# Patient Record
Sex: Female | Born: 1980 | Race: Black or African American | Hispanic: No | Marital: Married | Smoking: Never smoker
Health system: Southern US, Community
[De-identification: ages and names within clinical notes are randomized; demographics above are authoritative.]

## PROBLEM LIST (undated history)

## (undated) ENCOUNTER — Inpatient Hospital Stay (HOSPITAL_COMMUNITY): Payer: Self-pay

## (undated) DIAGNOSIS — D649 Anemia, unspecified: Secondary | ICD-10-CM

## (undated) DIAGNOSIS — K831 Obstruction of bile duct: Secondary | ICD-10-CM

## (undated) HISTORY — PX: BREAST BIOPSY: SHX20

## (undated) HISTORY — PX: WISDOM TOOTH EXTRACTION: SHX21

## (undated) HISTORY — DX: Anemia, unspecified: D64.9

## (undated) HISTORY — PX: INDUCED ABORTION: SHX677

---
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
## 2003-01-23 ENCOUNTER — Inpatient Hospital Stay (HOSPITAL_COMMUNITY): Admission: AD | Admit: 2003-01-23 | Discharge: 2003-01-23 | Payer: Self-pay | Admitting: Obstetrics

## 2003-03-23 ENCOUNTER — Inpatient Hospital Stay (HOSPITAL_COMMUNITY): Admission: AD | Admit: 2003-03-23 | Discharge: 2003-03-23 | Payer: Self-pay | Admitting: Obstetrics

## 2003-03-23 ENCOUNTER — Encounter: Payer: Self-pay | Admitting: Obstetrics & Gynecology

## 2003-03-30 ENCOUNTER — Inpatient Hospital Stay (HOSPITAL_COMMUNITY): Admission: AD | Admit: 2003-03-30 | Discharge: 2003-04-01 | Payer: Self-pay | Admitting: Obstetrics

## 2003-04-16 ENCOUNTER — Encounter: Admission: RE | Admit: 2003-04-16 | Discharge: 2003-05-16 | Payer: Self-pay | Admitting: Obstetrics

## 2003-06-11 ENCOUNTER — Emergency Department (HOSPITAL_COMMUNITY): Admission: EM | Admit: 2003-06-11 | Discharge: 2003-06-11 | Payer: Self-pay | Admitting: Emergency Medicine

## 2006-04-07 ENCOUNTER — Ambulatory Visit: Payer: Self-pay | Admitting: Family Medicine

## 2006-04-21 ENCOUNTER — Other Ambulatory Visit: Admission: RE | Admit: 2006-04-21 | Discharge: 2006-04-21 | Payer: Self-pay | Admitting: Family Medicine

## 2006-04-21 ENCOUNTER — Ambulatory Visit: Payer: Self-pay | Admitting: Family Medicine

## 2006-04-21 ENCOUNTER — Encounter (INDEPENDENT_AMBULATORY_CARE_PROVIDER_SITE_OTHER): Payer: Self-pay | Admitting: Family Medicine

## 2006-06-06 ENCOUNTER — Ambulatory Visit: Payer: Self-pay | Admitting: Family Medicine

## 2008-12-15 ENCOUNTER — Emergency Department (HOSPITAL_BASED_OUTPATIENT_CLINIC_OR_DEPARTMENT_OTHER): Admission: EM | Admit: 2008-12-15 | Discharge: 2008-12-15 | Payer: Self-pay | Admitting: Emergency Medicine

## 2009-05-16 ENCOUNTER — Emergency Department (HOSPITAL_COMMUNITY): Admission: EM | Admit: 2009-05-16 | Discharge: 2009-05-16 | Payer: Self-pay | Admitting: Emergency Medicine

## 2009-07-17 ENCOUNTER — Ambulatory Visit: Payer: Self-pay | Admitting: Family Medicine

## 2009-12-25 ENCOUNTER — Emergency Department (HOSPITAL_COMMUNITY): Admission: EM | Admit: 2009-12-25 | Discharge: 2009-12-25 | Payer: Self-pay | Admitting: Emergency Medicine

## 2010-01-26 ENCOUNTER — Emergency Department (HOSPITAL_COMMUNITY): Admission: EM | Admit: 2010-01-26 | Discharge: 2010-01-26 | Payer: Self-pay | Admitting: Family Medicine

## 2010-03-09 ENCOUNTER — Emergency Department (HOSPITAL_COMMUNITY): Admission: EM | Admit: 2010-03-09 | Discharge: 2010-03-09 | Payer: Self-pay | Admitting: Emergency Medicine

## 2010-09-15 ENCOUNTER — Emergency Department (HOSPITAL_COMMUNITY)
Admission: EM | Admit: 2010-09-15 | Discharge: 2010-09-15 | Disposition: A | Discharge status: Home / Self Care | Payer: Self-pay | Attending: Emergency Medicine | Admitting: Emergency Medicine

## 2010-09-15 ENCOUNTER — Emergency Department (HOSPITAL_COMMUNITY): Payer: Self-pay

## 2010-09-15 DIAGNOSIS — M79609 Pain in unspecified limb: Secondary | ICD-10-CM | POA: Insufficient documentation

## 2010-09-15 DIAGNOSIS — IMO0002 Reserved for concepts with insufficient information to code with codable children: Secondary | ICD-10-CM | POA: Insufficient documentation

## 2010-09-15 IMAGING — CR DG FOOT COMPLETE 3+V*R*
3 series · 3 of 3 positions shown · non-contrast
Comparison: None.

CLINICAL DATA: Chronic right foot pain.  Heel pain.

RIGHT FOOT COMPLETE - 3+ VIEW

[t foot ap right]
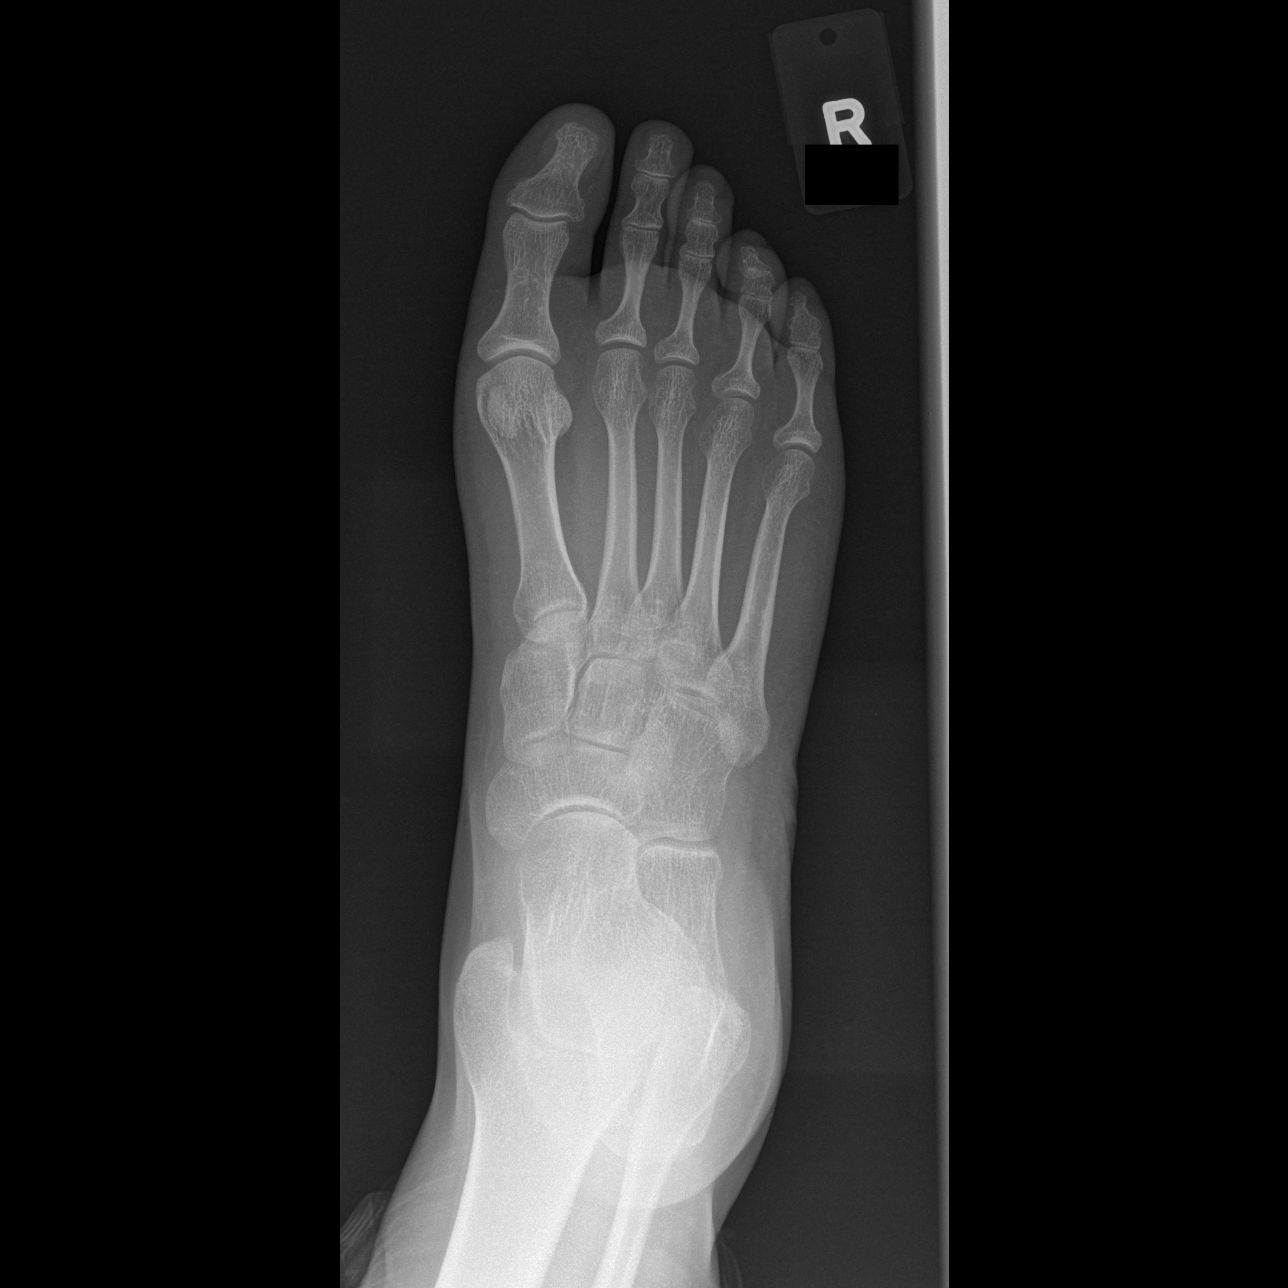

[t foot oblique right]
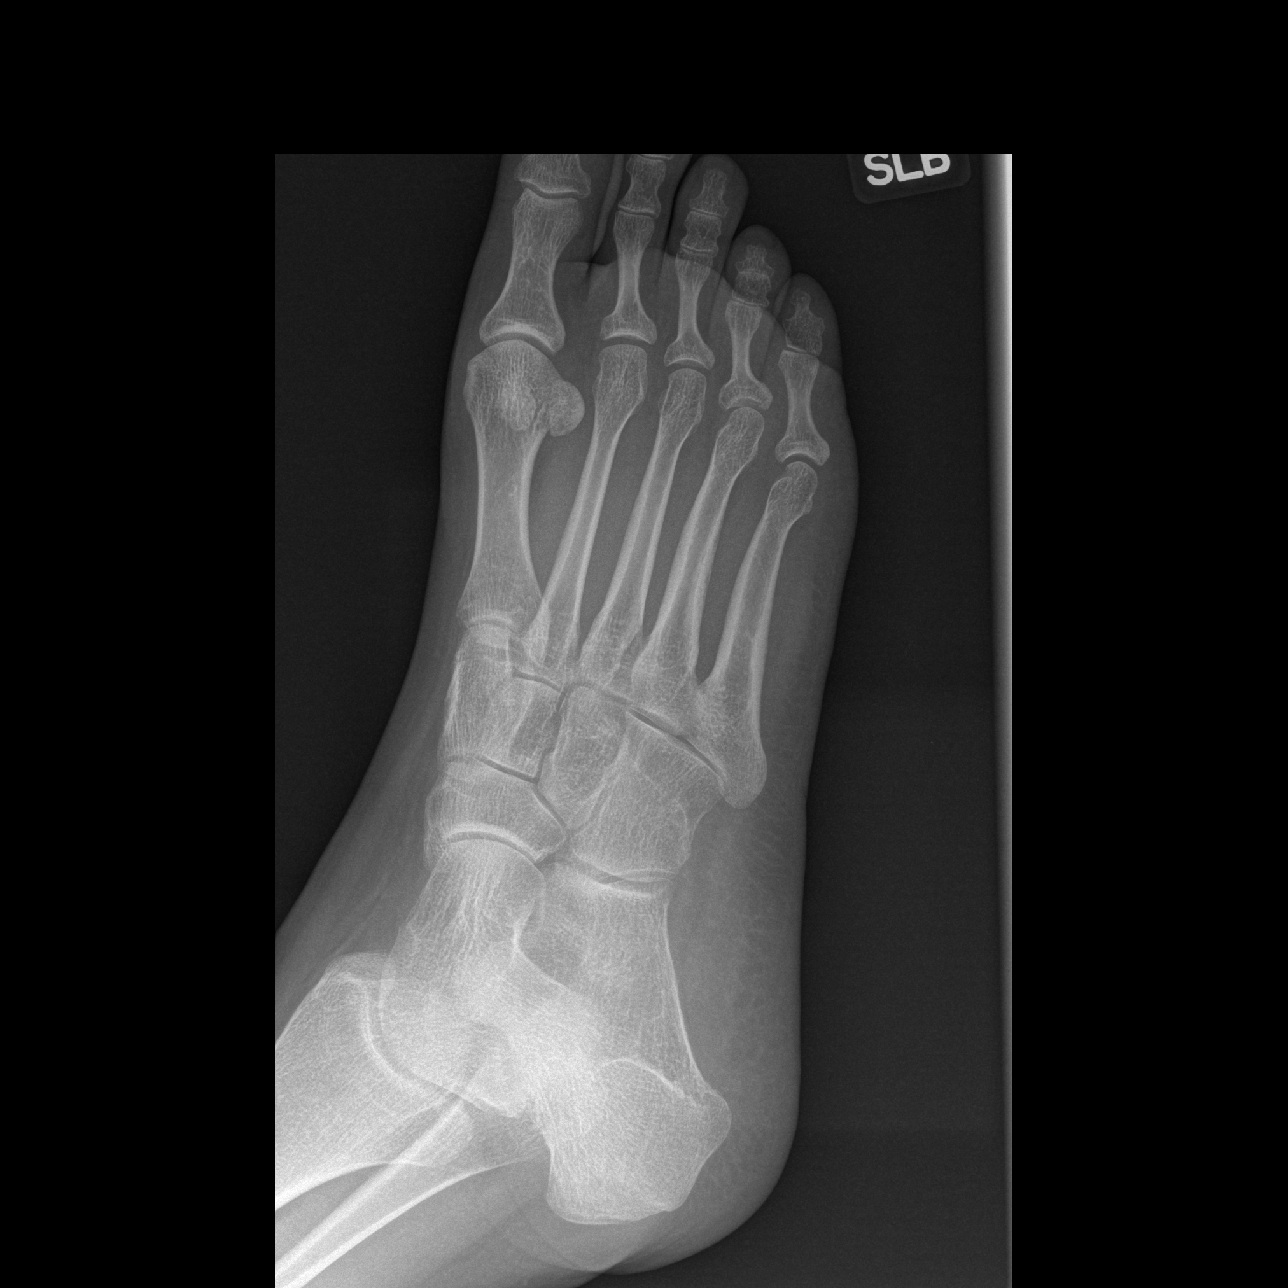

[t foot lat right]
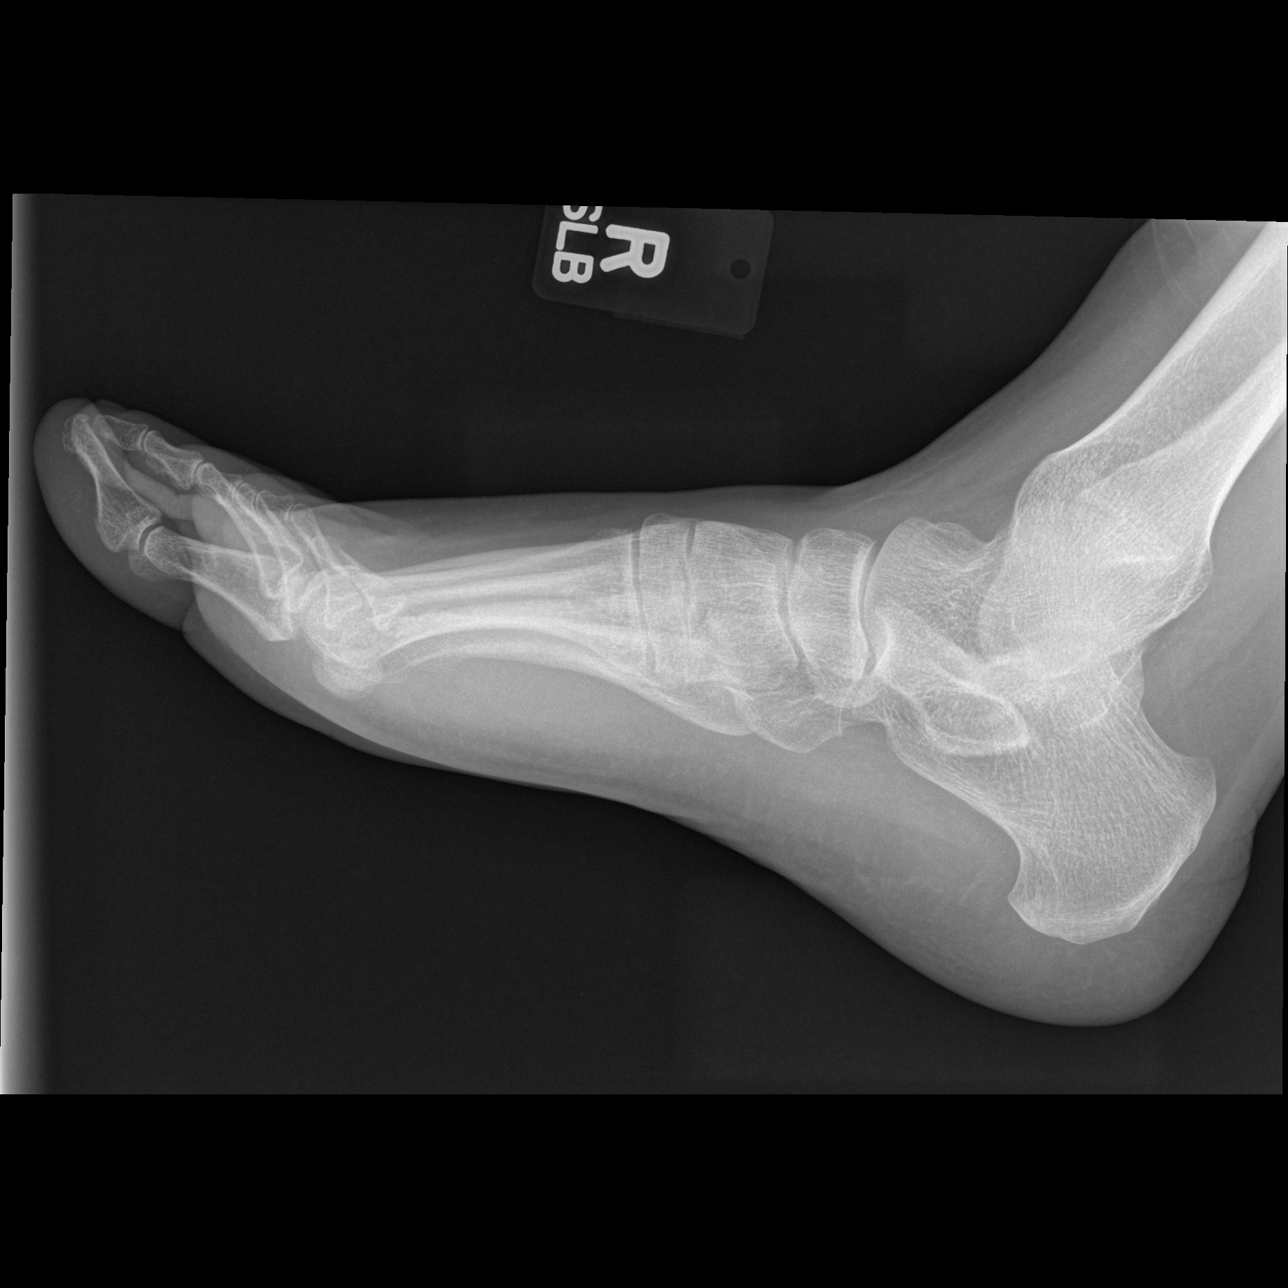

[3 of 3 positions shown; findings below may reference images not displayed]

FINDINGS: No acute osseous, soft tissue or joint abnormality.  No
radiopaque foreign body.
IMPRESSION: No acute findings.

## 2010-09-16 LAB — CBC
Platelets: 333 10*3/uL (ref 150–400)
RDW: 15.1 % (ref 11.5–15.5)
WBC: 11.5 10*3/uL — ABNORMAL HIGH (ref 4.0–10.5)

## 2010-09-16 LAB — URINALYSIS, ROUTINE W REFLEX MICROSCOPIC
Bilirubin Urine: NEGATIVE
Glucose, UA: NEGATIVE mg/dL
Ketones, ur: 15 mg/dL — AB
Leukocytes, UA: NEGATIVE
Nitrite: NEGATIVE
Protein, ur: NEGATIVE mg/dL
Specific Gravity, Urine: 1.03 (ref 1.005–1.030)
Urobilinogen, UA: 0.2 mg/dL (ref 0.0–1.0)
pH: 6 (ref 5.0–8.0)

## 2010-09-16 LAB — URINE MICROSCOPIC-ADD ON

## 2010-09-16 LAB — POCT I-STAT, CHEM 8
BUN: 9 mg/dL (ref 6–23)
Chloride: 103 mEq/L (ref 96–112)
Sodium: 140 mEq/L (ref 135–145)

## 2010-09-16 LAB — POCT PREGNANCY, URINE: Preg Test, Ur: NEGATIVE

## 2010-09-16 LAB — DIFFERENTIAL
Basophils Absolute: 0 10*3/uL (ref 0.0–0.1)
Lymphocytes Relative: 16 % (ref 12–46)
Neutro Abs: 8.7 10*3/uL — ABNORMAL HIGH (ref 1.7–7.7)
Neutrophils Relative %: 76 % (ref 43–77)

## 2010-09-19 LAB — RAPID STREP SCREEN (MED CTR MEBANE ONLY): Streptococcus, Group A Screen (Direct): POSITIVE — AB

## 2011-02-14 ENCOUNTER — Emergency Department (HOSPITAL_COMMUNITY)
Admission: EM | Admit: 2011-02-14 | Discharge: 2011-02-14 | Disposition: A | Discharge status: Home / Self Care | Payer: Self-pay | Attending: Emergency Medicine | Admitting: Emergency Medicine

## 2011-02-14 DIAGNOSIS — R51 Headache: Secondary | ICD-10-CM | POA: Insufficient documentation

## 2011-02-14 DIAGNOSIS — R42 Dizziness and giddiness: Secondary | ICD-10-CM | POA: Insufficient documentation

## 2011-02-14 LAB — URINALYSIS, ROUTINE W REFLEX MICROSCOPIC
Ketones, ur: NEGATIVE mg/dL
Leukocytes, UA: NEGATIVE
Nitrite: NEGATIVE
Specific Gravity, Urine: 1.017 (ref 1.005–1.030)
Urobilinogen, UA: 0.2 mg/dL (ref 0.0–1.0)
pH: 7.5 (ref 5.0–8.0)

## 2011-02-14 LAB — POCT I-STAT, CHEM 8
Calcium, Ion: 1.19 mmol/L (ref 1.12–1.32)
Chloride: 107 mEq/L (ref 96–112)
HCT: 36 % (ref 36.0–46.0)
Hemoglobin: 12.2 g/dL (ref 12.0–15.0)
Potassium: 3.7 mEq/L (ref 3.5–5.1)

## 2011-02-14 LAB — URINE MICROSCOPIC-ADD ON

## 2011-02-14 LAB — POCT PREGNANCY, URINE: Preg Test, Ur: NEGATIVE

## 2011-06-03 DIAGNOSIS — R071 Chest pain on breathing: Secondary | ICD-10-CM | POA: Insufficient documentation

## 2011-06-03 DIAGNOSIS — R079 Chest pain, unspecified: Secondary | ICD-10-CM | POA: Insufficient documentation

## 2011-06-04 ENCOUNTER — Emergency Department (HOSPITAL_COMMUNITY): Payer: Self-pay

## 2011-06-04 ENCOUNTER — Emergency Department (HOSPITAL_COMMUNITY)
Admission: EM | Admit: 2011-06-04 | Discharge: 2011-06-04 | Disposition: A | Discharge status: Home / Self Care | Payer: Self-pay | Attending: Emergency Medicine | Admitting: Emergency Medicine

## 2011-06-04 ENCOUNTER — Encounter: Payer: Self-pay | Admitting: *Deleted

## 2011-06-04 DIAGNOSIS — R0789 Other chest pain: Secondary | ICD-10-CM

## 2011-06-04 IMAGING — CR DG CHEST 2V
2 series · 2 of 2 positions shown · non-contrast
Comparison: None.

CLINICAL DATA: Mid and left-sided chest pain for 1 month.

CHEST - 2 VIEW

[w chest pa]
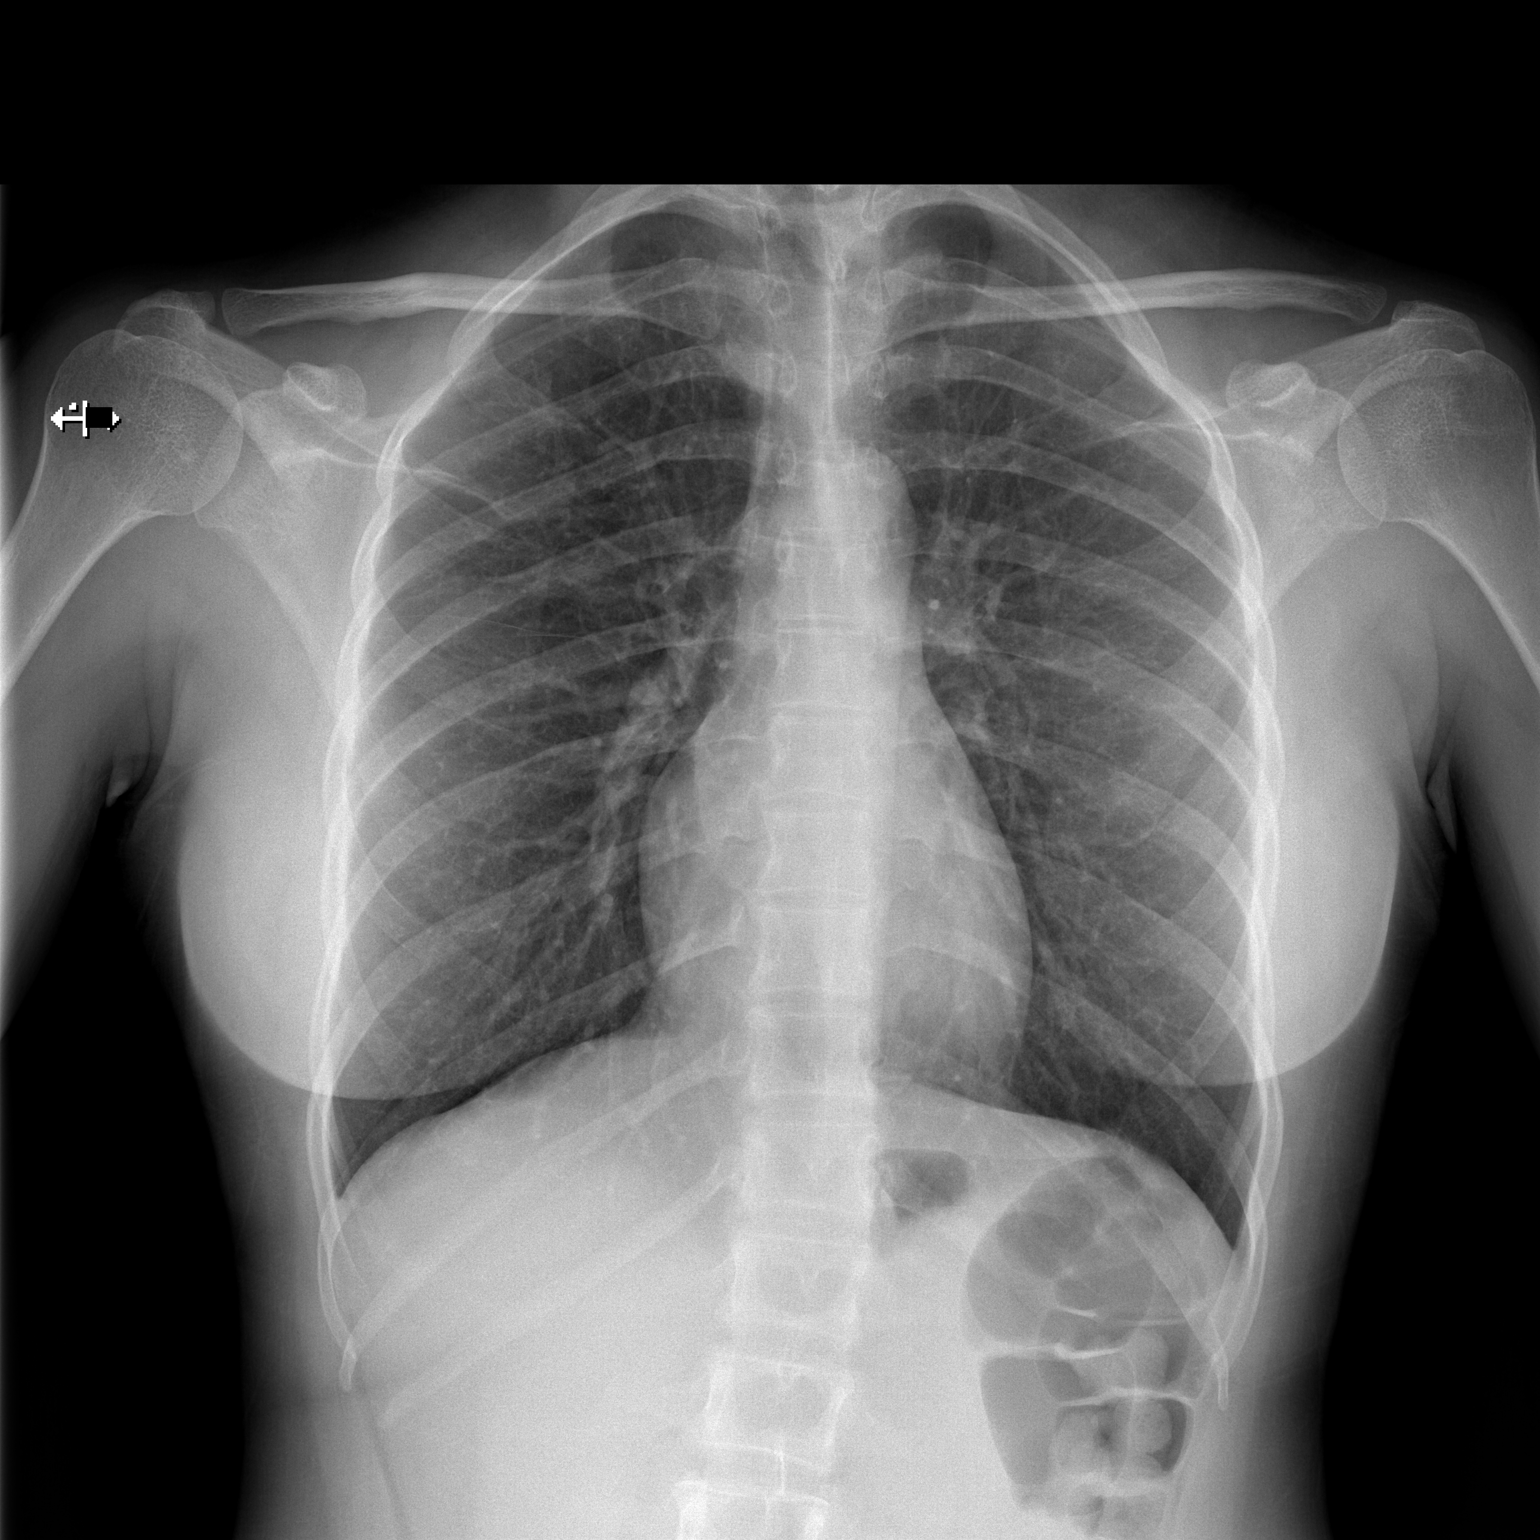

[w chest lat]
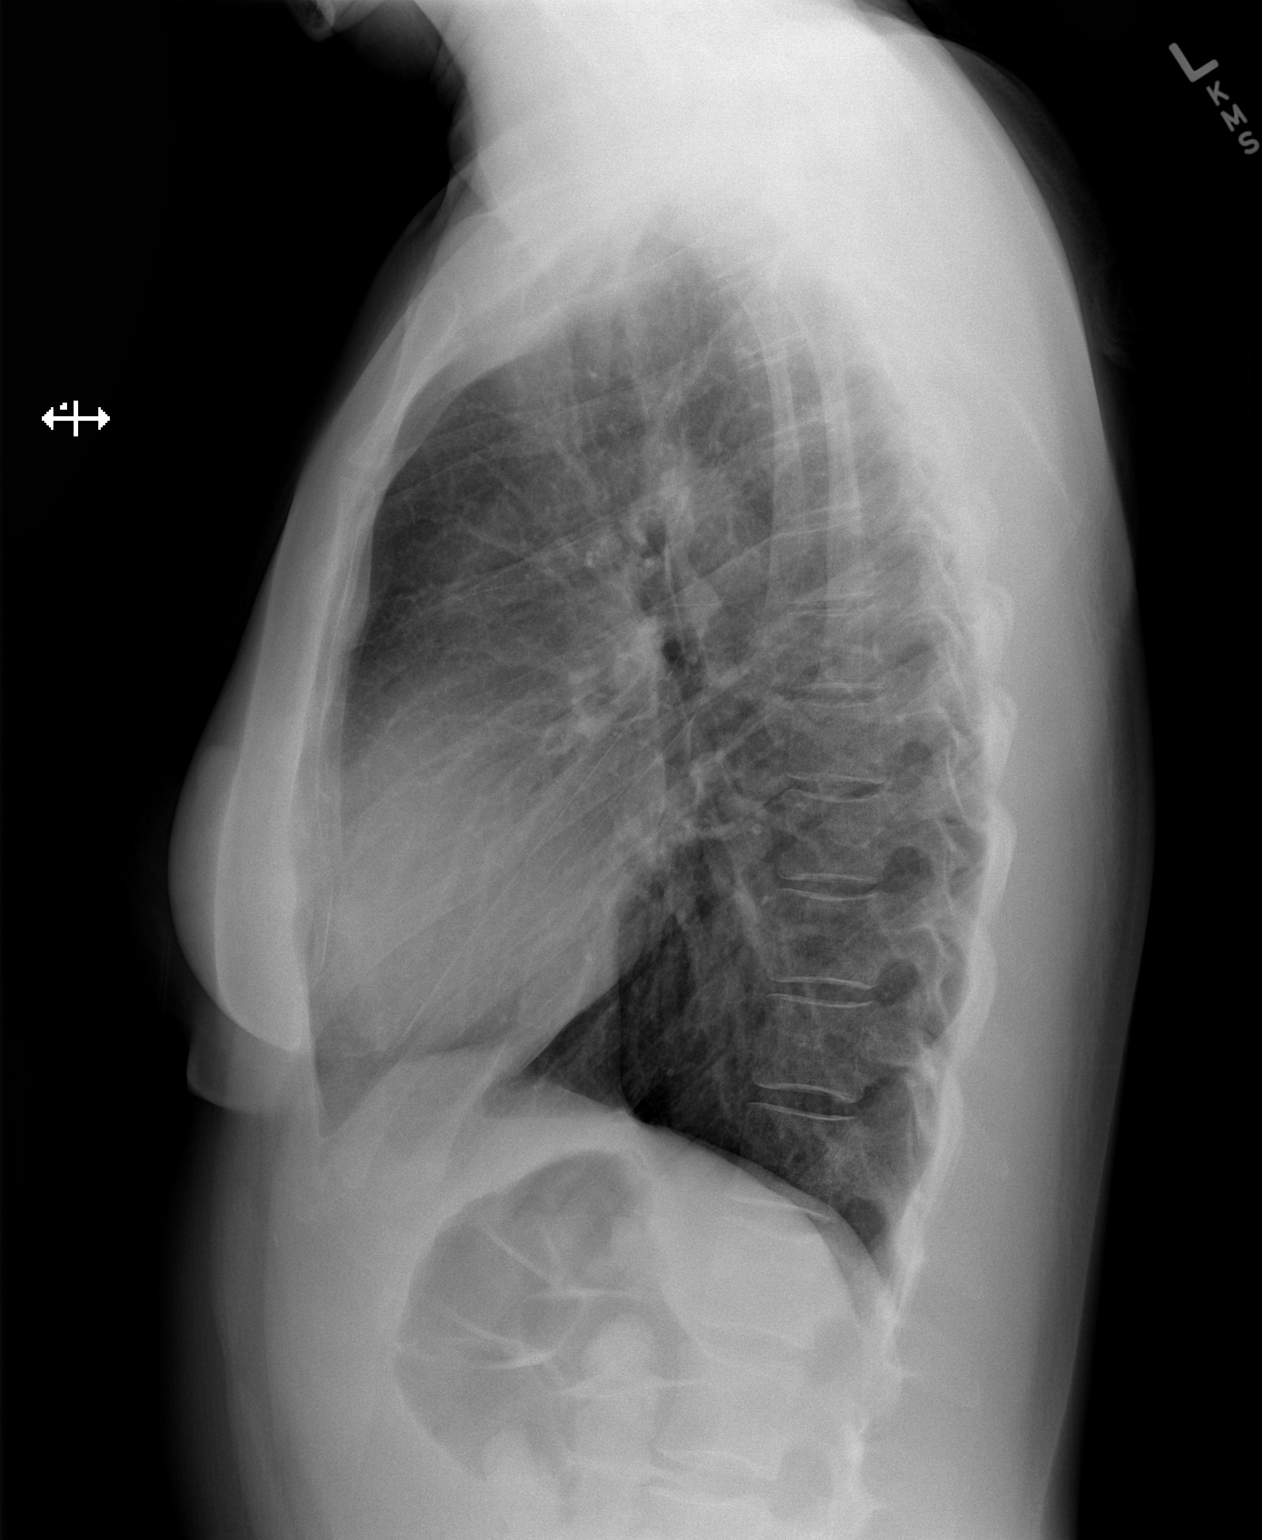

[2 of 2 positions shown; findings below may reference images not displayed]

FINDINGS: The lungs are well-aerated and clear.  There is no
evidence of focal opacification, pleural effusion or pneumothorax.

The heart is normal in size; the mediastinal contour is within
normal limits.  No acute osseous abnormalities are seen.
IMPRESSION: No acute cardiopulmonary process seen.

## 2011-06-04 MED ORDER — OMEPRAZOLE 20 MG PO CPDR: DELAYED_RELEASE_CAPSULE | ORAL | Status: DC

## 2011-06-04 NOTE — ED Notes (Signed)
PT c/o Chest pain starting x 1 mon ago, intermittant, worse when lying down, denies any other problems.

## 2011-06-04 NOTE — ED Provider Notes (Signed)
History     CSN: [id number] Arrival date & time: 06/04/2011 12:39 AM   First MD Initiated Contact with Patient 06/04/11 0125      Chief Complaint  Patient presents with  . Chest Pain    Pt states sharp, in no acute distress at this time, pain free at this time, states x 1 month of intermittant CP, worse when lying down.    (Consider location/radiation/quality/duration/timing/severity/associated sxs/prior treatment) Patient is a 30 y.o. female presenting with chest pain. The history is provided by the patient.  Chest Pain The chest pain began more  than 1 month ago. Duration of episode(s) is 3 seconds. Chest pain occurs frequently. The chest pain is unchanged. The quality of the pain is described as sharp. The pain does not radiate. Pertinent negatives for primary symptoms include no fever. Primary symptoms comment: No fever, cough or known injury. It is not associated with eating. She does not currently have pain.     History reviewed. No pertinent past medical history.  Past Surgical History  Procedure Date  . Breast biopsy     Fibroid tissue    History reviewed. No pertinent family history.  History  Substance Use Topics  . Smoking status: Former Games developer  . Smokeless tobacco: Not on file  . Alcohol Use: No    OB History    Grav Para Term Preterm Abortions TAB SAB Ect Mult Living                  Review of Systems  Constitutional: Negative.  Negative for fever and chills.  HENT: Negative.   Respiratory: Negative.   Cardiovascular: Positive for chest pain.  Gastrointestinal: Negative.   Musculoskeletal: Negative.   Skin: Negative.   Neurological: Negative.     Allergies  Review of patient's allergies indicates no known allergies.  Home Medications  No current outpatient prescriptions on file.  BP 103/73  Pulse 77  Temp(Src) 98 F (36.7 C) (Oral)  Resp 16  SpO2 100%  LMP 05/30/2011  Physical Exam  Constitutional: She appears well-developed and  well-nourished.  HENT:  Head: Normocephalic.  Neck: Normal range of motion. Neck supple.  Cardiovascular: Normal rate and regular rhythm.   Pulmonary/Chest: Effort normal and breath sounds normal. She exhibits no tenderness.  Abdominal: Soft. Bowel sounds are normal. There is no tenderness. There is no rebound and no guarding.  Musculoskeletal: Normal range of motion.  Neurological: She is alert. No cranial nerve deficit.  Skin: Skin is warm and dry. No rash noted.  Psychiatric: She has a normal mood and affect.    ED Course  Procedures (including critical care time)  Labs Reviewed - No data to display No results found.   No diagnosis found.    MDM          Rodena Medin, PA 06/04/11 [phone]

## 2011-06-05 NOTE — ED Provider Notes (Signed)
Medical screening examination/treatment/procedure(s) were performed by non-physician practitioner and as supervising physician I was immediately available for consultation/collaboration.   Gerhard Munch, MD 06/05/11 [phone]

## 2012-08-24 ENCOUNTER — Emergency Department (HOSPITAL_COMMUNITY)
Admission: EM | Admit: 2012-08-24 | Discharge: 2012-08-24 | Disposition: A | Discharge status: Home / Self Care | Payer: Self-pay | Attending: Emergency Medicine | Admitting: Emergency Medicine

## 2012-08-24 ENCOUNTER — Encounter (HOSPITAL_COMMUNITY): Payer: Self-pay | Admitting: *Deleted

## 2012-08-24 DIAGNOSIS — H9209 Otalgia, unspecified ear: Secondary | ICD-10-CM | POA: Insufficient documentation

## 2012-08-24 DIAGNOSIS — R05 Cough: Secondary | ICD-10-CM | POA: Insufficient documentation

## 2012-08-24 DIAGNOSIS — R059 Cough, unspecified: Secondary | ICD-10-CM | POA: Insufficient documentation

## 2012-08-24 DIAGNOSIS — J029 Acute pharyngitis, unspecified: Secondary | ICD-10-CM | POA: Insufficient documentation

## 2012-08-24 DIAGNOSIS — Z87891 Personal history of nicotine dependence: Secondary | ICD-10-CM | POA: Insufficient documentation

## 2012-08-24 MED ORDER — DEXAMETHASONE 4 MG PO TABS
8.0000 mg | ORAL_TABLET | Freq: Once | ORAL | Status: AC
  Administered 2012-08-24: 8 mg via ORAL
  Filled 2012-08-24: qty 2

## 2012-08-24 MED ORDER — OXYCODONE-ACETAMINOPHEN 5-325 MG PO TABS: 1.0000 | ORAL_TABLET | ORAL | Status: DC | PRN

## 2012-08-24 MED ORDER — NAPROXEN 250 MG PO TABS: 250.0000 mg | ORAL_TABLET | Freq: Three times a day (TID) | ORAL | Status: DC | PRN

## 2012-08-24 NOTE — ED Notes (Addendum)
Pt reports sore throat x3 days, pain 7/10, reports white patches on back of throat. Has tonsils present. Pt reports she gets strep every year. Reports dry non productive cough x1 day. Nasal congestion starting. Reports tired and "drained, cant get enough rest".

## 2012-08-24 NOTE — ED Notes (Signed)
md at bedside  Pt alert and oriented x4. Respirations even and unlabored, bilateral symmetrical rise and fall of chest. Skin warm and dry. In no acute distress. Denies needs.   

## 2012-08-24 NOTE — ED Notes (Signed)
Pt escorted to discharge window. Pt verbalized understanding discharge instructions. In no acute distress.  

## 2012-08-27 NOTE — ED Provider Notes (Signed)
History    31yf with sore throat. Gradual onset about 3d ago. Relatively constant. Worse with swallowing. No fever or chills. B/l ear pain. No drainage. occasional non productive cough. No rash. Has tried taking motrin with mild relief. No cp or sob. No n/v.  CSN: [id number]  Arrival date & time 08/24/12  0803   First MD Initiated Contact with Patient 08/24/12 [phone]      Chief Complaint  Patient presents with  . Sore Throat    (Consider location/radiation/quality/duration/timing/severity/associated sxs/prior treatment) HPI  History reviewed. No pertinent past medical history.  Past Surgical History  Procedure Laterality Date  . Breast biopsy      Fibroid tissue 2003    History reviewed. No pertinent family history.  History  Substance Use Topics  . Smoking status: Former Games developer  . Smokeless tobacco: Not on file  . Alcohol Use: Yes     Comment: occasionally glass of wine    OB History   Grav Para Term Preterm Abortions TAB SAB Ect Mult Living                  Review of Systems  All systems reviewed and negative, other than as noted in HPI.   Allergies  Review of patient's allergies indicates no known allergies.  Home Medications   Current Outpatient Rx  Name  Route  Sig  Dispense  Refill  . naproxen (NAPROSYN) 250 MG tablet   Oral   Take 1 tablet (250 mg total) by mouth 3 (three) times daily as needed.   20 tablet   0   . omeprazole (PRILOSEC) 20 MG capsule      One twice daily for 3 days, then once daily for 7 days.   15 capsule   0   . oxyCODONE-acetaminophen (PERCOCET/ROXICET) 5-325 MG per tablet   Oral   Take 1 tablet by mouth every 4 (four) hours as needed for pain.   10 tablet   0     BP 111/73  Pulse 97  Temp(Src) 98.4 F (36.9 C) (Oral)  Resp 16  SpO2 99%  LMP 08/20/2012  Physical Exam  Nursing note and vitals reviewed. Constitutional: She appears well-developed and well-nourished. No distress.  HENT:  Head: Normocephalic and  atraumatic.  Right Ear: External ear normal.  Left Ear: External ear normal.  Mouth/Throat: Oropharyngeal exudate present.  Posterior pharynx with exudate. Uvula midline. Handling secretions. Normal sounding phonation. Tongue elevation. Submental tissue soft. Neck is supple. No adenopathy. No stridor.  Eyes: Conjunctivae are normal. Right eye exhibits no discharge. Left eye exhibits no discharge.  Neck: Neck supple.  Cardiovascular: Normal rate, regular rhythm and normal heart sounds.  Exam reveals no gallop and no friction rub.   No murmur heard. Pulmonary/Chest: Effort normal and breath sounds normal. No respiratory distress.  Abdominal: Soft. She exhibits no distension. There is no tenderness.  Musculoskeletal: She exhibits no edema and no tenderness.  Neurological: She is alert.  Skin: Skin is warm and dry.  Psychiatric: She has a normal mood and affect. Her behavior is normal. Thought content normal.    ED Course  Procedures (including critical care time)  Labs Reviewed - No data to display No results found.   1. Pharyngitis       MDM  31yF with sore throat. Likely viral pharyngitis. 1/4 centor criteria. Well appearing. No evidence of deep space head/neck infection. Plan symptomatic tx. Emergent return precautions discussed.         Raeford Razor,  MD 08/27/12 1532

## 2012-11-21 ENCOUNTER — Encounter (HOSPITAL_COMMUNITY): Payer: Self-pay | Admitting: *Deleted

## 2012-11-21 ENCOUNTER — Emergency Department (HOSPITAL_COMMUNITY)
Admission: EM | Admit: 2012-11-21 | Discharge: 2012-11-21 | Disposition: A | Discharge status: Home / Self Care | Payer: Self-pay | Attending: Emergency Medicine | Admitting: Emergency Medicine

## 2012-11-21 DIAGNOSIS — R5381 Other malaise: Secondary | ICD-10-CM | POA: Insufficient documentation

## 2012-11-21 DIAGNOSIS — Z3202 Encounter for pregnancy test, result negative: Secondary | ICD-10-CM | POA: Insufficient documentation

## 2012-11-21 DIAGNOSIS — R0602 Shortness of breath: Secondary | ICD-10-CM | POA: Insufficient documentation

## 2012-11-21 DIAGNOSIS — R110 Nausea: Secondary | ICD-10-CM | POA: Insufficient documentation

## 2012-11-21 DIAGNOSIS — Z87891 Personal history of nicotine dependence: Secondary | ICD-10-CM | POA: Insufficient documentation

## 2012-11-21 DIAGNOSIS — E860 Dehydration: Secondary | ICD-10-CM | POA: Insufficient documentation

## 2012-11-21 DIAGNOSIS — R531 Weakness: Secondary | ICD-10-CM

## 2012-11-21 DIAGNOSIS — R002 Palpitations: Secondary | ICD-10-CM | POA: Insufficient documentation

## 2012-11-21 DIAGNOSIS — R55 Syncope and collapse: Secondary | ICD-10-CM | POA: Insufficient documentation

## 2012-11-21 LAB — BASIC METABOLIC PANEL
BUN: 10 mg/dL (ref 6–23)
Calcium: 9.7 mg/dL (ref 8.4–10.5)
Creatinine, Ser: 0.78 mg/dL (ref 0.50–1.10)
GFR calc Af Amer: >90 mL/min (ref >90)
GFR calc non Af Amer: >90 mL/min (ref >90)

## 2012-11-21 LAB — URINALYSIS, ROUTINE W REFLEX MICROSCOPIC
Bilirubin Urine: NEGATIVE
Ketones, ur: NEGATIVE mg/dL
Leukocytes, UA: NEGATIVE
Nitrite: NEGATIVE
Protein, ur: NEGATIVE mg/dL

## 2012-11-21 LAB — CBC WITH DIFFERENTIAL/PLATELET
Basophils Absolute: 0 10*3/uL (ref 0.0–0.1)
Basophils Relative: 0 % (ref 0–1)
Eosinophils Absolute: 0.2 10*3/uL (ref 0.0–0.7)
Eosinophils Relative: 2 % (ref 0–5)
HCT: 36.8 % (ref 36.0–46.0)
MCH: 28.8 pg (ref 26.0–34.0)
MCHC: 32.6 g/dL (ref 30.0–36.0)
MCV: 88.2 fL (ref 78.0–100.0)
Monocytes Absolute: 0.9 10*3/uL (ref 0.1–1.0)
Monocytes Relative: 9 % (ref 3–12)
Neutro Abs: 5.5 10*3/uL (ref 1.7–7.7)
RDW: 14.5 % (ref 11.5–15.5)

## 2012-11-21 MED ORDER — METOCLOPRAMIDE HCL 10 MG PO TABS: 10.0000 mg | ORAL_TABLET | Freq: Four times a day (QID) | ORAL | Status: DC | PRN

## 2012-11-21 MED ORDER — SODIUM CHLORIDE 0.9 % IV BOLUS (SEPSIS)
2000.0000 mL | Freq: Once | INTRAVENOUS | Status: AC
  Administered 2012-11-21: 2000 mL via INTRAVENOUS

## 2012-11-21 MED ORDER — METOCLOPRAMIDE HCL 5 MG/ML IJ SOLN
10.0000 mg | Freq: Once | INTRAMUSCULAR | Status: AC
  Administered 2012-11-21: 10 mg via INTRAVENOUS
  Filled 2012-11-21: qty 2

## 2012-11-21 MED ORDER — SODIUM CHLORIDE 0.9 % IV BOLUS (SEPSIS)
2000.0000 mL | Freq: Once | INTRAVENOUS | Status: DC
Start: 2012-11-21 — End: 2012-11-21

## 2012-11-21 NOTE — ED Notes (Signed)
Patient stating she felt a "rush" after Reglan, pt HR 88, 100% on RA.  Pt stated the feeling quickly left.  No other complaints at this time

## 2012-11-21 NOTE — ED Notes (Signed)
Pt states she still feels dizziness and that her heart is beating fast when she gets "up and moving." Pt a/o x 4. No acute distress. Pt denies chest pain, nausea or shortness of breath. Pt states she drove herself here.

## 2012-11-21 NOTE — ED Provider Notes (Signed)
History     CSN: [id number]  Arrival date & time 11/21/12  1647   First MD Initiated Contact with Patient 11/21/12 1903      Chief Complaint  Patient presents with  . Fatigue  . Near Syncope  . Palpitations  . Shortness of Breath    (Consider location/radiation/quality/duration/timing/severity/associated sxs/prior treatment) The history is provided by the patient.  Sarah Parks is a 32 y.o. female otherwise healthy here presenting with fatigue and palpitations and shortness of breath. She's been feeling tired for about 2 weeks. 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 He just feels that she's been more tired while at work. She also feels some palpitations and feelings that she had gone passed out when she stands up. Feels nauseous but did not vomit, denies any fevers or chills or abdominal pain. No cardiac history.    History reviewed. No pertinent past medical history.  Past Surgical History  Procedure Laterality Date  . Breast biopsy      Fibroid tissue 2003    History reviewed. No pertinent family history.  History  Substance Use Topics  . Smoking status: Former Smoker    Quit date: 11/22/2007  . Smokeless tobacco: Never Used  . Alcohol Use: Yes     Comment: occasionally glass of wine    OB History   Grav Para Term Preterm Abortions TAB SAB Ect Mult Living                  Review of Systems  Respiratory: Positive for shortness of breath.   Cardiovascular: Positive for palpitations.  All other systems reviewed and are negative.    Allergies  Review of patient's allergies indicates no known allergies.  Home Medications  No current outpatient prescriptions on file.  BP 108/75  Pulse 97  Temp(Src) 98.5 F (36.9 C) (Oral)  Resp 18  SpO2 100%  LMP 11/07/2012  Physical Exam  Nursing note and vitals reviewed. Constitutional: She is oriented to person, place, and time. She appears well-developed and well-nourished.   HENT:  Head: Normocephalic.  Mouth/Throat: Oropharynx is clear and moist.  Eyes: Conjunctivae are normal. Pupils are equal, round, and reactive to light.  Neck: Normal range of motion. Neck supple.  Cardiovascular: Normal rate, regular rhythm and normal heart sounds.   Pulmonary/Chest: Effort normal and breath sounds normal. No respiratory distress. She has no wheezes. She has no rales.  Abdominal: Soft. Bowel sounds are normal. She exhibits no distension. There is no tenderness. There is no rebound and no guarding.  Musculoskeletal: Normal range of motion.  Neurological: She is alert and oriented to person, place, and time.  Skin: Skin is warm and dry.  Psychiatric: She has a normal mood and affect. Her behavior is normal. Judgment and thought content normal.    ED Course  Procedures (including critical care time)  Labs Reviewed  URINALYSIS, ROUTINE W REFLEX MICROSCOPIC - Abnormal; Notable for the following:    Specific Gravity, Urine 1.031 (*)    All other components within normal limits  CBC WITH DIFFERENTIAL  BASIC METABOLIC PANEL  PREGNANCY, URINE  HCG, QUANTITATIVE, PREGNANCY   No results found.   No diagnosis found.   Date: 11/21/2012  Rate: 83  Rhythm: normal sinus rhythm  QRS Axis: normal  Intervals: normal  ST/T Wave abnormalities: normal  Conduction Disutrbances:none  Narrative Interpretation:   Old EKG Reviewed: none available    MDM  [name] is a  32 y.o. female here with fatigue, palpitations, near syncope. Will need to r/o pregnancy vs dehydration. She is more stress out than usual so can be stress related. Labs unremarkable. Will get orthostatic and hydrate patient. I am not concerned for ACS or cardiac causes.   9:40 PM Patient not orthostatic. Labs unremarkable. UA concentrated so she may be dehydrated. Patient given 2 L NS and reglan and felt better. I told her she should stay hydrated. I recommend that she f/u with a doctor and get thyroid  test outpatient if not feeling better.          Richardean Canal, MD 11/21/12 2141

## 2012-11-21 NOTE — ED Notes (Signed)
Pt from home with reports of taking a 6 mile walk 1 1/2 weeks ago and began having fatigue, palpitations, shortness of breath and feelings of near syncope. Pt denies fever, N/V/D.

## 2014-04-09 ENCOUNTER — Inpatient Hospital Stay (HOSPITAL_COMMUNITY)
Admission: AD | Admit: 2014-04-09 | Discharge: 2014-04-09 | Disposition: A | Discharge status: Home / Self Care | Payer: Medicaid Other | Source: Ambulatory Visit | Attending: Obstetrics & Gynecology | Admitting: Obstetrics & Gynecology

## 2014-04-09 ENCOUNTER — Inpatient Hospital Stay (HOSPITAL_COMMUNITY): Payer: Medicaid Other

## 2014-04-09 ENCOUNTER — Encounter (HOSPITAL_COMMUNITY): Payer: Self-pay

## 2014-04-09 DIAGNOSIS — R109 Unspecified abdominal pain: Secondary | ICD-10-CM

## 2014-04-09 DIAGNOSIS — Z3A01 Less than 8 weeks gestation of pregnancy: Secondary | ICD-10-CM | POA: Diagnosis not present

## 2014-04-09 DIAGNOSIS — O210 Mild hyperemesis gravidarum: Secondary | ICD-10-CM | POA: Insufficient documentation

## 2014-04-09 DIAGNOSIS — O219 Vomiting of pregnancy, unspecified: Secondary | ICD-10-CM

## 2014-04-09 DIAGNOSIS — Z87891 Personal history of nicotine dependence: Secondary | ICD-10-CM | POA: Diagnosis not present

## 2014-04-09 DIAGNOSIS — O26899 Other specified pregnancy related conditions, unspecified trimester: Secondary | ICD-10-CM

## 2014-04-09 DIAGNOSIS — O9989 Other specified diseases and conditions complicating pregnancy, childbirth and the puerperium: Secondary | ICD-10-CM

## 2014-04-09 LAB — URINALYSIS, ROUTINE W REFLEX MICROSCOPIC
BILIRUBIN URINE: NEGATIVE
Glucose, UA: NEGATIVE mg/dL
Hgb urine dipstick: NEGATIVE
Ketones, ur: NEGATIVE mg/dL
Leukocytes, UA: NEGATIVE
NITRITE: NEGATIVE
Protein, ur: NEGATIVE mg/dL
SPECIFIC GRAVITY, URINE: 1.015 (ref 1.005–1.030)
UROBILINOGEN UA: 0.2 mg/dL (ref 0.0–1.0)
pH: 7.5 (ref 5.0–8.0)

## 2014-04-09 LAB — CBC
HCT: 40.3 % (ref 36.0–46.0)
HEMOGLOBIN: 13.9 g/dL (ref 12.0–15.0)
MCH: 33.4 pg (ref 26.0–34.0)
MCHC: 34.5 g/dL (ref 30.0–36.0)
MCV: 96.9 fL (ref 78.0–100.0)
Platelets: 259 10*3/uL (ref 150–400)
RBC: 4.16 MIL/uL (ref 3.87–5.11)
RDW: 13.3 % (ref 11.5–15.5)
WBC: 11.7 10*3/uL — ABNORMAL HIGH (ref 4.0–10.5)

## 2014-04-09 LAB — HCG, QUANTITATIVE, PREGNANCY: HCG, BETA CHAIN, QUANT, S: 23183 m[IU]/mL — AB (ref <5)

## 2014-04-09 LAB — POCT PREGNANCY, URINE: PREG TEST UR: POSITIVE — AB

## 2014-04-09 LAB — ABO/RH: ABO/RH(D): B POS

## 2014-04-09 IMAGING — US US OB COMP LESS 14 WK
1 series · 14 of 28 positions shown · non-contrast
Comparison: None.

CLINICAL DATA: Abdominal pain. Cramping. Nausea and vomiting.
Positive pregnancy test.

EXAM:
OBSTETRIC <14 WK US AND TRANSVAGINAL OB US
TECHNIQUE: Both transabdominal and transvaginal ultrasound examinations were
performed for complete evaluation of the gestation as well as the
maternal uterus, adnexal regions, and pelvic cul-de-sac.
Transvaginal technique was performed to assess early pregnancy.

[Series 1: us ob comp less 14 wks · 14 of 60 slices shown]
[im 3/60]
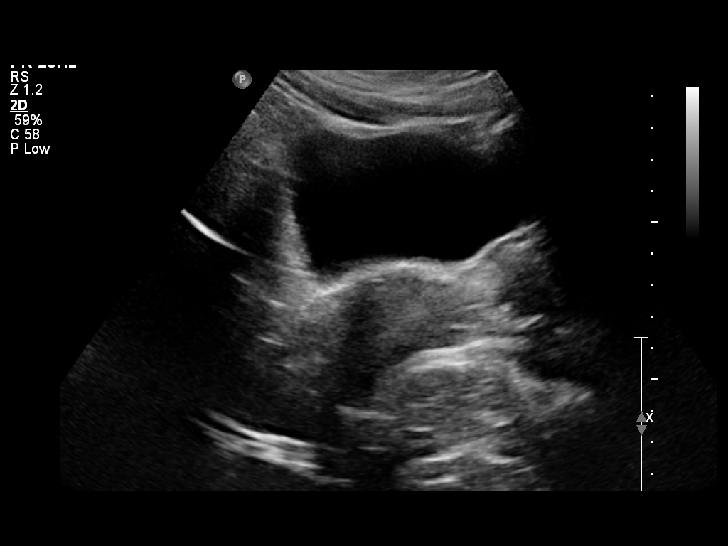
[im 7/60]
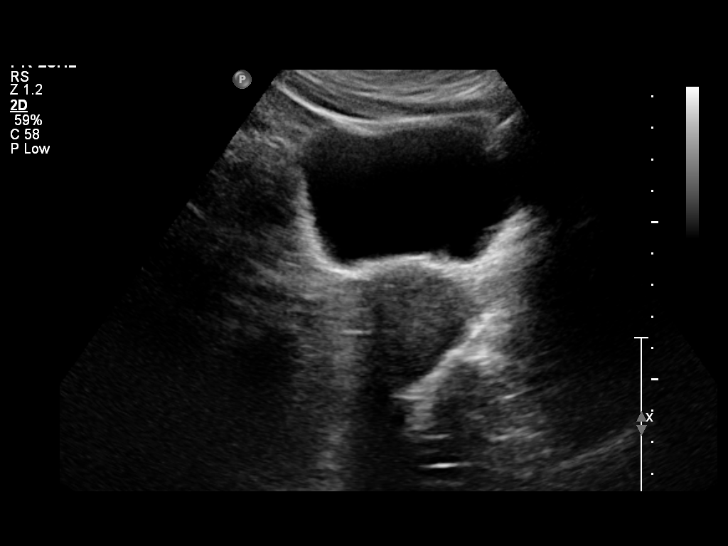
[im 11/60]
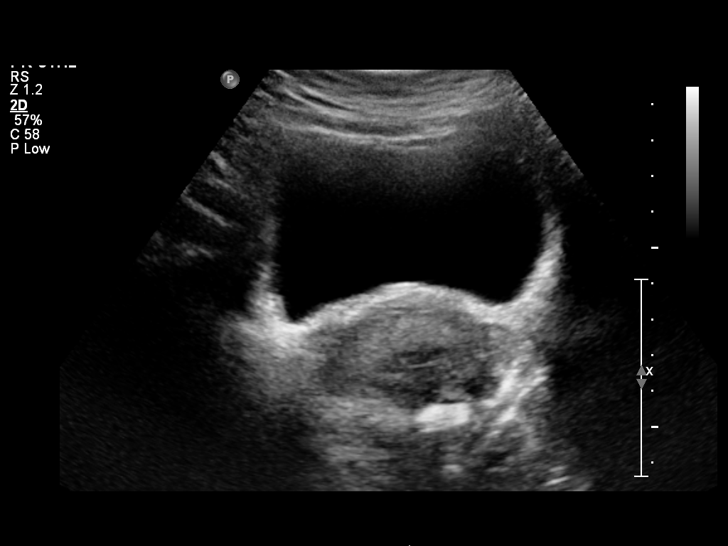
[im 16/60]
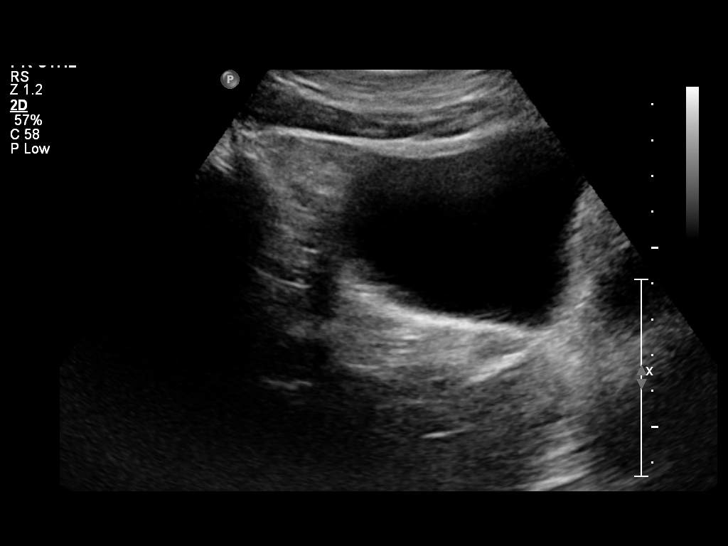
[im 20/60]
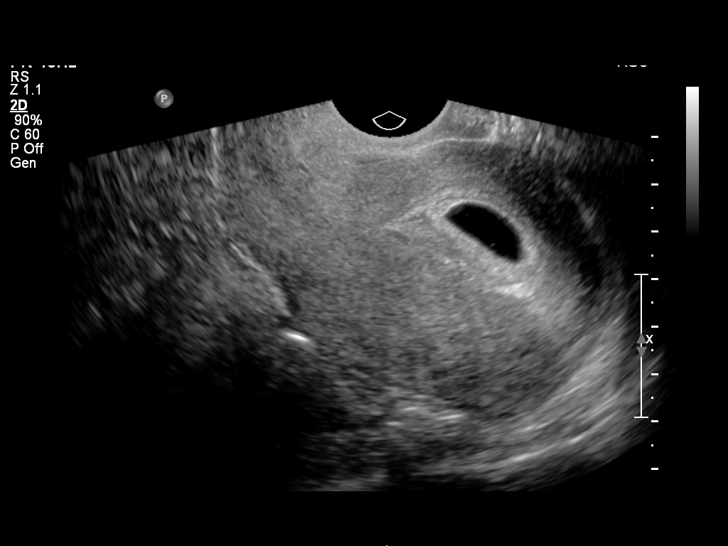
[im 25/60]
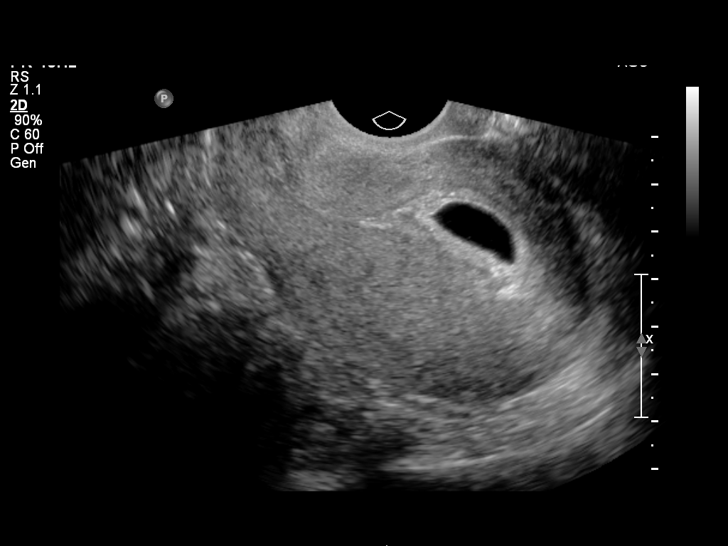
[im 29/60]
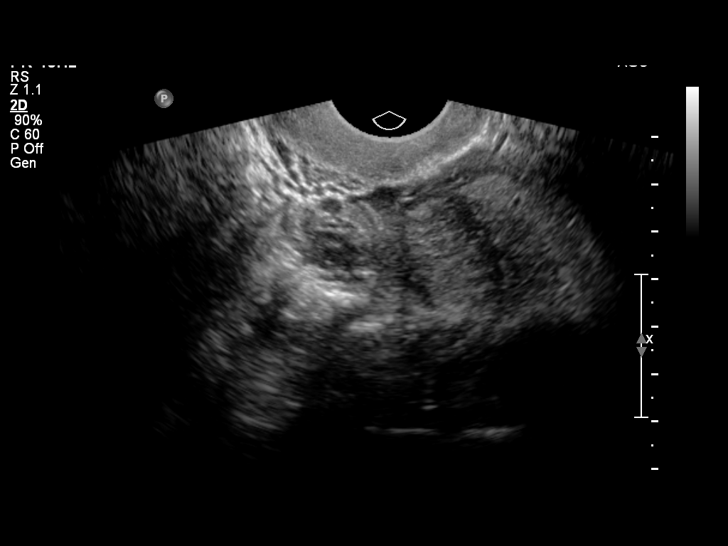
[im 33/60]
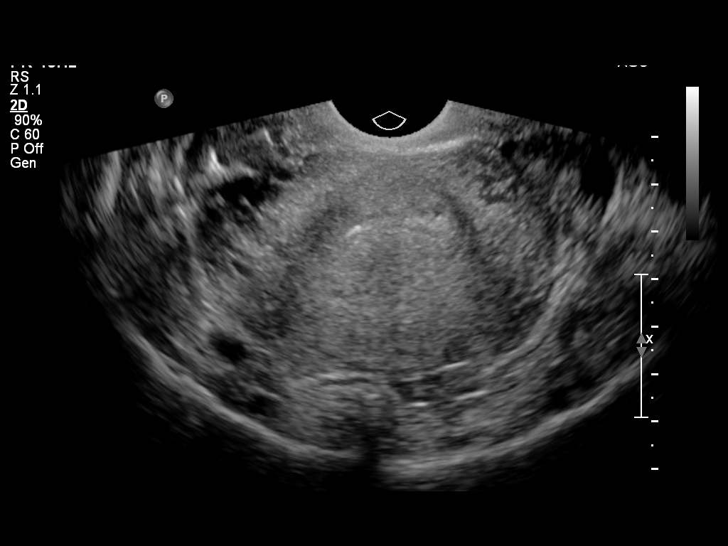
[im 38/60]
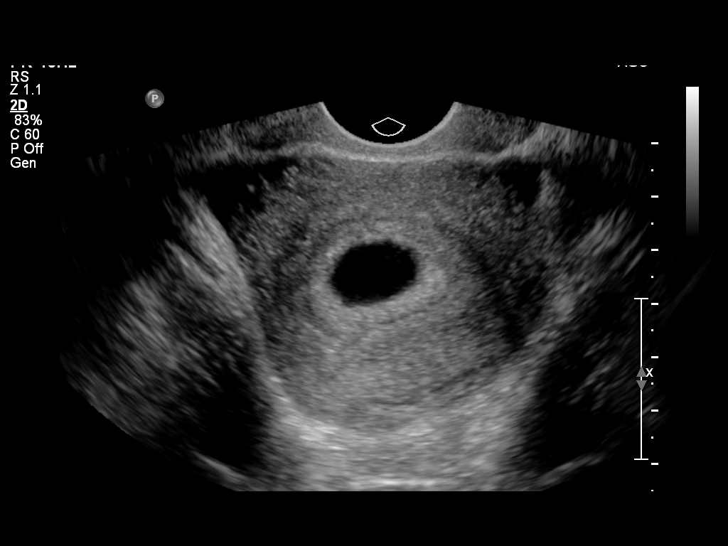
[im 42/60]
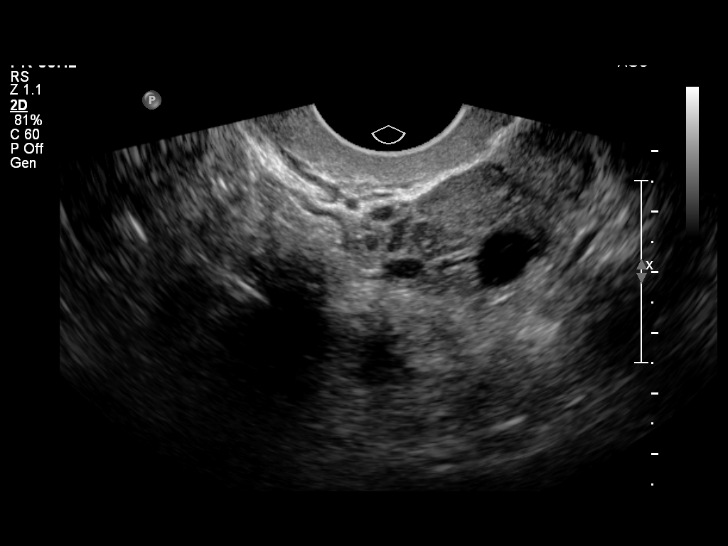
[im 46/60]
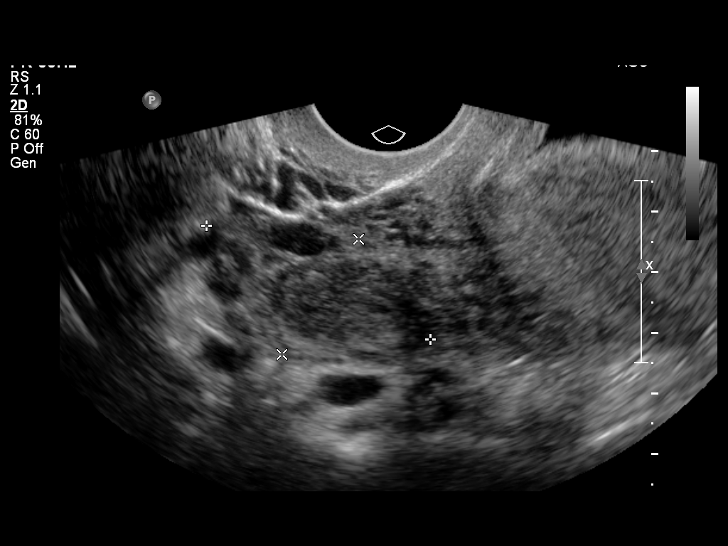
[im 51/60]
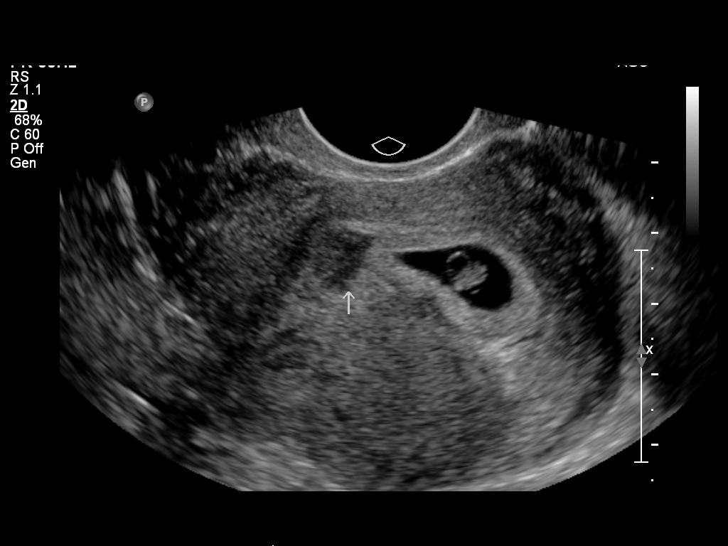
[im 55/60]
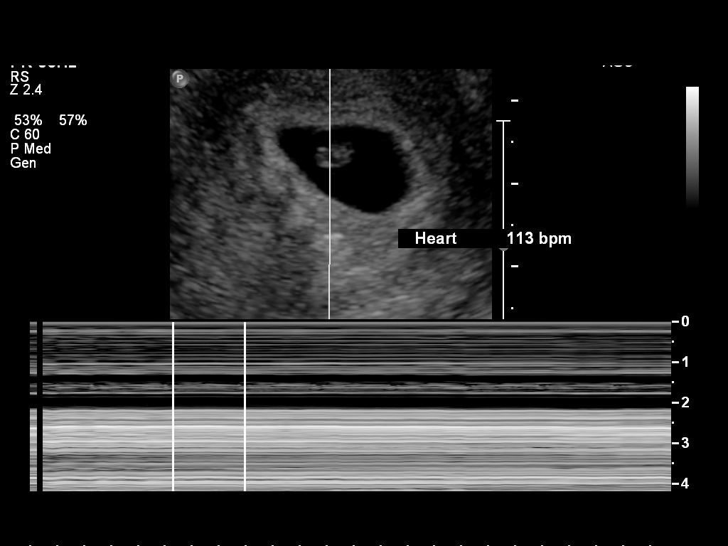
[im 60/60]
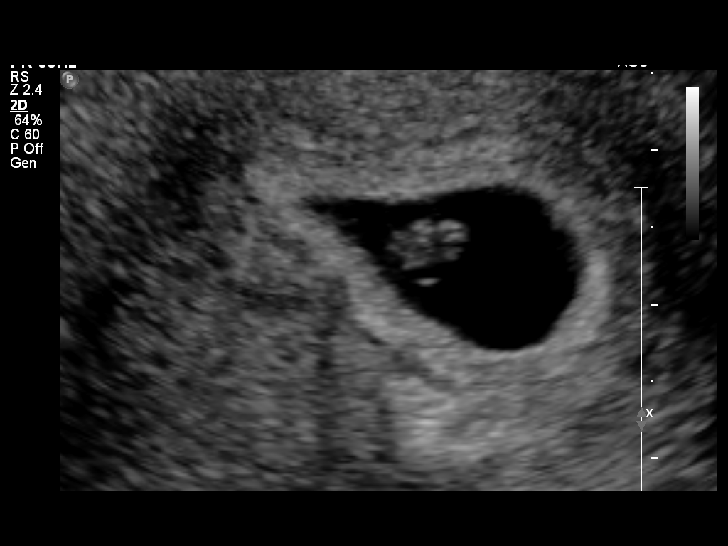

[14 of 28 positions shown; findings below may reference images not displayed]

FINDINGS: Intrauterine gestational sac: Visualized/normal in shape.

Yolk sac:  Visualized

Embryo:  Visualized

Cardiac Activity: Visualized

Heart Rate:  113 bpm

CRL:   5  mm   6 w 3 d                  US EDC: [DATE]

Maternal uterus/adnexae: Retroverted uterus noted. Small right
ovarian corpus luteum cyst noted. No adnexal mass or free fluid
identified.
IMPRESSION: Single living IUP measuring 6 weeks 3 days with US EDC of
[DATE].

Retroverted uterus noted. No significant maternal uterine or adnexal
abnormality identified.

## 2014-04-09 MED ORDER — METOCLOPRAMIDE HCL 10 MG PO TABS
10.0000 mg | ORAL_TABLET | Freq: Once | ORAL | Status: AC
  Administered 2014-04-09: 10 mg via ORAL
  Filled 2014-04-09: qty 1

## 2014-04-09 MED ORDER — METOCLOPRAMIDE HCL 10 MG PO TABS: 10.0000 mg | ORAL_TABLET | Freq: Four times a day (QID) | ORAL | Status: DC | PRN

## 2014-04-09 NOTE — Discharge Instructions (Signed)
First Trimester of Pregnancy The first trimester of pregnancy is from week 1 until the end of week 12 (months 1 through 3). During this time, your baby will begin to develop inside you. At 6-8 weeks, the eyes and face are formed, and the heartbeat can be seen on ultrasound. At the end of 12 weeks, all the baby's organs are formed. Prenatal care is all the medical care you receive before the birth of your baby. Make sure you get good prenatal care and follow all of your doctor's instructions. HOME CARE  Medicines  Take medicine only as told by your doctor. Some medicines are safe and some are not during pregnancy.  Take your prenatal vitamins as told by your doctor.  Take medicine that helps you poop (stool softener) as needed if your doctor says it is okay. Diet  Eat regular, healthy meals.  Your doctor will tell you the amount of weight gain that is right for you.  Avoid raw meat and uncooked cheese.  If you feel sick to your stomach (nauseous) or throw up (vomit):  Eat 4 or 5 small meals a day instead of 3 large meals.  Try eating a few soda crackers.  Drink liquids between meals instead of during meals.  If you have a hard time pooping (constipation):  Eat high-fiber foods like fresh vegetables, fruit, and whole grains.  Drink enough fluids to keep your pee (urine) clear or pale yellow. Activity and Exercise  Exercise only as told by your doctor. Stop exercising if you have cramps or pain in your lower belly (abdomen) or low back.  Try to avoid standing for long periods of time. Move your legs often if you must stand in one place for a long time.  Avoid heavy lifting.  Wear low-heeled shoes. Sit and stand up straight.  You can have sex unless your doctor tells you not to. Relief of Pain or Discomfort  Wear a good support bra if your breasts are sore.  Take warm water baths (sitz baths) to soothe pain or discomfort caused by hemorrhoids. Use hemorrhoid cream if your  doctor says it is okay.  Rest with your legs raised if you have leg cramps or low back pain.  Wear support hose if you have puffy, bulging veins (varicose veins) in your legs. Raise (elevate) your feet for 15 minutes, 3-4 times a day. Limit salt in your diet. Prenatal Care  Schedule your prenatal visits by the twelfth week of pregnancy.  Write down your questions. Take them to your prenatal visits.  Keep all your prenatal visits as told by your doctor. Safety  Wear your seat belt at all times when driving.  Make a list of emergency phone numbers. The list should include numbers for family, friends, the hospital, and police and fire departments. General Tips  Ask your doctor for a referral to a local prenatal class. Begin classes no later than at the start of month 6 of your pregnancy.  Ask for help if you need counseling or help with nutrition. Your doctor can give you advice or tell you where to go for help.  Do not use hot tubs, steam rooms, or saunas.  Do not douche or use tampons or scented sanitary pads.  Do not cross your legs for long periods of time.  Avoid litter boxes and soil used by cats.  Avoid all smoking, herbs, and alcohol. Avoid drugs not approved by your doctor.  Visit your dentist. At home, brush your teeth   with a soft toothbrush. Be gentle when you floss. GET HELP IF:  You are dizzy.  You have mild cramps or pressure in your lower belly.  You have a nagging pain in your belly area.  You continue to feel sick to your stomach, throw up, or have watery poop (diarrhea).  You have a bad smelling fluid coming from your vagina.  You have pain with peeing (urination).  You have increased puffiness (swelling) in your face, hands, legs, or ankles. GET HELP RIGHT AWAY IF:   You have a fever.  You are leaking fluid from your vagina.  You have spotting or bleeding from your vagina.  You have very bad belly cramping or pain.  You gain or lose weight  rapidly.  You throw up blood. It may look like coffee grounds.  You are around people who have German measles, fifth disease, or chickenpox.  You have a very bad headache.  You have shortness of breath.  You have any kind of trauma, such as from a fall or a car accident. Document Released: 12/07/2007 Document Revised: 11/04/2013 Document Reviewed: 04/30/2013 [employer] Patient Information 2015 [employer]. This information is not intended to replace advice given to you by your health care provider. Make sure you discuss any questions you have with your health care provider.  

## 2014-04-09 NOTE — [facility] Provider Note (Signed)
Attestation of Attending Supervision of Advanced Practitioner (CNM/NP): Evaluation and management procedures were performed by the Advanced Practitioner under my supervision and collaboration.  I have reviewed the Advanced Practitioner's note and chart, and I agree with the management and plan.  HARRAWAY-SMITH, Naryah Clenney 3:57 PM     

## 2014-04-09 NOTE — MAU Provider Note (Signed)
History     CSN: [id number]  Arrival date and time: 04/09/14 1007   First Provider Initiated Contact with Patient 04/09/14 1145      Chief Complaint  Patient presents with  . Possible Pregnancy  . Vomiting  . Abdominal Cramping   Possible Pregnancy  Abdominal Cramping    SAMUELLA Parks is a 33 y.o. G3P1011 at [redacted]w[redacted]d by LMP=02/26/2014 who presents with 3-4 days of nausea, vomiting, and dizziness while standing. Patient also endorses very mild lower abdominal pain that feels like cramping. Patient states that she had a positive pregnancy test 2 weeks ago and went to the health department last week and had another positive pregnancy test. She has not established pre-natal care anywhere as of yet. She endorses decreased PO intake overall but she has been able to keep more food down over the past day. Emesis is nonbloody and nonbilious. Reports an increase in clear vaginal discharge, but denies any vaginal bleeding. Also reports having diarrhea 2 weeks ago that resolved when she switched prenatal vitamins. Last had a BM yesterday.  No fevers or chills. No recent sick contacts.   OB History   Grav Para Term Preterm Abortions TAB SAB Ect Mult Living   3 1 1  1 1    1       Past Medical History  Diagnosis Date  . Medical history non-contributory     Past Surgical History  Procedure Laterality Date  . Breast biopsy      Fibroid tissue 2003  . Induced abortion    . Wisdom tooth extraction      Family History  Problem Relation Age of Onset  . Hypertension Brother   . Dementia Maternal Grandmother   . Cancer Maternal Grandfather     History  Substance Use Topics  . Smoking status: Former Smoker    Quit date: 11/22/2007  . Smokeless tobacco: Never Used  . Alcohol Use: No    Allergies: No Known Allergies  Prescriptions prior to admission  Medication Sig Dispense Refill  . Prenatal Vit-Fe Fumarate-FA (PRENATAL MULTIVITAMIN) TABS tablet Take 1 tablet by mouth daily at  12 noon.        ROS Physical Exam   Blood pressure 111/69, pulse 93, temperature 99.2 F (37.3 C), temperature source Oral, resp. rate 16, height 5\' 8"  (1.727 m), weight 76.749 kg (169 lb 3.2 oz), last menstrual period 02/26/2014, SpO2 99.00%.  Physical Exam  Nursing note and vitals reviewed. Constitutional: She is oriented to person, place, and time. She appears well-developed and well-nourished.  HENT:  Head: Normocephalic and atraumatic.  Eyes: Pupils are equal, round, and reactive to light.  Neck: Normal range of motion.  Cardiovascular: Normal rate, regular rhythm and normal heart sounds.   No murmur heard. Respiratory: Effort normal and breath sounds normal. She has no wheezes.  GI: Soft. Bowel sounds are normal. She exhibits no distension. There is no tenderness. There is no rebound and no guarding.  Musculoskeletal: Normal range of motion.  Neurological: She is alert and oriented to person, place, and time.  Skin: Skin is warm and dry.     Results for orders placed during the hospital encounter of 04/09/14 (from the past 24 hour(s))  URINALYSIS, ROUTINE W REFLEX MICROSCOPIC     Status: None   Collection Time    04/09/14 10:25 AM      Result Value Ref Range   Color, Urine YELLOW  YELLOW   APPearance CLEAR  CLEAR   Specific Gravity,  Urine 1.015  1.005 - 1.030   pH 7.5  5.0 - 8.0   Glucose, UA NEGATIVE  NEGATIVE mg/dL   Hgb urine dipstick NEGATIVE  NEGATIVE   Bilirubin Urine NEGATIVE  NEGATIVE   Ketones, ur NEGATIVE  NEGATIVE mg/dL   Protein, ur NEGATIVE  NEGATIVE mg/dL   Urobilinogen, UA 0.2  0.0 - 1.0 mg/dL   Nitrite NEGATIVE  NEGATIVE   Leukocytes, UA NEGATIVE  NEGATIVE  POCT PREGNANCY, URINE     Status: Abnormal   Collection Time    04/09/14 10:38 AM      Result Value Ref Range   Preg Test, Ur POSITIVE (*) NEGATIVE  CBC     Status: Abnormal   Collection Time    04/09/14 12:04 PM      Result Value Ref Range   WBC 11.7 (*) 4.0 - 10.5 K/uL   RBC 4.16  3.87 -  5.11 MIL/uL   Hemoglobin 13.9  12.0 - 15.0 g/dL   HCT 16.140.3  09.636.0 - 04.546.0 %   MCV 96.9  78.0 - 100.0 fL   MCH 33.4  26.0 - 34.0 pg   MCHC 34.5  30.0 - 36.0 g/dL   RDW 40.913.3  81.111.5 - 91.415.5 %   Platelets 259  150 - 400 K/uL  ABO/RH     Status: None   Collection Time    04/09/14 12:44 PM      Result Value Ref Range   ABO/RH(D) B POS    HCG, QUANTITATIVE, PREGNANCY     Status: Abnormal   Collection Time    04/09/14 12:44 PM      Result Value Ref Range   hCG, Beta Chain, Mahalia LongestQuant, S [id number] (*) <5 mIU/mL   [place] Ob Comp Less 14 Wks  04/09/2014   CLINICAL DATA:  Abdominal pain. Cramping. Nausea and vomiting. Positive pregnancy test.  EXAM: OBSTETRIC <14 WK US AND TRANSVAGINAL OB US  TECHNIQUE: Both transabdominal and transvaginal ultrasound examinations were performed for complete evaluation of the gestation as well as the maternal uterus, adnexal regions, and pelvic cul-de-sac. Transvaginal technique was performed to assess early pregnancy.  COMPARISON:  None.  FINDINGS: Intrauterine gestational sac: Visualized/normal in shape.  Yolk sac:  Visualized  Embryo:  Visualized  Cardiac Activity: Visualized  Heart Rate:  113 bpm  CRL:   5  mm   6 w 3 d                  US EDC: 11/30/2014  Maternal uterus/adnexae: Retroverted uterus noted. Small right ovarian corpus luteum cyst noted. No adnexal mass or free fluid identified.  IMPRESSION: Single living IUP measuring 6 weeks 3 days with US EDC of 11/30/2014.  Retroverted uterus noted. No significant maternal uterine or adnexal abnormality identified.   Electronically Signed   By: Myles RosenthalJohn  Stahl M.D.   On: 04/09/2014 14:09   [place] Ob Transvaginal  04/09/2014   CLINICAL DATA:  Abdominal pain. Cramping. Nausea and vomiting. Positive pregnancy test.  EXAM: OBSTETRIC <14 WK US AND TRANSVAGINAL OB US  TECHNIQUE: Both transabdominal and transvaginal ultrasound examinations were performed for complete evaluation of the gestation as well as the maternal uterus, adnexal regions, and  pelvic cul-de-sac. Transvaginal technique was performed to assess early pregnancy.  COMPARISON:  None.  FINDINGS: Intrauterine gestational sac: Visualized/normal in shape.  Yolk sac:  Visualized  Embryo:  Visualized  Cardiac Activity: Visualized  Heart Rate:  113 bpm  CRL:   5  mm   6  w 3 d                  [place] EDC: 11/30/2014  Maternal uterus/adnexae: Retroverted uterus noted. Small right ovarian corpus luteum cyst noted. No adnexal mass or free fluid identified.  IMPRESSION: Single living IUP measuring 6 weeks 3 days with [place] EDC of 11/30/2014.  Retroverted uterus noted. No significant maternal uterine or adnexal abnormality identified.   Electronically Signed   By: Myles Rosenthal M.D.   On: 04/09/2014 14:09     MAU Course  Procedures None  MDM 12:00PM Presentation most likely nausea/vomiting of pregnancy. Will give reglan and PO challenge. Will also rule out ectopic given mild abdominal cramping. Orthostatic vitals within normal limits.  Assessment and Plan  A: G3P1011 at [redacted]w[redacted]d by LMP=02/26/2014 with nausea/vomiting of pregnancy. TVUS showed single living IUP @[redacted]w[redacted]d  with yolk sac and cardiac activity visualized. Nausea improved with reglan and patient was able to tolerate PO intake.   P: Discharge home in stable condition. Will give prescription for reglan and encourage PO intake. Additionally gave list of providers to establish prenatal care. Instructed to follow up in MAU if symptoms worsen or are not improving.   Jacquiline Doe 04/09/2014, 2:27 PM   Evaluation and management procedures were performed by the PA student under my supervision and collaboration. I have reviewed the note and chart, and I agree with the management and plan.  Iona Hansen Tennis Mckinnon, NP 04/09/2014 3:03 PM

## 2014-04-09 NOTE — MAU Note (Signed)
Patient states she has had a positive home pregnancy test and one at the health department States she has had nausea and vomiting since 10-3 almost everything. Has some slight cramping off and on. Denies bleeding but has a heavier than usual discharge.

## 2014-05-05 ENCOUNTER — Encounter (HOSPITAL_COMMUNITY): Payer: Self-pay

## 2014-05-14 ENCOUNTER — Encounter (HOSPITAL_COMMUNITY): Payer: Self-pay | Admitting: General Practice

## 2014-05-14 ENCOUNTER — Ambulatory Visit (HOSPITAL_COMMUNITY)
Admission: AD | Admit: 2014-05-14 | Discharge: 2014-05-14 | Disposition: A | Discharge status: Home / Self Care | Payer: Medicaid Other | Source: Ambulatory Visit | Attending: Obstetrics and Gynecology | Admitting: Obstetrics and Gynecology

## 2014-05-14 ENCOUNTER — Inpatient Hospital Stay (HOSPITAL_COMMUNITY): Payer: Medicaid Other | Admitting: Anesthesiology

## 2014-05-14 ENCOUNTER — Ambulatory Visit (HOSPITAL_COMMUNITY)
Admission: RE | Admit: 2014-05-14 | Payer: Medicaid Other | Source: Ambulatory Visit | Admitting: Obstetrics and Gynecology

## 2014-05-14 ENCOUNTER — Encounter (HOSPITAL_COMMUNITY)
Admission: AD | Disposition: A | Discharge status: Home / Self Care | Payer: Self-pay | Source: Ambulatory Visit | Attending: Obstetrics and Gynecology

## 2014-05-14 DIAGNOSIS — Z8249 Family history of ischemic heart disease and other diseases of the circulatory system: Secondary | ICD-10-CM | POA: Diagnosis not present

## 2014-05-14 DIAGNOSIS — Z87891 Personal history of nicotine dependence: Secondary | ICD-10-CM | POA: Diagnosis not present

## 2014-05-14 DIAGNOSIS — O021 Missed abortion: Secondary | ICD-10-CM | POA: Diagnosis not present

## 2014-05-14 DIAGNOSIS — R109 Unspecified abdominal pain: Secondary | ICD-10-CM | POA: Diagnosis not present

## 2014-05-14 HISTORY — PX: DILATION AND EVACUATION: SHX1459

## 2014-05-14 LAB — CBC WITH DIFFERENTIAL/PLATELET
Basophils Absolute: 0 10*3/uL (ref 0.0–0.1)
Basophils Relative: 0 % (ref 0–1)
Eosinophils Absolute: 0.2 10*3/uL (ref 0.0–0.7)
Eosinophils Relative: 2 % (ref 0–5)
HEMATOCRIT: 40.6 % (ref 36.0–46.0)
HEMOGLOBIN: 14.1 g/dL (ref 12.0–15.0)
LYMPHS ABS: 2.8 10*3/uL (ref 0.7–4.0)
Lymphocytes Relative: 27 % (ref 12–46)
MCH: 33.9 pg (ref 26.0–34.0)
MCHC: 34.7 g/dL (ref 30.0–36.0)
MCV: 97.6 fL (ref 78.0–100.0)
MONOS PCT: 13 % — AB (ref 3–12)
Monocytes Absolute: 1.3 10*3/uL — ABNORMAL HIGH (ref 0.1–1.0)
NEUTROS ABS: 5.8 10*3/uL (ref 1.7–7.7)
NEUTROS PCT: 58 % (ref 43–77)
Platelets: 274 10*3/uL (ref 150–400)
RBC: 4.16 MIL/uL (ref 3.87–5.11)
RDW: 13 % (ref 11.5–15.5)
WBC: 10.1 10*3/uL (ref 4.0–10.5)

## 2014-05-14 SURGERY — DILATION AND EVACUATION, UTERUS
Anesthesia: Spinal | Site: Vagina

## 2014-05-14 MED ORDER — FENTANYL CITRATE 0.05 MG/ML IJ SOLN
25.0000 ug | INTRAMUSCULAR | Status: DC | PRN
Start: 2014-05-14 — End: 2014-05-15

## 2014-05-14 MED ORDER — LIDOCAINE HCL 2 % IJ SOLN
INTRAMUSCULAR | Status: AC
  Filled 2014-05-14: qty 20

## 2014-05-14 MED ORDER — CITRIC ACID-SODIUM CITRATE 334-500 MG/5ML PO SOLN
30.0000 mL | Freq: Once | ORAL | Status: AC
  Administered 2014-05-14: 30 mL via ORAL
  Filled 2014-05-14: qty 15

## 2014-05-14 MED ORDER — LACTATED RINGERS IV SOLN
INTRAVENOUS | Status: DC | PRN
  Administered 2014-05-14 (×2): via INTRAVENOUS

## 2014-05-14 MED ORDER — KETOROLAC TROMETHAMINE 30 MG/ML IJ SOLN
30.0000 mg | Freq: Once | INTRAMUSCULAR | Status: AC
  Administered 2014-05-14: 30 mg via INTRAVENOUS
  Filled 2014-05-14: qty 1

## 2014-05-14 MED ORDER — METOCLOPRAMIDE HCL 5 MG/ML IJ SOLN
10.0000 mg | Freq: Once | INTRAMUSCULAR | Status: DC | PRN
Start: 2014-05-14 — End: 2014-05-15

## 2014-05-14 MED ORDER — MIDAZOLAM HCL 5 MG/5ML IJ SOLN
INTRAMUSCULAR | Status: DC | PRN
  Administered 2014-05-14 (×2): 2 mg via INTRAVENOUS

## 2014-05-14 MED ORDER — LACTATED RINGERS IV SOLN
INTRAVENOUS | Status: DC
  Administered 2014-05-14: 17:00:00 via INTRAVENOUS

## 2014-05-14 MED ORDER — ONDANSETRON HCL 4 MG/2ML IJ SOLN
INTRAMUSCULAR | Status: DC | PRN
  Administered 2014-05-14: 4 mg via INTRAVENOUS

## 2014-05-14 MED ORDER — FENTANYL CITRATE 0.05 MG/ML IJ SOLN
INTRAMUSCULAR | Status: DC | PRN
  Administered 2014-05-14: 100 ug via INTRAVENOUS

## 2014-05-14 MED ORDER — MEPERIDINE HCL 25 MG/ML IJ SOLN
INTRAMUSCULAR | Status: AC
  Filled 2014-05-14: qty 1

## 2014-05-14 MED ORDER — BUPIVACAINE IN DEXTROSE 0.75-8.25 % IT SOLN
INTRATHECAL | Status: DC | PRN
  Administered 2014-05-14: 9 mg via INTRATHECAL

## 2014-05-14 MED ORDER — FAMOTIDINE IN NACL 20-0.9 MG/50ML-% IV SOLN
20.0000 mg | Freq: Once | INTRAVENOUS | Status: AC
  Administered 2014-05-14: 20 mg via INTRAVENOUS
  Filled 2014-05-14: qty 50

## 2014-05-14 MED ORDER — KETOROLAC TROMETHAMINE 60 MG/2ML IM SOLN: 60.0000 mg | Freq: Once | INTRAMUSCULAR | Status: DC

## 2014-05-14 MED ORDER — MEPERIDINE HCL 25 MG/ML IJ SOLN
6.2500 mg | INTRAMUSCULAR | Status: DC | PRN
  Administered 2014-05-14: 6.25 mg via INTRAVENOUS

## 2014-05-14 MED ORDER — METOCLOPRAMIDE HCL 5 MG/ML IJ SOLN
10.0000 mg | Freq: Once | INTRAMUSCULAR | Status: AC
  Administered 2014-05-14: 10 mg via INTRAVENOUS
  Filled 2014-05-14: qty 2

## 2014-05-14 SURGICAL SUPPLY — 18 items
CATH ROBINSON RED A/P 16FR (CATHETERS) ×3 IMPLANT
CLOTH BEACON ORANGE TIMEOUT ST (SAFETY) ×3 IMPLANT
DECANTER SPIKE VIAL GLASS SM (MISCELLANEOUS) ×3 IMPLANT
GLOVE BIO SURGEON STRL SZ8 (GLOVE) ×3 IMPLANT
GLOVE ORTHO TXT STRL SZ7.5 (GLOVE) ×3 IMPLANT
GOWN STRL REUS W/TWL LRG LVL3 (GOWN DISPOSABLE) ×6 IMPLANT
KIT BERKELEY 1ST TRIMESTER 3/8 (MISCELLANEOUS) ×3 IMPLANT
NS IRRIG 1000ML POUR BTL (IV SOLUTION) ×3 IMPLANT
PACK VAGINAL MINOR WOMEN LF (CUSTOM PROCEDURE TRAY) ×3 IMPLANT
PAD OB MATERNITY 4.3X12.25 (PERSONAL CARE ITEMS) ×3 IMPLANT
PAD PREP 24X48 CUFFED NSTRL (MISCELLANEOUS) ×3 IMPLANT
SET BERKELEY SUCTION TUBING (SUCTIONS) ×3 IMPLANT
SLEEVE SCD COMPRESS KNEE MED (MISCELLANEOUS) ×3 IMPLANT
TOWEL OR 17X24 6PK STRL BLUE (TOWEL DISPOSABLE) ×6 IMPLANT
VACURETTE 10 RIGID CVD (CANNULA) IMPLANT
VACURETTE 7MM CVD STRL WRAP (CANNULA) IMPLANT
VACURETTE 8 RIGID CVD (CANNULA) IMPLANT
VACURETTE 9 RIGID CVD (CANNULA) ×3 IMPLANT

## 2014-05-14 NOTE — Anesthesia Preprocedure Evaluation (Addendum)
Anesthesia Evaluation  Patient identified by MRN, date of birth, ID band Patient awake    Reviewed: Allergy & Precautions, H&P , NPO status , Patient's Chart, lab work & pertinent test results, reviewed documented beta blocker date and time   History of Anesthesia Complications Negative for: history of anesthetic complications  Airway Mallampati: III  TM Distance: >3 FB Neck ROM: Full    Dental no notable dental hx. (+) Teeth Intact   Pulmonary neg pulmonary ROS, former smoker,  breath sounds clear to auscultation  Pulmonary exam normal       Cardiovascular Rhythm:regular Rate:Normal     Neuro/Psych negative neurological ROS  negative psych ROS   GI/Hepatic negative GI ROS, Neg liver ROS,   Endo/Other  negative endocrine ROS  Renal/GU negative Renal ROS  negative genitourinary   Musculoskeletal   Abdominal   Peds  Hematology negative hematology ROS (+)   Anesthesia Other Findings Ate at 1:30 pm today Have ordered pepcid, reglan and bicitra to be given before patient comes from MAU to OR Per Dr Jackelyn KnifeMeisinger case is EMERGENT and cannot wait for patient to be NPO  Reproductive/Obstetrics (+) Pregnancy (missed ab, 11 weeks)                           Anesthesia Physical Anesthesia Plan  ASA: II and emergent  Anesthesia Plan: Spinal   Post-op Pain Management:    Induction:   Airway Management Planned: Natural Airway  Additional Equipment:   Intra-op Plan:   Post-operative Plan:   Informed Consent: I have reviewed the patients History and Physical, chart, labs and discussed the procedure including the risks, benefits and alternatives for the proposed anesthesia with the patient or authorized representative who has indicated his/her understanding and acceptance.   Dental Advisory Given  Plan Discussed with: CRNA, Surgeon and Anesthesiologist  Anesthesia Plan Comments:        Anesthesia Quick Evaluation

## 2014-05-14 NOTE — Discharge Instructions (Signed)

## 2014-05-14 NOTE — H&P (Addendum)
History     CSN: [id number]  Arrival date and time: 05/14/14 1604  First Provider Initiated Contact with Patient 05/14/14 1646    Chief Complaint  Patient presents with  . Vaginal Bleeding  . Abdominal Pain   HPI  Sarah Parks is a 33 y.o. G3P1011 at 7256w0d who presents to MAU today with a complaint of vaginal bleeding and lower abdominal pain. The patient was diagnosed with a failed pregnancy in the office on 05/09/14. She is scheduled for D&C on 05/16/14. She states that she has had spotting, but the bleeding became heavier last night. She has used 3 pads today. She notes some small clots. She also complains of severe lower abdominal pain that comes and goes every few minutes. She rates the pain 10/10 when it occurs. She took Tylenol and Ibuprofen without relief. She denies fever or N/V.   OB History    Gravida Para Term Preterm AB TAB SAB Ectopic Multiple Living   3 1 1  1 1    1       Past Medical History  Diagnosis Date  . Medical history non-contributory     Past Surgical History  Procedure Laterality Date  . Breast biopsy      Fibroid tissue 2003  . Induced abortion    . Wisdom tooth extraction      Family History  Problem Relation Age of Onset  . Hypertension Brother   . Dementia Maternal Grandmother   . Cancer Maternal Grandfather     History  Substance Use Topics  . Smoking status: Former Smoker    Quit date: 11/22/2007  . Smokeless tobacco: Never Used  . Alcohol Use: No    Allergies: No Known Allergies  Prescriptions prior to admission  Medication Sig Dispense Refill Last Dose  . ibuprofen (ADVIL,MOTRIN) 200 MG tablet Take 600-800 mg by mouth every 6 (six) hours as needed for mild pain.     Marland Kitchen. metoCLOPramide (REGLAN) 10 MG tablet Take 1 tablet (10 mg total) by mouth every 6 (six) hours as needed for nausea or vomiting.  (Patient not taking: Reported on 05/12/2014) 30 tablet 2   . Prenatal Vit-Fe Fumarate-FA (PRENATAL MULTIVITAMIN) TABS tablet Take 1 tablet by mouth daily at 12 noon.   04/08/2014 at Unknown time    Review of Systems  Constitutional: Negative for fever and malaise/fatigue.  Gastrointestinal: Positive for abdominal pain. Negative for nausea and vomiting.  Genitourinary:   + vaginal bleeding   Physical Exam   Blood pressure 119/75, pulse 88, temperature 98.4 F (36.9 C), temperature source Oral, resp. rate 20, height 5\' 8"  (1.727 m), last menstrual period 02/26/2014, SpO2 99 %.  Physical Exam  Constitutional: She is oriented to person, place, and time. She appears well-developed and well-nourished. No distress.  HENT:  Head: Normocephalic.  Cardiovascular: Normal rate.  Respiratory: Effort normal.  GI: Soft. She exhibits no distension and no mass. There is tenderness (moderate tenderness to palpation of the suprapubic region). There is no rebound and no guarding.  Genitourinary: Uterus is tender. Cervix exhibits no motion tenderness, no discharge and no friability. There is bleeding (small amount of dark brown blood in the vagina) in the vagina. No vaginal discharge found.  Cervix: closed, thick  Neurological: She is alert and oriented to person, place, and time.  Skin: Skin is warm and dry. No erythema.  Psychiatric: She has a normal mood and affect.    Lab Results Last 24 Hours    Results for  orders placed or performed during the hospital encounter of 05/14/14 (from the past 24 hour(s))  CBC with Differential Status: Abnormal   Collection Time: 05/14/14 5:20 PM  Result Value Ref Range   WBC 10.1 4.0 - 10.5 K/uL   RBC 4.16 3.87 - 5.11 MIL/uL   Hemoglobin 14.1 12.0 - 15.0 g/dL   HCT 74.240.6 59.536.0 - 63.846.0 %   MCV 97.6 78.0 - 100.0 fL   MCH 33.9 26.0 - 34.0 pg   MCHC 34.7 30.0 - 36.0 g/dL   RDW 75.613.0 43.311.5 - 29.515.5 %   Platelets  274 150 - 400 K/uL   Neutrophils Relative % 58 43 - 77 %   Neutro Abs 5.8 1.7 - 7.7 K/uL   Lymphocytes Relative 27 12 - 46 %   Lymphs Abs 2.8 0.7 - 4.0 K/uL   Monocytes Relative 13 (H) 3 - 12 %   Monocytes Absolute 1.3 (H) 0.1 - 1.0 K/uL   Eosinophils Relative 2 0 - 5 %   Eosinophils Absolute 0.2 0.0 - 0.7 K/uL   Basophils Relative 0 0 - 1 %   Basophils Absolute 0.0 0.0 - 0.1 K/uL      MAU Course  Procedures None  MDM Discussed patient with Dr. Jackelyn KnifeMeisinger. He will plan to do D&C tonight. Patient last ate at 1300.  IV started with LR and 30 mg IV Toradol given.   Assessment and Plan  A: Missed abortion  P: Patient to OR with Dr. Jackelyn KnifeMeisinger for D&C tonight  I have discussed the procedure and risks, will proceed with D&E that was scheduled for later this week.  Although she is not bleeding excessively, she is having significant pain, and I feel it is necessary to proceed immediately even though it has not been 8 hours since she last ate.

## 2014-05-14 NOTE — MAU Note (Addendum)
Patient was diagnosed with a failed pregnancy on 11-6. Has been spotting but today heavier with clots and abdominal pain. Patient is scheduled for a D & E on 11-13.

## 2014-05-14 NOTE — Anesthesia Procedure Notes (Signed)
Spinal Patient location during procedure: OR Start time: 05/14/2014 7:32 PM Staffing Anesthesiologist: Shakyla Nolley A. Performed by: anesthesiologist  Preanesthetic Checklist Completed: patient identified, site marked, surgical consent, pre-op evaluation, timeout performed, IV checked, risks and benefits discussed and monitors and equipment checked Spinal Block Patient position: sitting Prep: site prepped and draped and DuraPrep Patient monitoring: heart rate, cardiac monitor, continuous pulse ox and blood pressure Approach: midline Location: L3-4 Injection technique: single-shot Needle Needle type: Sprotte  Needle gauge: 24 G Needle length: 9 cm Needle insertion depth: 5 cm Assessment Sensory level: T10 Additional Notes Patient tolerated procedure well. Adequate sensory level.

## 2014-05-14 NOTE — Anesthesia Postprocedure Evaluation (Signed)
  Anesthesia Post-op Note  Patient: Sarah Parks  Procedure(s) Performed: Procedure(s): DILATATION AND EVACUATION (N/A)  Patient Location: PACU  Anesthesia Type:Spinal  Level of Consciousness: awake, alert  and oriented  Airway and Oxygen Therapy: Patient Spontanous Breathing  Post-op Pain: none  Post-op Assessment: Post-op Vital signs reviewed, Patient's Cardiovascular Status Stable, Respiratory Function Stable, Patent Airway, No signs of Nausea or vomiting, Pain level controlled, No headache, No backache, No residual numbness and No residual motor weakness  Post-op Vital Signs: Reviewed and stable  Last Vitals:  Filed Vitals:   05/14/14 2030  BP: 110/59  Pulse: 73  Temp:   Resp: 18    Complications: No apparent anesthesia complications

## 2014-05-14 NOTE — Op Note (Signed)
  Preoperative Diagnosis:  Missed abortion Postop Diagnosis:  Missed Abortion Procedure:  D&E Anesthesia:  Spinal Findings: Cervix was opening, uterus was slightly enlarged, abundant products of conception were obtained Specimens: Products of conception sent for routine pathology Estimated blood loss: Minimal Complications: None  Procedure in detail: The patient was taken to the operating room and placed in the sitting position. Dr. Malen GauzeFoster instilled spinal anesthesia and she was placed in the dorsosupine position, then in mobile stirrups. Perineum and vagina were prepped and draped in the usual sterile fashion, bladder drained with a red Robinson catheter. A Graves speculum was inserted in the vagina. The anterior lip of the cervix was grasped with a single-tooth tenaculum. Uterus then sounded to 9 cm. Cervix was gradually easily dilated to size 25 dilator. A size 9 curved suction curet was then inserted without difficulty. Suction curettage was return was performed with return of abundant products of conception. Sharp curettage was performed with which revealed good uterine cry in all quadrants and no significant tissue. Suction curettage was performed one more time which revealed minimal blood. The single-tooth tenaculum was removed from the cervix and bleeding was controlled with pressure. All instruments were removed from the vagina. The patient was taken to the recovery in stable condition after tolerating the procedure well. Counts were correct, and she had PAS hose on throughout the procedure.

## 2014-05-14 NOTE — MAU Provider Note (Signed)
History     CSN: [id number]  Arrival date and time: 05/14/14 1604   First Provider Initiated Contact with Patient 05/14/14 1646      Chief Complaint  Patient presents with  . Vaginal Bleeding  . Abdominal Pain   HPI  Sarah Parks is a 33 y.o. G3P1011 at 4441w0d who presents to MAU today with a complaint of vaginal bleeding and lower abdominal pain. The patient was diagnosed with a failed pregnancy in the office on 05/09/14. She is scheduled for D&C on 05/16/14. She states that she has had spotting, but the bleeding became heavier last night. She has used 3 pads today. She notes some small clots. She also complains of severe lower abdominal pain that comes and goes every few minutes. She rates the pain 10/10 when it occurs. She took Tylenol and Ibuprofen without relief. She denies fever or N/V.   OB History    Gravida Para Term Preterm AB TAB SAB Ectopic Multiple Living   3 1 1  1 1    1       Past Medical History  Diagnosis Date  . Medical history non-contributory     Past Surgical History  Procedure Laterality Date  . Breast biopsy      Fibroid tissue 2003  . Induced abortion    . Wisdom tooth extraction      Family History  Problem Relation Age of Onset  . Hypertension Brother   . Dementia Maternal Grandmother   . Cancer Maternal Grandfather     History  Substance Use Topics  . Smoking status: Former Smoker    Quit date: 11/22/2007  . Smokeless tobacco: Never Used  . Alcohol Use: No    Allergies: No Known Allergies  Prescriptions prior to admission  Medication Sig Dispense Refill Last Dose  . ibuprofen (ADVIL,MOTRIN) 200 MG tablet Take 600-800 mg by mouth every 6 (six) hours as needed for mild pain.     Marland Kitchen. metoCLOPramide (REGLAN) 10 MG tablet Take 1 tablet (10 mg total) by mouth every 6 (six) hours as needed for nausea or vomiting. (Patient not taking: Reported on 05/12/2014) 30 tablet 2   . Prenatal Vit-Fe Fumarate-FA (PRENATAL MULTIVITAMIN) TABS  tablet Take 1 tablet by mouth daily at 12 noon.   04/08/2014 at Unknown time    Review of Systems  Constitutional: Negative for fever and malaise/fatigue.  Gastrointestinal: Positive for abdominal pain. Negative for nausea and vomiting.  Genitourinary:       + vaginal bleeding   Physical Exam   Blood pressure 119/75, pulse 88, temperature 98.4 F (36.9 C), temperature source Oral, resp. rate 20, height 5\' 8"  (1.727 m), last menstrual period 02/26/2014, SpO2 99 %.  Physical Exam  Constitutional: She is oriented to person, place, and time. She appears well-developed and well-nourished. No distress.  HENT:  Head: Normocephalic.  Cardiovascular: Normal rate.   Respiratory: Effort normal.  GI: Soft. She exhibits no distension and no mass. There is tenderness (moderate tenderness to palpation of the suprapubic region). There is no rebound and no guarding.  Genitourinary: Uterus is tender. Cervix exhibits no motion tenderness, no discharge and no friability. There is bleeding (small amount of dark brown blood in the vagina) in the vagina. No vaginal discharge found.  Cervix: closed, thick  Neurological: She is alert and oriented to person, place, and time.  Skin: Skin is warm and dry. No erythema.  Psychiatric: She has a normal mood and affect.   Results for orders  placed or performed during the hospital encounter of 05/14/14 (from the past 24 hour(s))  CBC with Differential     Status: Abnormal   Collection Time: 05/14/14  5:20 PM  Result Value Ref Range   WBC 10.1 4.0 - 10.5 K/uL   RBC 4.16 3.87 - 5.11 MIL/uL   Hemoglobin 14.1 12.0 - 15.0 g/dL   HCT 95.640.6 21.336.0 - 08.646.0 %   MCV 97.6 78.0 - 100.0 fL   MCH 33.9 26.0 - 34.0 pg   MCHC 34.7 30.0 - 36.0 g/dL   RDW 57.813.0 46.911.5 - 62.915.5 %   Platelets 274 150 - 400 K/uL   Neutrophils Relative % 58 43 - 77 %   Neutro Abs 5.8 1.7 - 7.7 K/uL   Lymphocytes Relative 27 12 - 46 %   Lymphs Abs 2.8 0.7 - 4.0 K/uL   Monocytes Relative 13 (H) 3 - 12 %    Monocytes Absolute 1.3 (H) 0.1 - 1.0 K/uL   Eosinophils Relative 2 0 - 5 %   Eosinophils Absolute 0.2 0.0 - 0.7 K/uL   Basophils Relative 0 0 - 1 %   Basophils Absolute 0.0 0.0 - 0.1 K/uL    MAU Course  Procedures None  MDM Discussed patient with Dr. Jackelyn KnifeMeisinger. He will plan to do D&C tonight. Patient last ate at 1300.  IV started with LR and 30 mg IV Toradol given.   Assessment and Plan  A: Missed abortion  P: Patient to OR with Dr. Jackelyn KnifeMeisinger for D&C tonight  Sarah LowensteinJulie N Ethridge Sollenberger, PA-C  05/14/2014, 4:46 PM

## 2014-05-14 NOTE — Transfer of Care (Signed)
Immediate Anesthesia Transfer of Care Note  Patient: Sarah PeonDanielle G Lesage  Procedure(s) Performed: Procedure(s): DILATATION AND EVACUATION (N/A)  Patient Location: PACU  Anesthesia Type:Spinal  Level of Consciousness: awake, alert , oriented and patient cooperative  Airway & Oxygen Therapy: Patient Spontanous Breathing  Post-op Assessment: Post -op Vital signs reviewed and stable  Post vital signs: Reviewed and stable  Complications: No apparent anesthesia complications

## 2014-05-15 ENCOUNTER — Encounter (HOSPITAL_COMMUNITY): Payer: Self-pay | Admitting: Obstetrics and Gynecology

## 2014-07-04 NOTE — L&D Delivery Note (Signed)
Delivery Note At 5:22 PM a viable female was delivered via Vaginal, Spontaneous Delivery (Presentation: ; Occiput Anterior).  APGAR: 9, 9; weight pending.   Placenta status: Intact, Spontaneous.  Cord: 3 vessels with the following complications: None.  Anesthesia: Epidural  Episiotomy: None Lacerations: Labial Suture Repair: none Est. Blood Loss (mL): 200  Mom to postpartum.  Baby to Couplet care / Skin to Skin.  Ashmi Blas D 06/12/2015, 5:34 PM

## 2014-12-03 LAB — OB RESULTS CONSOLE HIV ANTIBODY (ROUTINE TESTING): HIV: NONREACTIVE

## 2014-12-03 LAB — OB RESULTS CONSOLE HEPATITIS B SURFACE ANTIGEN: HEP B S AG: NEGATIVE

## 2014-12-03 LAB — OB RESULTS CONSOLE ABO/RH: RH TYPE: POSITIVE

## 2014-12-03 LAB — OB RESULTS CONSOLE ANTIBODY SCREEN: ANTIBODY SCREEN: NEGATIVE

## 2014-12-03 LAB — OB RESULTS CONSOLE RUBELLA ANTIBODY, IGM: Rubella: IMMUNE

## 2014-12-03 LAB — OB RESULTS CONSOLE GC/CHLAMYDIA
Chlamydia: NEGATIVE
GC PROBE AMP, GENITAL: NEGATIVE

## 2014-12-03 LAB — OB RESULTS CONSOLE RPR: RPR: NONREACTIVE

## 2015-02-12 ENCOUNTER — Encounter (HOSPITAL_COMMUNITY): Payer: Self-pay | Admitting: *Deleted

## 2015-03-29 ENCOUNTER — Encounter (HOSPITAL_COMMUNITY): Payer: Self-pay | Admitting: *Deleted

## 2015-03-29 ENCOUNTER — Inpatient Hospital Stay (HOSPITAL_COMMUNITY)
Admission: AD | Admit: 2015-03-29 | Discharge: 2015-03-29 | Disposition: A | Discharge status: Home / Self Care | Payer: Managed Care, Other (non HMO) | Source: Ambulatory Visit | Attending: Obstetrics and Gynecology | Admitting: Obstetrics and Gynecology

## 2015-03-29 ENCOUNTER — Inpatient Hospital Stay (HOSPITAL_COMMUNITY): Payer: Managed Care, Other (non HMO)

## 2015-03-29 DIAGNOSIS — O26892 Other specified pregnancy related conditions, second trimester: Secondary | ICD-10-CM | POA: Diagnosis not present

## 2015-03-29 DIAGNOSIS — O4692 Antepartum hemorrhage, unspecified, second trimester: Secondary | ICD-10-CM

## 2015-03-29 DIAGNOSIS — M545 Low back pain: Secondary | ICD-10-CM | POA: Insufficient documentation

## 2015-03-29 DIAGNOSIS — Z3A27 27 weeks gestation of pregnancy: Secondary | ICD-10-CM

## 2015-03-29 DIAGNOSIS — Z87891 Personal history of nicotine dependence: Secondary | ICD-10-CM | POA: Insufficient documentation

## 2015-03-29 DIAGNOSIS — O26899 Other specified pregnancy related conditions, unspecified trimester: Secondary | ICD-10-CM

## 2015-03-29 DIAGNOSIS — O4402 Placenta previa specified as without hemorrhage, second trimester: Secondary | ICD-10-CM

## 2015-03-29 DIAGNOSIS — O4442 Low lying placenta NOS or without hemorrhage, second trimester: Secondary | ICD-10-CM

## 2015-03-29 DIAGNOSIS — R109 Unspecified abdominal pain: Secondary | ICD-10-CM | POA: Insufficient documentation

## 2015-03-29 DIAGNOSIS — N898 Other specified noninflammatory disorders of vagina: Secondary | ICD-10-CM | POA: Diagnosis present

## 2015-03-29 LAB — URINE MICROSCOPIC-ADD ON

## 2015-03-29 LAB — URINALYSIS, ROUTINE W REFLEX MICROSCOPIC
GLUCOSE, UA: NEGATIVE mg/dL
KETONES UR: 15 mg/dL — AB
NITRITE: NEGATIVE
PH: 6 (ref 5.0–8.0)
Protein, ur: NEGATIVE mg/dL
Specific Gravity, Urine: 1.02 (ref 1.005–1.030)
Urobilinogen, UA: 2 mg/dL — ABNORMAL HIGH (ref 0.0–1.0)

## 2015-03-29 LAB — WET PREP, GENITAL
Clue Cells Wet Prep HPF POC: NONE SEEN
TRICH WET PREP: NONE SEEN
YEAST WET PREP: NONE SEEN

## 2015-03-29 IMAGING — US US MFM OB LIMITED
1 series · 15 of 28 positions shown · non-contrast
Comparison: none

OBSTETRICS REPORT
(Signed Final [DATE] [DATE])

Service(s) Provided
US MFM OB LIMITED                                     76815.01
US MFM OB TRANSVAGINAL                                76817.2
Indications
Vaginal bleeding in pregnancy, second trimester       [UC]
Abdominal pain in pregnancy                           [UC]
Low lying placenta                                    [UC]
27 weeks gestation of pregnancy
Fetal Evaluation
Num Of Fetuses:    1
Fetal Heart Rate:  153                          bpm
Cardiac Activity:  Observed
Presentation:      Cephalic
Placenta:          Posterior, low-lying, .8
cm from interna
Amniotic Fluid
AFI FV:      Subjectively within normal limits
Larg Pckt:     4.6  cm
Gestational Age
Clinical EDD:  27w 1d                                        EDD:   [DATE]
Best:          27w 1d     Det. By:  Clinical EDD             EDD:   [DATE]
Cervix Uterus Adnexa
Cervical Length:    3.9       cm
Cervix:       Normal appearance by transvaginal scan
Left Ovary:    Not visualized.
Right Ovary:   Not visualized.
Impression
INDICATION: 33 yr old [UC] at [UC] with abdominal pain
for fetal ultrasound. Remote read.

[Series 1: us mfm ob limited · 15 of 33 slices shown]
[im 1/33]
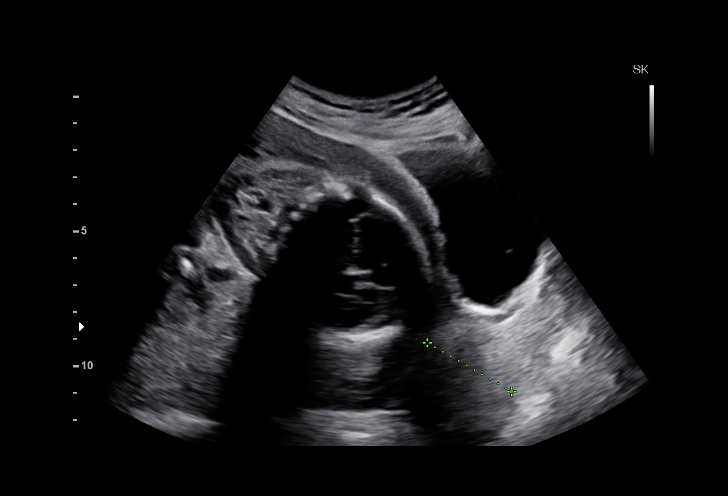
[im 3/33]
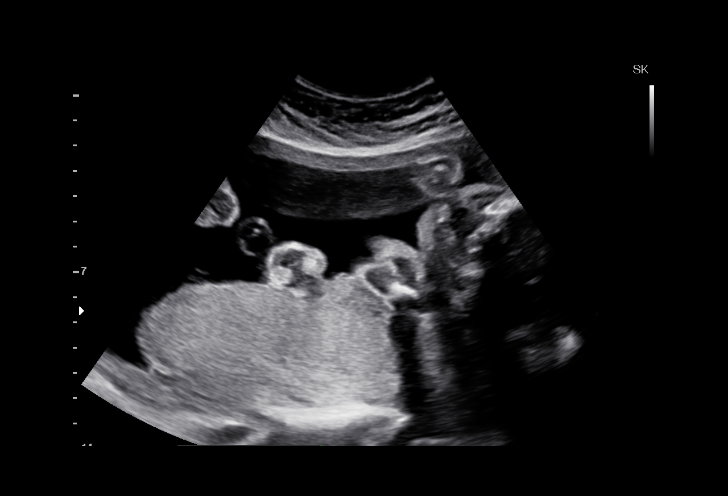
[im 5/33]
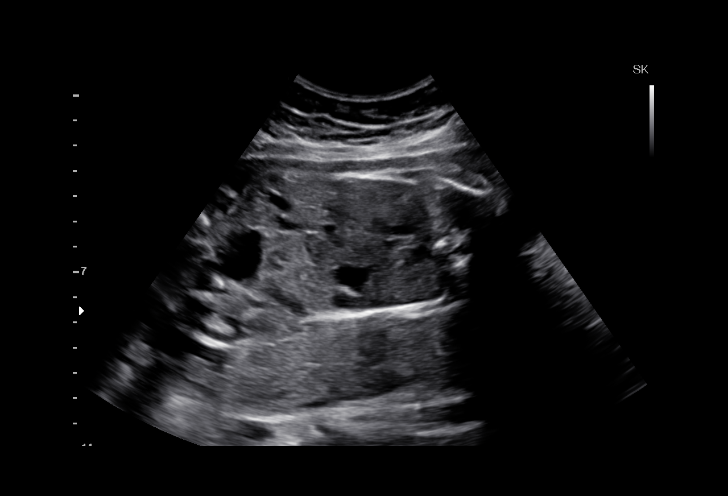
[im 8/33]
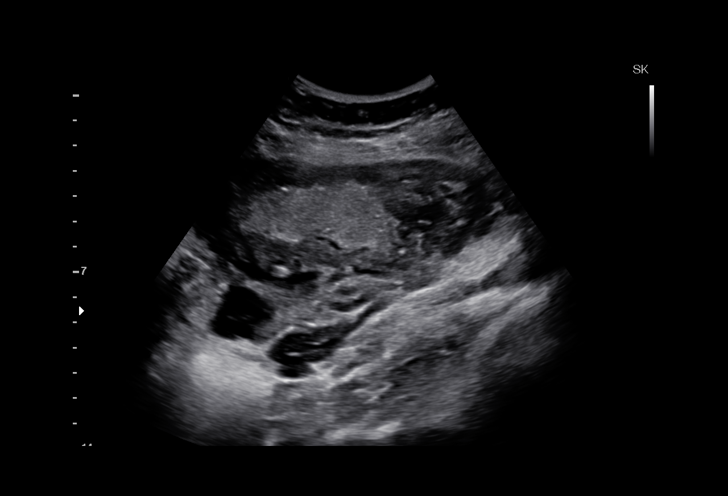
[im 10/33]
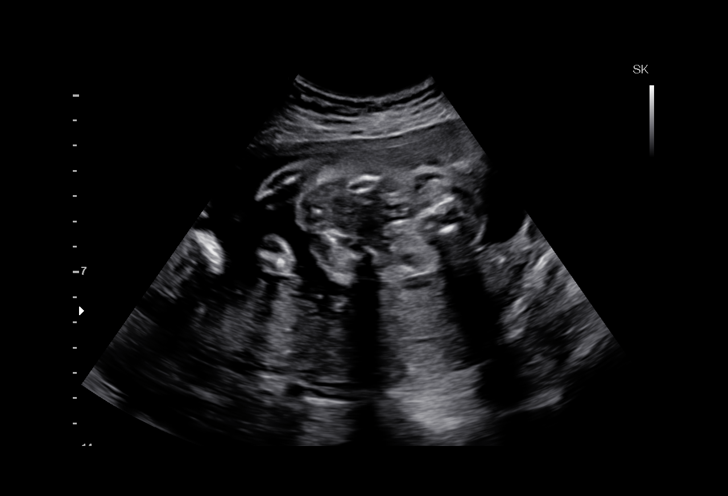
[im 12/33]
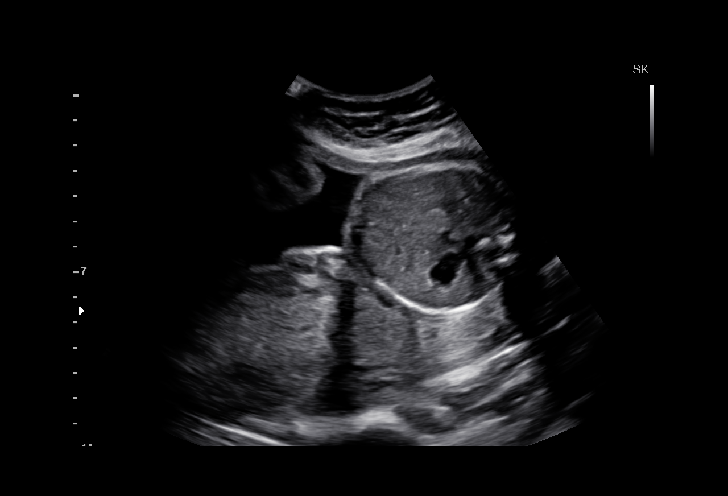
[im 15/33]
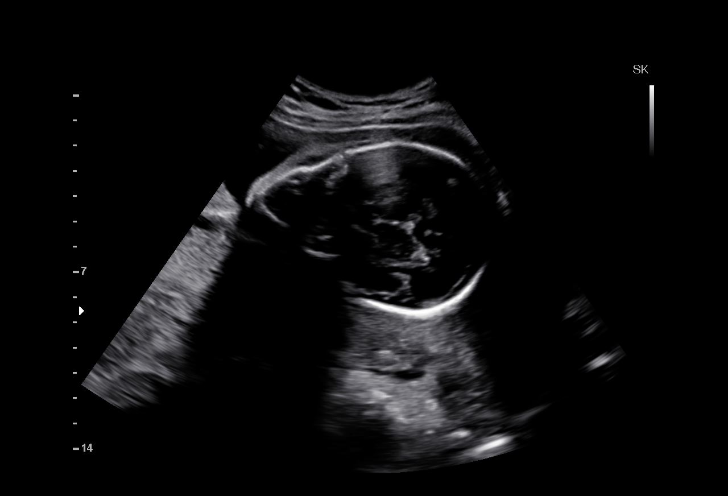
[im 17/33]
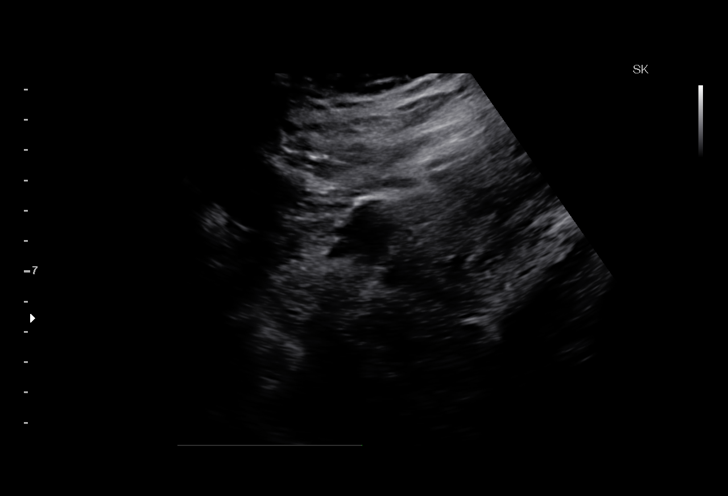
[im 18/33]
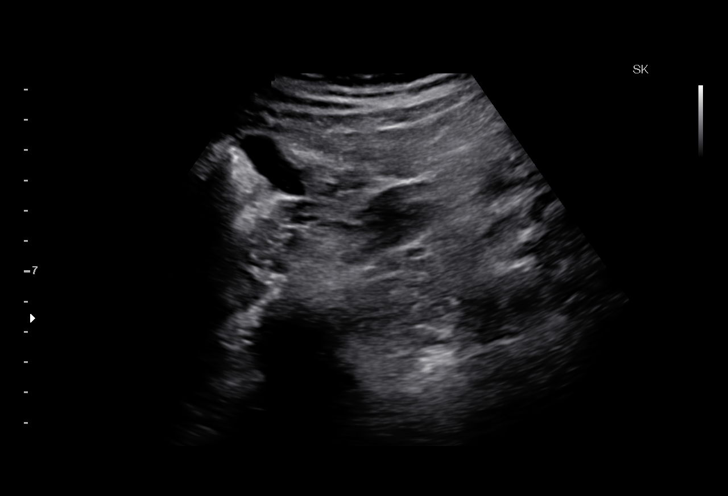
[im 21/33]
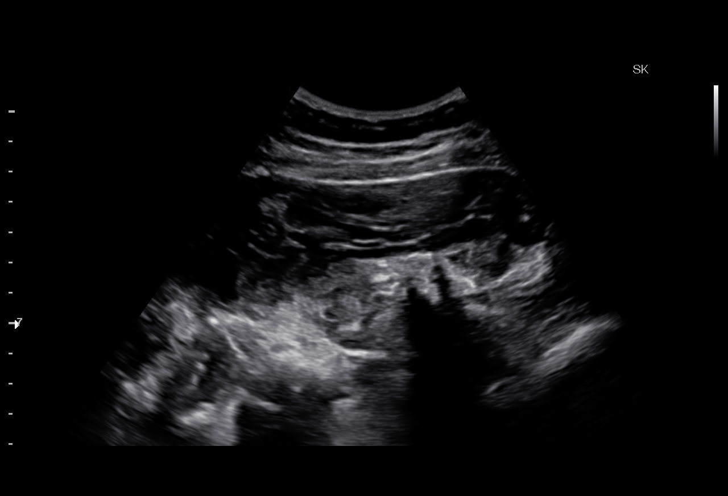
[im 23/33]
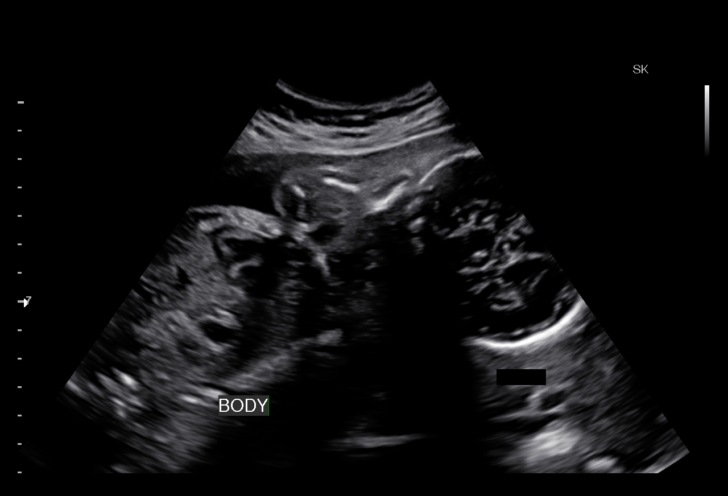
[im 25/33]
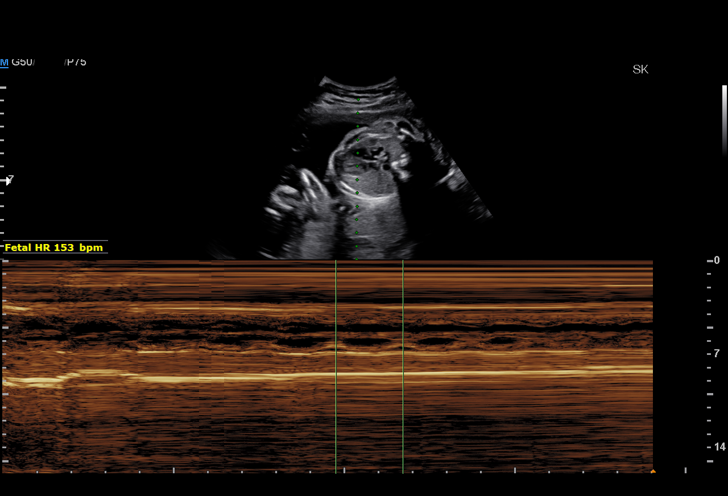
[im 28/33]
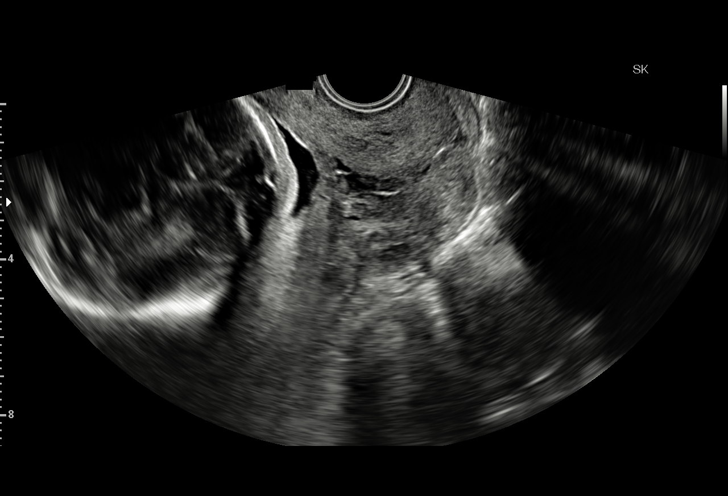
[im 30/33]
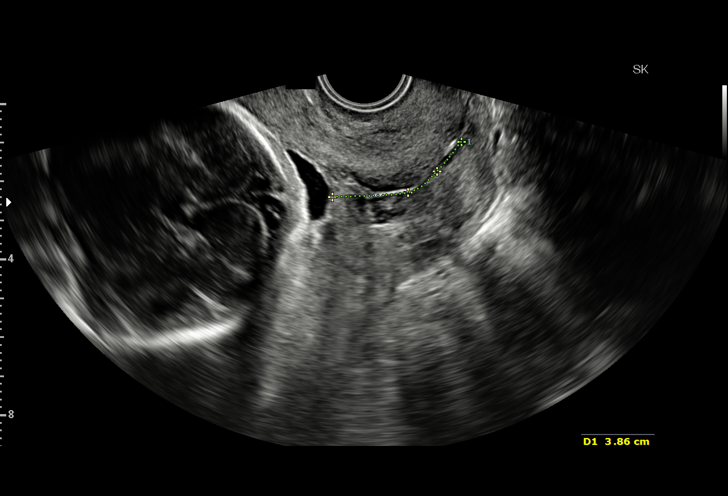
[im 33/33]
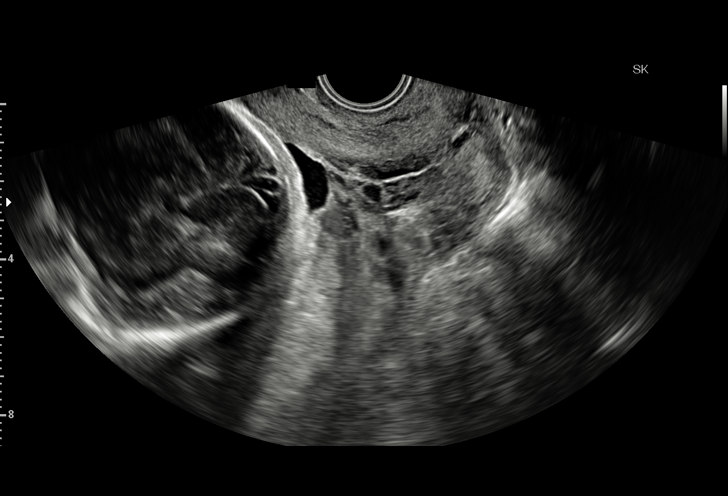

[15 of 28 positions shown; findings below may reference images not displayed]

FINDINGS: 1. Single intrauterine pregnancy.
2. Posterior placenta that is low lying; the placental edge is
8mm from the internal cervical os.
3. Normal amniotic fluid volume.
4. Normal transvaginal cervical length.
Recommendations

1. Normal cervical length.
2. Low lying placenta:
- recommend bleeding precautions
- recommend reevaluate placental location in 4 weeks
3. Management per primary OB

## 2015-03-29 NOTE — MAU Provider Note (Signed)
History     CSN: [id number]  Arrival date and time: 03/29/15 1234   First Provider Initiated Contact with Patient 03/29/15 1358      Chief Complaint  Patient presents with  . Vaginal Discharge  . Abdominal Pain  . Back Pain   Patient is a 34 y.o. female presenting with vaginal discharge, abdominal pain, and back pain.  Vaginal Discharge This is a new problem. The current episode started yesterday. The problem occurs constantly. Associated symptoms include abdominal pain. Pertinent negatives include no fever, nausea or vomiting.  Abdominal Pain The primary symptoms of the illness include abdominal pain and vaginal discharge. The primary symptoms of the illness do not include fever, shortness of breath, nausea, vomiting or dysuria. The current episode started 6 to 12 hours ago. The onset of the illness was gradual.  The vaginal discharge is not associated with dysuria.  The patient states that she believes she is currently pregnant. Additional symptoms associated with the illness include back pain.  Back Pain  Associated symptoms include abdominal pain. Pertinent negatives include no fever and no dysuria.   Sarah Parks is a 34 y.o. [redacted]w[redacted]d gestation who presents to the ED with vaginal d/c that she is concerned about due to the change in color. The d/c started yesterday when she noted yellow/orange color and today is a brown. She also complains of low  Abdominal cramping that radiates to the lower back. She denies n/v, fever, chills or any UTI symptoms. She rates her pain as 2/10.   OB History    Gravida Para Term Preterm AB TAB SAB Ectopic Multiple Living   Past Medical History  Diagnosis Date  . Medical history non-contributory     Past Surgical History  Procedure Laterality Date  . Breast biopsy      Fibroid tissue 2003  . Induced abortion    . Wisdom tooth extraction    . Dilation and evacuation N/A 05/14/2014    Procedure: DILATATION AND  EVACUATION;  Surgeon: Lavina Hamman, MD;  Location: [facility];  Service: Gynecology;  Laterality: N/A;    Family History  Problem Relation Age of Onset  . Hypertension Brother   . Dementia Maternal Grandmother   . Cancer Maternal Grandfather     Social History  Substance Use Topics  . Smoking status: Former Smoker    Quit date: 11/22/2007  . Smokeless tobacco: Never Used  . Alcohol Use: No    Allergies: No Known Allergies  Prescriptions prior to admission  Medication Sig Dispense Refill Last Dose  . Prenatal Vit-Fe Fumarate-FA (PRENATAL MULTIVITAMIN) TABS tablet Take 1 tablet by mouth daily at 12 noon.   03/28/2015 at Unknown time  . ranitidine (ZANTAC) 150 MG capsule Take 150 mg by mouth 2 (two) times daily.   03/29/2015 at Unknown time  . metoCLOPramide (REGLAN) 10 MG tablet Take 1 tablet (10 mg total) by mouth every 6 (six) hours as needed for nausea or vomiting. (Patient not taking: Reported on 05/12/2014) 30 tablet 2     Review of Systems  Constitutional: Negative for fever.  Respiratory: Negative for shortness of breath.   Gastrointestinal: Positive for abdominal pain. Negative for nausea and vomiting.  Genitourinary: Positive for vaginal discharge. Negative for dysuria.  Musculoskeletal: Positive for back pain.   Physical Exam   Blood pressure 115/66, pulse 107, temperature 98.1 F (36.7 C), temperature source Oral, resp. rate 18, last  menstrual period 02/26/2014, not currently breastfeeding.  Physical Exam  Nursing note and vitals reviewed. Constitutional: She is oriented to person, place, and time. She appears well-developed and well-nourished. No distress.  HENT:  Head: Normocephalic and atraumatic.  Eyes: Conjunctivae and EOM are normal.  Neck: Normal range of motion. Neck supple.  Cardiovascular: Regular rhythm.  Tachycardia present.   Respiratory: Effort normal and breath sounds normal.  GI: Soft. Bowel sounds are normal. There is no tenderness.  Gravid  consistent with dates  Genitourinary:  External genitalia without lesions, small brown d/c vaginal vault, cervix external os 1 cm, internal os closed, thick, high.   Musculoskeletal: Normal range of motion. She exhibits no edema.  Neurological: She is alert and oriented to person, place, and time.  Skin: Skin is warm and dry.  Psychiatric: She has a normal mood and affect. Her behavior is normal. Thought content normal.   EFM: baseline 145, moderate variability, uterine irritability.   MAU Course  Procedures  MDM Discussed with Dr. Senaida Ores and patient has a hx of low lying placenta. She will be on pelvic rest and follow up in the office.   Assessment and Plan  34 y.o. G3P1011 @ [redacted]w[redacted]d gestation with abdominal cramping and low back pain that she rates 2/10 and scant vaginal bleeding stable for d/c without acute abdomen and no abruption on ultrasound. Discussed with the patient in detail clinical and ultrasound findings and plan of care. Discussed with the patient low lying placenta and need for her to give that information any time she has a problem and speaking with a provider. Patient voices understanding. She will follow up in the office or return for worsening symptoms.   NEESE,HOPE 03/29/2015, 2:01 PM

## 2015-03-29 NOTE — MAU Note (Signed)
Discharge changed to dark brown over the last 2 days, no bright red bleeding.  Also having lower abd & lower back cramping & aching.  Denies LOF.

## 2015-03-29 NOTE — Discharge Instructions (Signed)
Follow up as scheduled for your prenatal visit. No sex, no tampons nothing in the vaginal until follow up.

## 2015-03-30 LAB — GC/CHLAMYDIA PROBE AMP (~~LOC~~) NOT AT ARMC
CHLAMYDIA, DNA PROBE: NEGATIVE
NEISSERIA GONORRHEA: NEGATIVE

## 2015-03-31 LAB — CULTURE, OB URINE

## 2015-04-25 ENCOUNTER — Inpatient Hospital Stay (HOSPITAL_COMMUNITY)
Admission: AD | Admit: 2015-04-25 | Discharge: 2015-04-26 | Disposition: A | Discharge status: Home / Self Care | Payer: Managed Care, Other (non HMO) | Source: Ambulatory Visit | Attending: Obstetrics and Gynecology | Admitting: Obstetrics and Gynecology

## 2015-04-25 DIAGNOSIS — J069 Acute upper respiratory infection, unspecified: Secondary | ICD-10-CM

## 2015-04-25 DIAGNOSIS — O99513 Diseases of the respiratory system complicating pregnancy, third trimester: Secondary | ICD-10-CM | POA: Insufficient documentation

## 2015-04-25 DIAGNOSIS — Z87891 Personal history of nicotine dependence: Secondary | ICD-10-CM | POA: Insufficient documentation

## 2015-04-25 DIAGNOSIS — Z3A31 31 weeks gestation of pregnancy: Secondary | ICD-10-CM | POA: Insufficient documentation

## 2015-04-25 NOTE — MAU Note (Addendum)
Started Weds with sore throat and nasal congestion. Today have cough and throwing up some from coughing so much. Does have low lying placenta.

## 2015-04-26 ENCOUNTER — Encounter (HOSPITAL_COMMUNITY): Payer: Self-pay | Admitting: *Deleted

## 2015-04-26 DIAGNOSIS — O99513 Diseases of the respiratory system complicating pregnancy, third trimester: Secondary | ICD-10-CM | POA: Diagnosis not present

## 2015-04-26 DIAGNOSIS — J069 Acute upper respiratory infection, unspecified: Secondary | ICD-10-CM

## 2015-04-26 DIAGNOSIS — J029 Acute pharyngitis, unspecified: Secondary | ICD-10-CM | POA: Diagnosis present

## 2015-04-26 DIAGNOSIS — Z87891 Personal history of nicotine dependence: Secondary | ICD-10-CM | POA: Diagnosis not present

## 2015-04-26 DIAGNOSIS — Z3A31 31 weeks gestation of pregnancy: Secondary | ICD-10-CM | POA: Diagnosis not present

## 2015-04-26 LAB — URINALYSIS, ROUTINE W REFLEX MICROSCOPIC
Glucose, UA: NEGATIVE mg/dL
Ketones, ur: NEGATIVE mg/dL
LEUKOCYTES UA: NEGATIVE
NITRITE: NEGATIVE
PH: 7 (ref 5.0–8.0)
PROTEIN: NEGATIVE mg/dL
Specific Gravity, Urine: 1.015 (ref 1.005–1.030)
Urobilinogen, UA: 2 mg/dL — ABNORMAL HIGH (ref 0.0–1.0)

## 2015-04-26 LAB — INFLUENZA PANEL BY PCR (TYPE A & B)
H1N1FLUPCR: NOT DETECTED
Influenza A By PCR: NEGATIVE
Influenza B By PCR: NEGATIVE

## 2015-04-26 LAB — URINE MICROSCOPIC-ADD ON

## 2015-04-26 MED ORDER — OSELTAMIVIR PHOSPHATE 75 MG PO CAPS: 75.0000 mg | ORAL_CAPSULE | Freq: Two times a day (BID) | ORAL | Status: DC

## 2015-04-26 NOTE — Progress Notes (Signed)
efm d/ced 

## 2015-04-26 NOTE — Progress Notes (Signed)
Julie Wenzel PA in to see pt 

## 2015-04-26 NOTE — Discharge Instructions (Signed)
Sinusitis, Adult °Sinusitis is redness, soreness, and puffiness (inflammation) of the air pockets in the bones of your face (sinuses). The redness, soreness, and puffiness can cause air and mucus to get trapped in your sinuses. This can allow germs to grow and cause an infection.  °HOME CARE  °· Drink enough fluids to keep your pee (urine) clear or pale yellow. °· Use a humidifier in your home. °· Run a hot shower to create steam in the bathroom. Sit in the bathroom with the door closed. Breathe in the steam 3-4 times a day. °· Put a warm, moist washcloth on your face 3-4 times a day, or as told by your doctor. °· Use salt water sprays (saline sprays) to wet the thick fluid in your nose. This can help the sinuses drain. °· Only take medicine as told by your doctor. °GET HELP RIGHT AWAY IF:  °· Your pain gets worse. °· You have very bad headaches. °· You are sick to your stomach (nauseous). °· You throw up (vomit). °· You are very sleepy (drowsy) all the time. °· Your face is puffy (swollen). °· Your vision changes. °· You have a stiff neck. °· You have trouble breathing. °MAKE SURE YOU:  °· Understand these instructions. °· Will watch your condition. °· Will get help right away if you are not doing well or get worse. °  °This information is not intended to replace advice given to you by your health care provider. Make sure you discuss any questions you have with your health care provider. °  °Document Released: 12/07/2007 Document Revised: 07/11/2014 Document Reviewed: 01/24/2012 °Elsevier Interactive Patient Education ©2016 [employer] °Influenza, Adult °Influenza (flu) is an infection in the mouth, nose, and throat (respiratory tract) caused by a virus. The flu can make you feel very ill. Influenza spreads easily from person to person (contagious).  °HOME CARE  °Only take medicines as told by your doctor. °Use a cool mist humidifier to make breathing easier. °Get plenty of rest until your fever goes away. This  usually takes 3 to 4 days. °Drink enough fluids to keep your pee (urine) clear or pale yellow. °Cover your mouth and nose when you cough or sneeze. °Wash your hands well to avoid spreading the flu. °Stay home from work or school until your fever has been gone for at least 1 full day. °Get a flu shot every year. °GET HELP RIGHT AWAY IF:  °You have trouble breathing or feel short of breath. °Your skin or nails turn blue. °You have severe neck pain or stiffness. °You have a severe headache, facial pain, or earache. °Your fever gets worse or keeps coming back. °You feel sick to your stomach (nauseous), throw up (vomit), or have watery poop (diarrhea). °You have chest pain. °You have a deep cough that gets worse, or you cough up more thick spit (mucus). °MAKE SURE YOU:  °Understand these instructions. °Will watch your condition. °Will get help right away if you are not doing well or get worse. °  °This information is not intended to replace advice given to you by your health care provider. Make sure you discuss any questions you have with your health care provider. °  °Document Released: 03/29/2008 Document Revised: 07/11/2014 Document Reviewed: 09/19/2011 °Elsevier Interactive Patient Education ©2016 [employer] ° °

## 2015-04-26 NOTE — MAU Provider Note (Signed)
History     CSN: [id number]  Arrival date and time: 04/25/15 2336   First Provider Initiated Contact with Patient 04/26/15 0025      Chief Complaint  Patient presents with  . Sore Throat  . Nasal Congestion  . Cough   HPI [name] is a 34 y.o. G3P1011 at [redacted]w[redacted]d who presents to MAU today with complaint of sore throat, nasal congestion and sinus pressure. The patient states that symptoms started on Wednesday. She has also had associated dry cough, headache and subjective fever. She has not taken her temperature prior to arrival. She states that prior to arrival she was coughing so much that she had vomiting. She denies nausea now. She denies abdominal pain, body aches, bleeding, LOF or contractions. She reports good fetal movement. She states that she has a low lying placenta with this pregnancy. She took Tylenol tonight when she felt feverish.   OB History    Gravida Para Term Preterm AB TAB SAB Ectopic Multiple Living   Past Medical History  Diagnosis Date  . Medical history non-contributory     Past Surgical History  Procedure Laterality Date  . Breast biopsy      Fibroid tissue 2003  . Induced abortion    . Wisdom tooth extraction    . Dilation and evacuation N/A 05/14/2014    Procedure: DILATATION AND EVACUATION;  Surgeon: Lavina Hamman, MD;  Location: [facility];  Service: Gynecology;  Laterality: N/A;    Family History  Problem Relation Age of Onset  . Hypertension Brother   . Dementia Maternal Grandmother   . Cancer Maternal Grandfather     Social History  Substance Use Topics  . Smoking status: Former Smoker    Quit date: 11/22/2007  . Smokeless tobacco: Never Used  . Alcohol Use: No    Allergies: No Known Allergies  Prescriptions prior to admission  Medication Sig Dispense Refill Last Dose  . acetaminophen (TYLENOL) 500 MG tablet Take 500 mg by mouth every 6 (six) hours as needed.   04/25/2015 at Unknown time  .  pantoprazole (PROTONIX) 40 MG tablet Take 40 mg by mouth daily.   04/25/2015 at Unknown time  . Prenatal Vit-Fe Fumarate-FA (PRENATAL MULTIVITAMIN) TABS tablet Take 1 tablet by mouth daily at 12 noon.   04/25/2015 at Unknown time  . metoCLOPramide (REGLAN) 10 MG tablet Take 1 tablet (10 mg total) by mouth every 6 (six) hours as needed for nausea or vomiting. (Patient not taking: Reported on 05/12/2014) 30 tablet 2   . ranitidine (ZANTAC) 150 MG capsule Take 150 mg by mouth 2 (two) times daily.   03/29/2015 at Unknown time    Review of Systems  Constitutional: Positive for fever. Negative for malaise/fatigue.  HENT: Positive for congestion and sore throat.   Respiratory: Positive for cough. Negative for sputum production.   Gastrointestinal: Positive for vomiting. Negative for nausea, abdominal pain, diarrhea and constipation.  Genitourinary:       Neg - vaginal bleeding, discharge, LOF  Neurological: Positive for headaches.   Physical Exam   Blood pressure 114/65, pulse 103, temperature 98.3 F (36.8 C), resp. rate 18, height  (1.727 m), weight 192 lb 6.4 oz (87.272 kg), last menstrual period 02/26/2014.  Physical Exam  Nursing note and vitals reviewed. Constitutional: She is oriented to person, place, and time. She appears well-developed and well-nourished. No distress.  HENT:  Head:  Normocephalic and atraumatic.  Right Ear: Tympanic membrane, external ear and ear canal normal.  Left Ear: Tympanic membrane, external ear and ear canal normal.  Nose: Mucosal edema and rhinorrhea present. Right sinus exhibits maxillary sinus tenderness and frontal sinus tenderness. Left sinus exhibits maxillary sinus tenderness and frontal sinus tenderness.  Mouth/Throat: Uvula is midline and mucous membranes are normal. Posterior oropharyngeal erythema present. No oropharyngeal exudate, posterior oropharyngeal edema or tonsillar abscesses.  Cardiovascular: Normal rate.   Respiratory: Effort normal.   GI: Soft. She exhibits no distension and no mass. There is no tenderness. There is no rebound and no guarding.  Lymphadenopathy:       Head (right side): No submental, no submandibular and no tonsillar adenopathy present.       Head (left side): No submental, no submandibular and no tonsillar adenopathy present.    She has no cervical adenopathy.  Neurological: She is alert and oriented to person, place, and time.  Skin: Skin is warm and dry. No erythema.  Psychiatric: She has a normal mood and affect.   Results for orders placed or performed during the hospital encounter of 04/25/15 (from the past 24 hour(s))  Urinalysis, Routine w reflex microscopic (not at [facility])     Status: Abnormal   Collection Time: 04/26/15 12:05 AM  Result Value Ref Range   Color, Urine YELLOW YELLOW   APPearance CLEAR CLEAR   Specific Gravity, Urine 1.015 1.005 - 1.030   pH 7.0 5.0 - 8.0   Glucose, UA NEGATIVE NEGATIVE mg/dL   Hgb urine dipstick TRACE (A) NEGATIVE   Bilirubin Urine SMALL (A) NEGATIVE   Ketones, ur NEGATIVE NEGATIVE mg/dL   Protein, ur NEGATIVE NEGATIVE mg/dL   Urobilinogen, UA 2.0 (H) 0.0 - 1.0 mg/dL   Nitrite NEGATIVE NEGATIVE   Leukocytes, UA NEGATIVE NEGATIVE  Urine microscopic-add on     Status: Abnormal   Collection Time: 04/26/15 12:05 AM  Result Value Ref Range   Squamous Epithelial / LPF RARE RARE   WBC, UA 0-2 <3 WBC/hpf   Bacteria, UA FEW (A) RARE   Urine-Other MUCOUS PRESENT      Fetal Monitoring: Baseline: 140 bpm, moderate variability, + accelerations, no decelerations Contractions: none  MAU Course  Procedures None  MDM Discussed with Dr. Ambrose MantleHenley. Recommends influenza testing today and Rx for Tamiflu since we will not have the results until Monday.   Assessment and Plan  A: SIUP at 366w1d URI symptoms, possible influenza vs viral sinusitis  P: Discharge home Rx for Tamiflu given to patient List of OTC medications for symptomatic relief given with  suggestions Warning signs for worsening condition discussed Patient [facility] for routine prenatal care or sooner PRN Patient may return to MAU as needed or if her condition were to change or worsen   Marny LowensteinJulie N Adrine Hayworth, PA-C  04/26/2015, 12:38 AM

## 2015-05-26 ENCOUNTER — Other Ambulatory Visit (HOSPITAL_COMMUNITY): Payer: Self-pay | Admitting: Obstetrics and Gynecology

## 2015-05-26 DIAGNOSIS — K831 Obstruction of bile duct: Secondary | ICD-10-CM

## 2015-05-26 DIAGNOSIS — O26613 Liver and biliary tract disorders in pregnancy, third trimester: Principal | ICD-10-CM

## 2015-05-27 ENCOUNTER — Ambulatory Visit (HOSPITAL_COMMUNITY)
Admission: RE | Admit: 2015-05-27 | Discharge: 2015-05-27 | Disposition: A | Discharge status: Home / Self Care | Payer: Managed Care, Other (non HMO) | Source: Ambulatory Visit | Attending: Obstetrics and Gynecology | Admitting: Obstetrics and Gynecology

## 2015-05-27 ENCOUNTER — Other Ambulatory Visit (HOSPITAL_COMMUNITY): Payer: Self-pay | Admitting: Obstetrics and Gynecology

## 2015-05-27 DIAGNOSIS — Z3A35 35 weeks gestation of pregnancy: Secondary | ICD-10-CM

## 2015-05-27 DIAGNOSIS — K831 Obstruction of bile duct: Secondary | ICD-10-CM | POA: Diagnosis not present

## 2015-05-27 DIAGNOSIS — O26613 Liver and biliary tract disorders in pregnancy, third trimester: Secondary | ICD-10-CM | POA: Insufficient documentation

## 2015-05-27 IMAGING — US US MFM FETAL BPP W/O NON-STRESS
1 series · 13 of 13 positions shown · non-contrast
Comparison: none

[Series 1: us mfm fetal bpp w/o non-stress · 13 acquisitions, 13 frames shown]
[im 1/13]
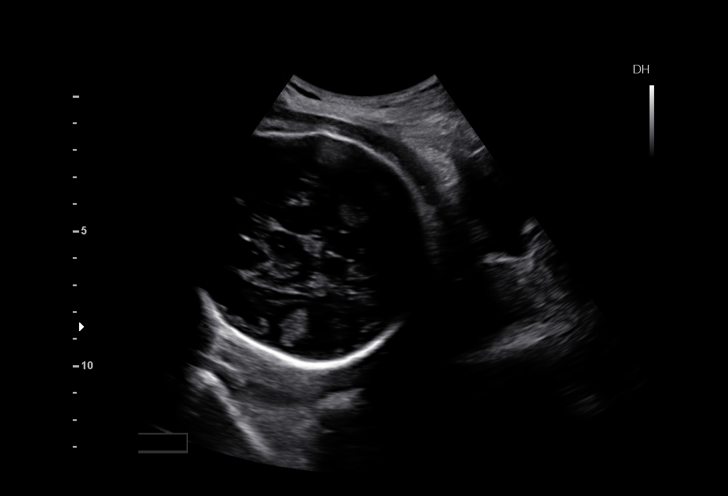
[im 2/13]
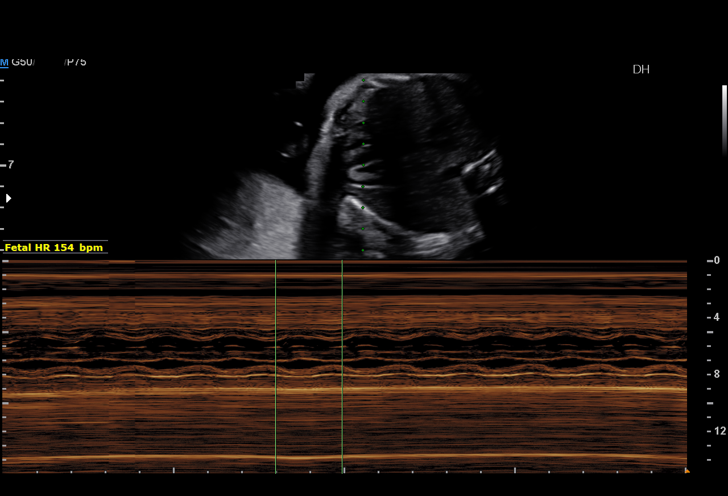
[im 3/13]
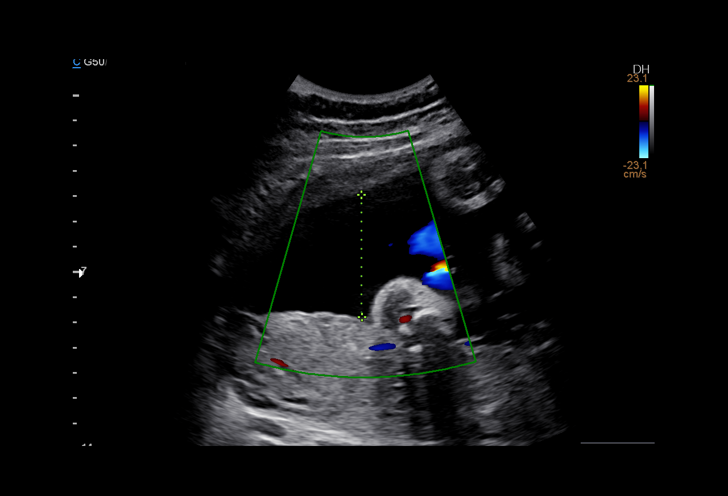
[im 4/13]
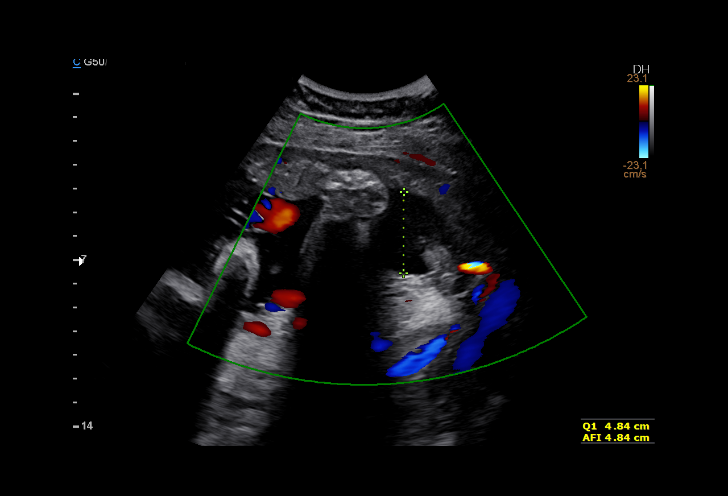
[im 5/13]
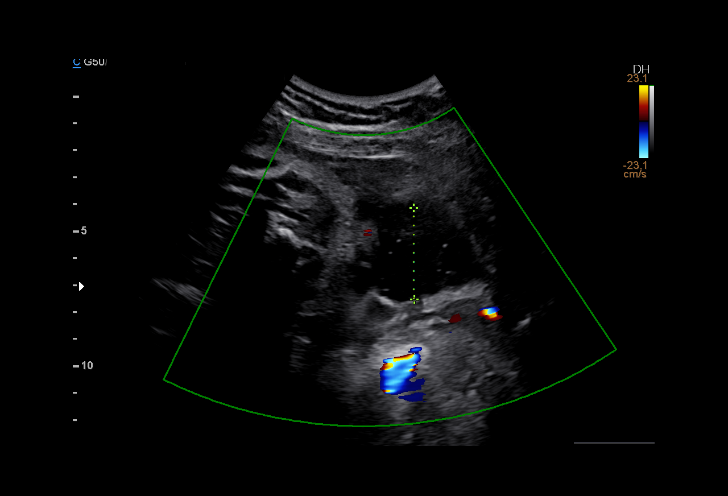
[im 6/13]
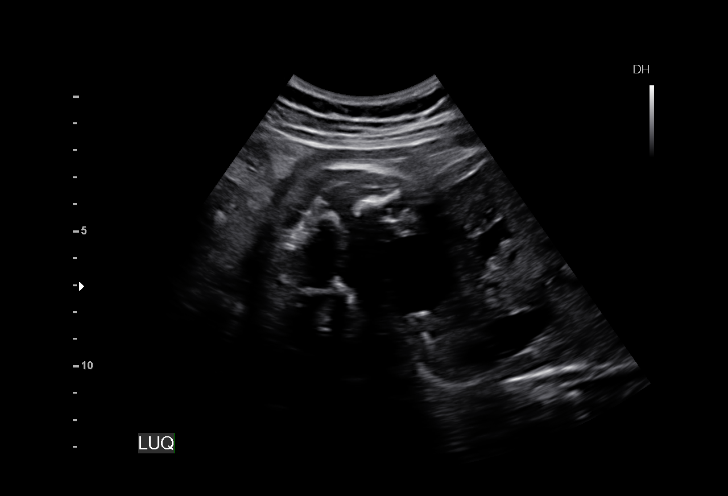
[im 7/13]
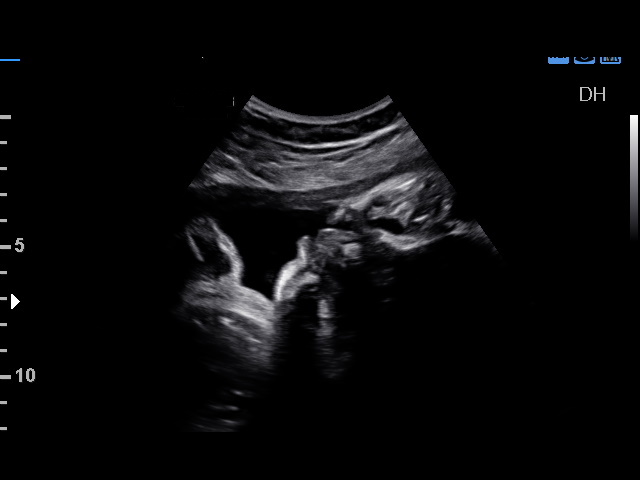
[im 8/13]
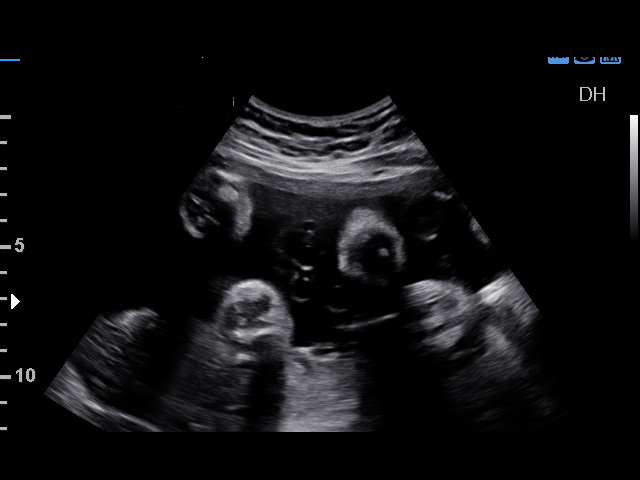
[im 9/13]
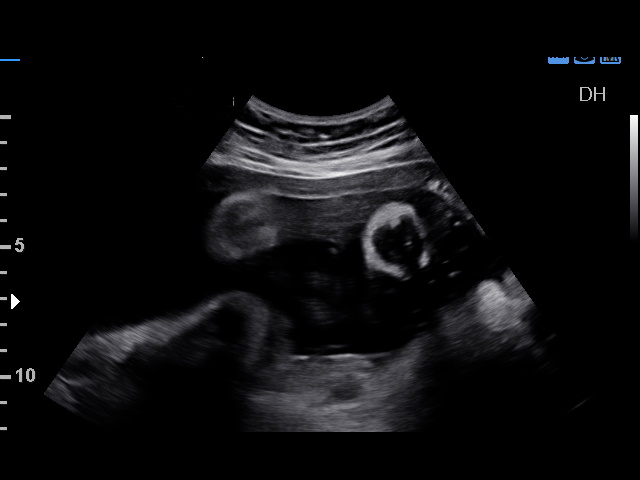
[im 10/13]
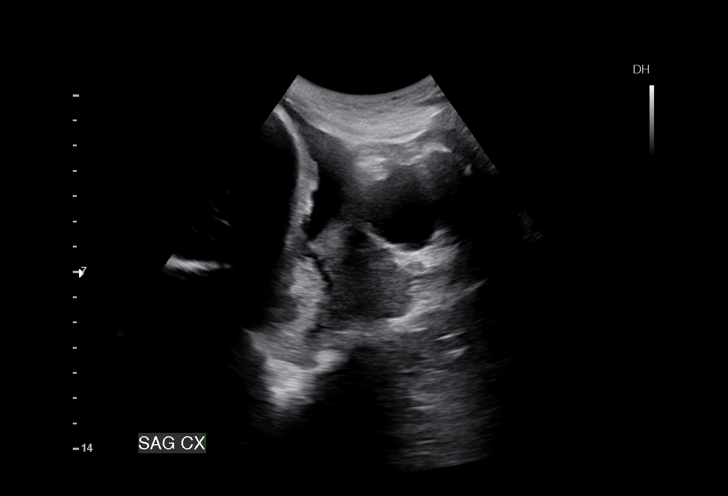
[im 11/13]
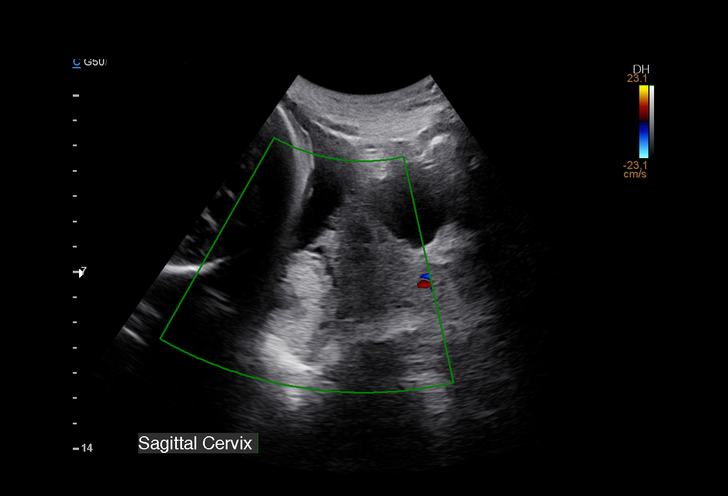
[im 12/13]
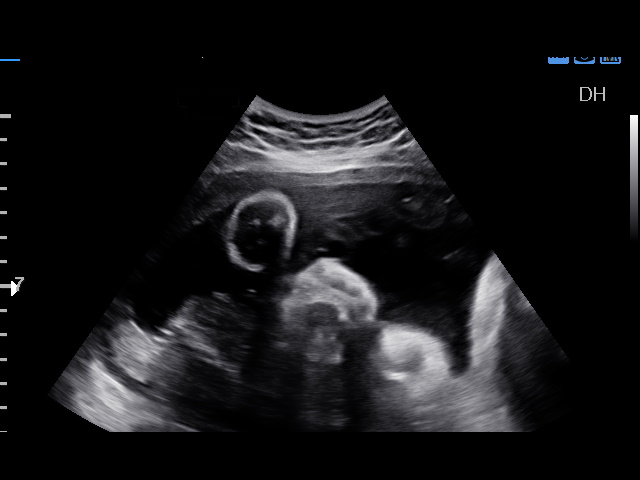
[im 13/13]
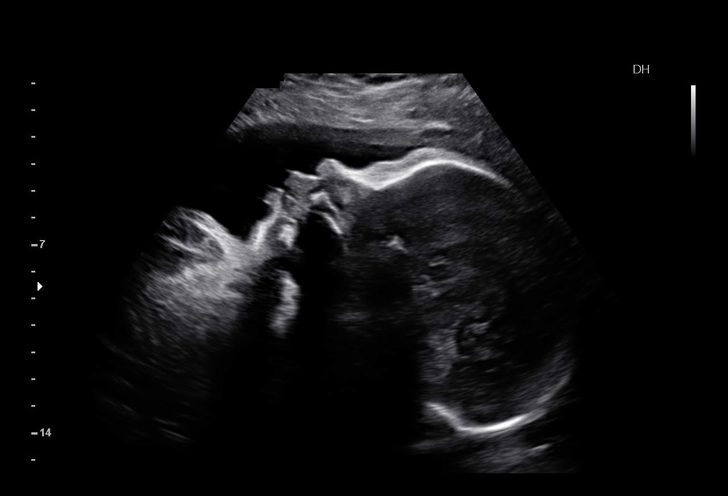

[13 of 13 positions shown; findings below may reference images not displayed]

OBSTETRICS REPORT
(Signed Final [DATE] [DATE])

Name:       HIROHIDE                     Visit  [DATE] [DATE]
Date:

Service(s) Provided

Indications

35 weeks gestation of pregnancy
Cholestasis of pregnancy, third trimester              [63]
[63]
Fetal Evaluation

Num Of             1
Fetuses:
Fetal Heart        154                          bpm
Rate:
Cardiac Activity:  Observed
Presentation:      Cephalic

Amniotic Fluid
AFI FV:      Subjectively within normal limits
AFI Sum:     11.65    cm      34  %Tile     Larg Pckt:    4.84   cm
RUQ:   4.84    cm    LUQ:   3.42    cm   LLQ:    3.39    cm
Biophysical Evaluation

Amniotic F.V:   Pocket => 2 cm two          F. Tone:        Observed
planes
F. Movement:    Observed                    Score:          [DATE]
F. Breathing:   Not Observed
Gestational Age

LMP:           34w 5d        Date:  [DATE]                  EDD:   [DATE]
Best:          35w 4d    Det. By:   Early Ultrasound          EDD:   [DATE]
([DATE])
Impression

Single IUP at 35w 4d
Cholestasis of pregnancy
BPP [DATE] (-2 for absent breathing)
Normal amniotic fluid volume
Recommendations

Continue antenatal testing as scheduled.
Recommend delivery at 37 weeks due to cholestasis of
pregnancy

## 2015-06-02 LAB — OB RESULTS CONSOLE GBS: GBS: NEGATIVE

## 2015-06-05 ENCOUNTER — Inpatient Hospital Stay (HOSPITAL_COMMUNITY)
Admission: AD | Admit: 2015-06-05 | Discharge: 2015-06-05 | Disposition: A | Discharge status: Home / Self Care | Payer: Managed Care, Other (non HMO) | Source: Ambulatory Visit | Attending: Obstetrics and Gynecology | Admitting: Obstetrics and Gynecology

## 2015-06-05 ENCOUNTER — Encounter (HOSPITAL_COMMUNITY): Payer: Self-pay | Admitting: *Deleted

## 2015-06-05 DIAGNOSIS — Z3493 Encounter for supervision of normal pregnancy, unspecified, third trimester: Secondary | ICD-10-CM | POA: Diagnosis present

## 2015-06-05 HISTORY — DX: Obstruction of bile duct: K83.1

## 2015-06-05 NOTE — MAU Note (Addendum)
Lost mucous plug.  Feeling crampy- nothing regular.  Is to be induced next week for cholestasis.

## 2015-06-05 NOTE — Discharge Instructions (Signed)
Keep your scheduled appointment for prenatal care. °

## 2015-06-08 ENCOUNTER — Encounter (HOSPITAL_COMMUNITY): Payer: Self-pay | Admitting: *Deleted

## 2015-06-08 ENCOUNTER — Telehealth (HOSPITAL_COMMUNITY): Payer: Self-pay | Admitting: *Deleted

## 2015-06-08 NOTE — Telephone Encounter (Signed)
Preadmission screen  

## 2015-06-12 ENCOUNTER — Encounter (HOSPITAL_COMMUNITY): Payer: Self-pay

## 2015-06-12 ENCOUNTER — Inpatient Hospital Stay (HOSPITAL_COMMUNITY)
Admission: RE | Admit: 2015-06-12 | Discharge: 2015-06-14 | DRG: 775 | Disposition: A | Discharge status: Home / Self Care | Payer: Managed Care, Other (non HMO) | Source: Ambulatory Visit | Attending: Obstetrics and Gynecology | Admitting: Obstetrics and Gynecology

## 2015-06-12 ENCOUNTER — Inpatient Hospital Stay (HOSPITAL_COMMUNITY): Payer: Managed Care, Other (non HMO) | Admitting: Anesthesiology

## 2015-06-12 DIAGNOSIS — K831 Obstruction of bile duct: Secondary | ICD-10-CM | POA: Diagnosis present

## 2015-06-12 DIAGNOSIS — Z8249 Family history of ischemic heart disease and other diseases of the circulatory system: Secondary | ICD-10-CM | POA: Diagnosis not present

## 2015-06-12 DIAGNOSIS — K219 Gastro-esophageal reflux disease without esophagitis: Secondary | ICD-10-CM | POA: Diagnosis present

## 2015-06-12 DIAGNOSIS — O2662 Liver and biliary tract disorders in childbirth: Principal | ICD-10-CM | POA: Diagnosis present

## 2015-06-12 DIAGNOSIS — Z809 Family history of malignant neoplasm, unspecified: Secondary | ICD-10-CM

## 2015-06-12 DIAGNOSIS — Z810 Family history of intellectual disabilities: Secondary | ICD-10-CM | POA: Diagnosis not present

## 2015-06-12 DIAGNOSIS — O9962 Diseases of the digestive system complicating childbirth: Secondary | ICD-10-CM | POA: Diagnosis present

## 2015-06-12 DIAGNOSIS — O26643 Intrahepatic cholestasis of pregnancy, third trimester: Secondary | ICD-10-CM | POA: Diagnosis present

## 2015-06-12 DIAGNOSIS — Z3A37 37 weeks gestation of pregnancy: Secondary | ICD-10-CM

## 2015-06-12 DIAGNOSIS — O26613 Liver and biliary tract disorders in pregnancy, third trimester: Secondary | ICD-10-CM

## 2015-06-12 DIAGNOSIS — O9902 Anemia complicating childbirth: Secondary | ICD-10-CM | POA: Diagnosis present

## 2015-06-12 DIAGNOSIS — Z87891 Personal history of nicotine dependence: Secondary | ICD-10-CM

## 2015-06-12 LAB — TYPE AND SCREEN
ABO/RH(D): B POS
Antibody Screen: NEGATIVE

## 2015-06-12 LAB — COMPREHENSIVE METABOLIC PANEL
ALBUMIN: 2.6 g/dL — AB (ref 3.5–5.0)
ALK PHOS: 302 U/L — AB (ref 38–126)
ALT: 97 U/L — AB (ref 14–54)
AST: 41 U/L (ref 15–41)
Anion gap: 10 (ref 5–15)
BILIRUBIN TOTAL: 0.7 mg/dL (ref 0.3–1.2)
BUN: 8 mg/dL (ref 6–20)
CALCIUM: 8.6 mg/dL — AB (ref 8.9–10.3)
CO2: 18 mmol/L — ABNORMAL LOW (ref 22–32)
CREATININE: 0.55 mg/dL (ref 0.44–1.00)
Chloride: 105 mmol/L (ref 101–111)
GFR calc Af Amer: >60 mL/min (ref >60)
GLUCOSE: 95 mg/dL (ref 65–99)
POTASSIUM: 4 mmol/L (ref 3.5–5.1)
Sodium: 133 mmol/L — ABNORMAL LOW (ref 135–145)
TOTAL PROTEIN: 6.8 g/dL (ref 6.5–8.1)

## 2015-06-12 LAB — CBC
HCT: 34.6 % — ABNORMAL LOW (ref 36.0–46.0)
HEMOGLOBIN: 11.9 g/dL — AB (ref 12.0–15.0)
MCH: 32.1 pg (ref 26.0–34.0)
MCHC: 34.4 g/dL (ref 30.0–36.0)
MCV: 93.3 fL (ref 78.0–100.0)
PLATELETS: 278 10*3/uL (ref 150–400)
RBC: 3.71 MIL/uL — AB (ref 3.87–5.11)
RDW: 13.2 % (ref 11.5–15.5)
WBC: 10.5 10*3/uL (ref 4.0–10.5)

## 2015-06-12 LAB — RPR: RPR: NONREACTIVE

## 2015-06-12 MED ORDER — EPHEDRINE 5 MG/ML INJ
10.0000 mg | INTRAVENOUS | Status: DC | PRN
  Filled 2015-06-12: qty 2

## 2015-06-12 MED ORDER — DIPHENHYDRAMINE HCL 25 MG PO CAPS: 25.0000 mg | ORAL_CAPSULE | Freq: Four times a day (QID) | ORAL | Status: DC | PRN

## 2015-06-12 MED ORDER — TERBUTALINE SULFATE 1 MG/ML IJ SOLN
0.2500 mg | Freq: Once | INTRAMUSCULAR | Status: DC | PRN
  Filled 2015-06-12: qty 1

## 2015-06-12 MED ORDER — LANOLIN HYDROUS EX OINT: TOPICAL_OINTMENT | CUTANEOUS | Status: DC | PRN

## 2015-06-12 MED ORDER — CITRIC ACID-SODIUM CITRATE 334-500 MG/5ML PO SOLN: 30.0000 mL | ORAL | Status: DC | PRN

## 2015-06-12 MED ORDER — IBUPROFEN 600 MG PO TABS
600.0000 mg | ORAL_TABLET | Freq: Four times a day (QID) | ORAL | Status: DC
  Administered 2015-06-13 (×5): 600 mg via ORAL
  Filled 2015-06-12 (×5): qty 1

## 2015-06-12 MED ORDER — ONDANSETRON HCL 4 MG/2ML IJ SOLN
4.0000 mg | INTRAMUSCULAR | Status: DC | PRN
Start: 2015-06-12 — End: 2015-06-14

## 2015-06-12 MED ORDER — ONDANSETRON HCL 4 MG/2ML IJ SOLN: 4.0000 mg | Freq: Four times a day (QID) | INTRAMUSCULAR | Status: DC | PRN

## 2015-06-12 MED ORDER — ZOLPIDEM TARTRATE 5 MG PO TABS: 5.0000 mg | ORAL_TABLET | Freq: Every evening | ORAL | Status: DC | PRN

## 2015-06-12 MED ORDER — DIBUCAINE 1 % RE OINT: 1.0000 "application " | TOPICAL_OINTMENT | RECTAL | Status: DC | PRN

## 2015-06-12 MED ORDER — MAGNESIUM HYDROXIDE 400 MG/5ML PO SUSP: 30.0000 mL | ORAL | Status: DC | PRN

## 2015-06-12 MED ORDER — DIPHENHYDRAMINE HCL 50 MG/ML IJ SOLN: 12.5000 mg | INTRAMUSCULAR | Status: DC | PRN

## 2015-06-12 MED ORDER — BUTORPHANOL TARTRATE 1 MG/ML IJ SOLN
1.0000 mg | INTRAMUSCULAR | Status: DC | PRN
  Administered 2015-06-12 (×3): 1 mg via INTRAVENOUS
  Filled 2015-06-12 (×3): qty 1

## 2015-06-12 MED ORDER — PRENATAL MULTIVITAMIN CH
1.0000 | ORAL_TABLET | Freq: Every day | ORAL | Status: DC
  Administered 2015-06-13: 1 via ORAL
  Filled 2015-06-12: qty 1

## 2015-06-12 MED ORDER — LIDOCAINE HCL (PF) 1 % IJ SOLN
30.0000 mL | INTRAMUSCULAR | Status: DC | PRN
Start: 2015-06-12 — End: 2015-06-12
  Filled 2015-06-12: qty 30

## 2015-06-12 MED ORDER — PHENYLEPHRINE 40 MCG/ML (10ML) SYRINGE FOR IV PUSH (FOR BLOOD PRESSURE SUPPORT)
80.0000 ug | PREFILLED_SYRINGE | INTRAVENOUS | Status: DC | PRN
  Filled 2015-06-12: qty 2
  Filled 2015-06-12: qty 20

## 2015-06-12 MED ORDER — WITCH HAZEL-GLYCERIN EX PADS: 1.0000 "application " | MEDICATED_PAD | CUTANEOUS | Status: DC | PRN

## 2015-06-12 MED ORDER — SIMETHICONE 80 MG PO CHEW
80.0000 mg | CHEWABLE_TABLET | ORAL | Status: DC | PRN
Start: 2015-06-12 — End: 2015-06-14

## 2015-06-12 MED ORDER — METHYLERGONOVINE MALEATE 0.2 MG/ML IJ SOLN: 0.2000 mg | INTRAMUSCULAR | Status: DC | PRN

## 2015-06-12 MED ORDER — BENZOCAINE-MENTHOL 20-0.5 % EX AERO
1.0000 "application " | INHALATION_SPRAY | CUTANEOUS | Status: DC | PRN
  Administered 2015-06-13 (×2): 1 via TOPICAL
  Filled 2015-06-12 (×2): qty 56

## 2015-06-12 MED ORDER — ONDANSETRON HCL 4 MG PO TABS: 4.0000 mg | ORAL_TABLET | ORAL | Status: DC | PRN

## 2015-06-12 MED ORDER — METHYLERGONOVINE MALEATE 0.2 MG PO TABS: 0.2000 mg | ORAL_TABLET | ORAL | Status: DC | PRN

## 2015-06-12 MED ORDER — OXYTOCIN 40 UNITS IN LACTATED RINGERS INFUSION - SIMPLE MED
62.5000 mL/h | INTRAVENOUS | Status: DC
  Administered 2015-06-12: 62.5 mL/h via INTRAVENOUS

## 2015-06-12 MED ORDER — FENTANYL 2.5 MCG/ML BUPIVACAINE 1/10 % EPIDURAL INFUSION (WH - ANES)
14.0000 mL/h | INTRAMUSCULAR | Status: DC | PRN
  Administered 2015-06-12 (×2): 14 mL/h via EPIDURAL
  Filled 2015-06-12: qty 125

## 2015-06-12 MED ORDER — ACETAMINOPHEN 325 MG PO TABS: 650.0000 mg | ORAL_TABLET | ORAL | Status: DC | PRN

## 2015-06-12 MED ORDER — TETANUS-DIPHTH-ACELL PERTUSSIS 5-2.5-18.5 LF-MCG/0.5 IM SUSP: 0.5000 mL | Freq: Once | INTRAMUSCULAR | Status: DC

## 2015-06-12 MED ORDER — MEASLES, MUMPS & RUBELLA VAC ~~LOC~~ INJ: 0.5000 mL | INJECTION | Freq: Once | SUBCUTANEOUS | Status: DC

## 2015-06-12 MED ORDER — PANTOPRAZOLE SODIUM 40 MG PO TBEC
40.0000 mg | DELAYED_RELEASE_TABLET | Freq: Every day | ORAL | Status: DC
  Filled 2015-06-12: qty 1

## 2015-06-12 MED ORDER — SENNOSIDES-DOCUSATE SODIUM 8.6-50 MG PO TABS
2.0000 | ORAL_TABLET | ORAL | Status: DC
  Administered 2015-06-13: 2 via ORAL
  Filled 2015-06-12: qty 2

## 2015-06-12 MED ORDER — LIDOCAINE HCL (PF) 1 % IJ SOLN
INTRAMUSCULAR | Status: DC | PRN
  Administered 2015-06-12 (×2): 4 mL via EPIDURAL

## 2015-06-12 MED ORDER — HYDROXYZINE HCL 25 MG PO TABS
25.0000 mg | ORAL_TABLET | Freq: Three times a day (TID) | ORAL | Status: DC | PRN
  Filled 2015-06-12: qty 1

## 2015-06-12 MED ORDER — LACTATED RINGERS IV SOLN
500.0000 mL | INTRAVENOUS | Status: DC | PRN
  Administered 2015-06-12: 500 mL via INTRAVENOUS

## 2015-06-12 MED ORDER — OXYCODONE-ACETAMINOPHEN 5-325 MG PO TABS: 1.0000 | ORAL_TABLET | ORAL | Status: DC | PRN

## 2015-06-12 MED ORDER — OXYTOCIN 40 UNITS IN LACTATED RINGERS INFUSION - SIMPLE MED
1.0000 m[IU]/min | INTRAVENOUS | Status: DC
  Administered 2015-06-12: 2 m[IU]/min via INTRAVENOUS
  Filled 2015-06-12: qty 1000

## 2015-06-12 MED ORDER — LACTATED RINGERS IV SOLN
INTRAVENOUS | Status: DC
Start: 2015-06-12 — End: 2015-06-12
  Administered 2015-06-12: 08:00:00 via INTRAVENOUS

## 2015-06-12 MED ORDER — OXYCODONE-ACETAMINOPHEN 5-325 MG PO TABS
1.0000 | ORAL_TABLET | ORAL | Status: DC | PRN
  Administered 2015-06-14: 1 via ORAL
  Filled 2015-06-12: qty 1

## 2015-06-12 MED ORDER — OXYCODONE-ACETAMINOPHEN 5-325 MG PO TABS: 2.0000 | ORAL_TABLET | ORAL | Status: DC | PRN

## 2015-06-12 MED ORDER — OXYTOCIN BOLUS FROM INFUSION: 500.0000 mL | INTRAVENOUS | Status: DC

## 2015-06-12 NOTE — H&P (Signed)
Sarah PeonDanielle G Parks is a 34 y.o. female, G4 P1021, EGA 37+ weeks with EDC 12-30 presenting for induction for cholestasis of pregnancy.  Has elevated bile acids, reactive NSTs, on Ursodiol and hydroxyzine for itching.  Prenatal care otherwise uncomplicated.  Maternal Medical History:  Fetal activity: Perceived fetal activity is normal.      OB History    Gravida Para Term Preterm AB TAB SAB Ectopic Multiple Living   4 1 1  2 1 1   1     SVD at term, no comps  Past Medical History  Diagnosis Date  . Cholestasis    Past Surgical History  Procedure Laterality Date  . Breast biopsy      Fibroid tissue 2003  . Induced abortion    . Wisdom tooth extraction    . Dilation and evacuation N/A 05/14/2014    Procedure: DILATATION AND EVACUATION;  Surgeon: Lavina Hammanodd Reuben Knoblock, MD;  Location: [facility];  Service: Gynecology;  Laterality: N/A;   Family History: family history includes Cancer in her maternal grandfather and mother; Dementia in her maternal grandmother; Hypertension in her brother. Social History:  reports that she quit smoking about 7 years ago. She has never used smokeless tobacco. She reports that she does not drink alcohol or use illicit drugs.   Prenatal Transfer Tool  Maternal Diabetes: No Genetic Screening: Normal Maternal Ultrasounds/Referrals: Normal Fetal Ultrasounds or other Referrals:  None Maternal Substance Abuse:  No Significant Maternal Medications:  None Significant Maternal Lab Results:  Lab values include: Group B Strep negative Other Comments:  cholestasis of pregnancy  Review of Systems  Respiratory: Negative.   Cardiovascular: Negative.     Dilation: 3 Effacement (%): 50 Station: -2 Exam by:: dr Jackelyn Knifemeisinger Blood pressure 116/77, pulse 106, temperature 98.8 F (37.1 C), temperature source Oral, resp. rate 20, height 5\' 8"  (1.727 m), weight 88.905 kg (196 lb), last menstrual period 02/26/2014. Maternal Exam:  Abdomen: Patient reports no abdominal tenderness.  Estimated fetal weight is 7 lbs.   Fetal presentation: vertex  Introitus: Normal vulva. Normal vagina.  Amniotic fluid character: not assessed.  Pelvis: adequate for delivery.   Cervix: Cervix evaluated by digital exam.     Fetal Exam Fetal Monitor Review: Mode: ultrasound.   Baseline rate: 140.  Variability: moderate (6-25 bpm).   Pattern: accelerations present and no decelerations.    Fetal State Assessment: Category I - tracings are normal.     Physical Exam  Vitals reviewed. Constitutional: She appears well-developed and well-nourished.  Cardiovascular: Normal rate, regular rhythm and normal heart sounds.   No murmur heard. Respiratory: Effort normal and breath sounds normal. No respiratory distress. She has no wheezes.  GI: Soft.    Prenatal labs: ABO, Rh: B/Positive/-- (06/01 0000) Antibody: Negative (06/01 0000) Rubella: Immune (06/01 0000) RPR: Nonreactive (06/01 0000)  HBsAg: Negative (06/01 0000)  HIV: Non-reactive (06/01 0000)  GBS: Negative (11/29 0000)   Assessment/Plan: IUP at 37+ weeks with cholestasis of pregnancy, for induction.  Will start pitocin, monitor progress, AROM at lower station, anticipate SVD.   Demira Gwynne D 06/12/2015, 8:17 AM

## 2015-06-12 NOTE — Lactation Note (Signed)
This note was copied from the chart of Sarah Tod PersiaDanielle Prowse. Lactation Consultation Note  Patient Name: Sarah Parks ZOXWR'UToday's Date: 06/12/2015 Reason for consult: Initial assessment Baby at 4 hr of life and mom reports feeding are going well. Demonstrated manual expression, colostrum noted bilaterally. Suggested that mom manually express into a spoon and feed back after each bf today. Set up DEBP for mom to use after feedings tomorrow. Discussed LPT infant guidelines and supplementing with her milk. Mom requested that baby not have a bottle, she feels most comfortable with the spoon but may like to try a cup or finger feedings tomorrow. Discussed baby behavior, feeding frequency, baby belly size, voids, wt loss, breast changes, and nipple care. Given lactation handouts and LPT infant handout. Aware of OP services and support group.     Maternal Data Formula Feeding for Exclusion: No Has patient been taught Hand Expression?: Yes Does the patient have breastfeeding experience prior to this delivery?: No  Feeding Feeding Type: Breast Fed Length of feed: 20 min  LATCH Score/Interventions Latch: Grasps breast easily, tongue down, lips flanged, rhythmical sucking.  Audible Swallowing: Spontaneous and intermittent Intervention(s): Skin to skin  Type of Nipple: Everted at rest and after stimulation  Comfort (Breast/Nipple): Soft / non-tender     Hold (Positioning): No assistance needed to correctly position infant at breast.  LATCH Score: 10  Lactation Tools Discussed/[facility] Pump Review: Setup, frequency, and cleaning;Milk Storage Initiated by:: ES Date initiated:: 06/12/15   Consult Status Consult Status: Follow-up Date: 06/13/15 Follow-up type: In-patient    Sarah Parks 06/12/2015, 9:44 PM

## 2015-06-12 NOTE — Anesthesia Preprocedure Evaluation (Addendum)
Anesthesia Evaluation  Patient identified by MRN, date of birth, ID band Patient awake    Reviewed: Allergy & Precautions, Patient's Chart, lab work & pertinent test results  Airway Mallampati: III  TM Distance: >3 FB Neck ROM: Full    Dental no notable dental hx. (+) Teeth Intact   Pulmonary former smoker,    Pulmonary exam normal breath sounds clear to auscultation       Cardiovascular negative cardio ROS Normal cardiovascular exam Rhythm:Regular Rate:Normal     Neuro/Psych negative neurological ROS  negative psych ROS   GI/Hepatic GERD  ,Cholestasis of pregnancy   Endo/Other  negative endocrine ROS  Renal/GU negative Renal ROS  negative genitourinary   Musculoskeletal negative musculoskeletal ROS (+)   Abdominal   Peds  Hematology  (+) anemia ,   Anesthesia Other Findings   Reproductive/Obstetrics (+) Pregnancy                            Anesthesia Physical Anesthesia Plan  ASA: II  Anesthesia Plan: Epidural   Post-op Pain Management:    Induction:   Airway Management Planned: Natural Airway  Additional Equipment:   Intra-op Plan:   Post-operative Plan:   Informed Consent: I have reviewed the patients History and Physical, chart, labs and discussed the procedure including the risks, benefits and alternatives for the proposed anesthesia with the patient or authorized representative who has indicated his/her understanding and acceptance.     Plan Discussed with: Anesthesiologist  Anesthesia Plan Comments:         Anesthesia Quick Evaluation

## 2015-06-12 NOTE — Anesthesia Procedure Notes (Signed)
Epidural Patient location during procedure: OB Start time: 06/12/2015 3:30 PM  Staffing Anesthesiologist: Mal AmabileFOSTER, Nissi Doffing Performed by: anesthesiologist   Preanesthetic Checklist Completed: patient identified, site marked, surgical consent, pre-op evaluation, timeout performed, IV checked, risks and benefits discussed and monitors and equipment checked  Epidural Patient position: sitting Prep: site prepped and draped and DuraPrep Patient monitoring: continuous pulse ox and blood pressure Approach: midline Location: L3-L4 Injection technique: LOR air  Needle:  Needle type: Tuohy  Needle gauge: 17 G Needle length: 9 cm and 9 Needle insertion depth: 5 cm cm Catheter type: closed end flexible Catheter size: 19 Gauge Catheter at skin depth: 10 cm Test dose: negative and Other  Assessment Events: blood not aspirated, injection not painful, no injection resistance, negative IV test and no paresthesia  Additional Notes Patient identified. Risks and benefits discussed including failed block, incomplete  Pain control, post dural puncture headache, nerve damage, paralysis, blood pressure Changes, nausea, vomiting, reactions to medications-both toxic and allergic and post Partum back pain. All questions were answered. Patient expressed understanding and wished to proceed. Sterile technique was used throughout procedure. Epidural site was Dressed with sterile barrier dressing. No paresthesias, signs of intravascular injection Or signs of intrathecal spread were encountered.  Patient was more comfortable after the epidural was dosed. Please see RN's note for documentation of vital signs and FHR which are stable.

## 2015-06-12 NOTE — Progress Notes (Signed)
Comfortable with epidural Afeb, VSS FHT- Cat I, ctx q 2-3 min VE-9/C/0 Continue pitocin, anticipate SVD

## 2015-06-12 NOTE — Progress Notes (Signed)
Feeling some ctx Afeb, VSS FHT- Cat I, ctx q 2-4 min VE-3-4/50/-2, vtx, AROM clear Continue pitocin, monitor progress

## 2015-06-13 NOTE — Progress Notes (Signed)
PPD #1 No problems Afeb, VSS Fundus firm, NT at U-1 Continue routine postpartum care 

## 2015-06-13 NOTE — Anesthesia Postprocedure Evaluation (Signed)
Anesthesia Post Note  Patient: Sarah Parks  Procedure(s) Performed: * No procedures listed *  Patient location during evaluation: Mother Baby Anesthesia Type: Epidural Level of consciousness: awake and alert and oriented Pain management: pain level controlled Vital Signs Assessment: post-procedure vital signs reviewed and stable Respiratory status: spontaneous breathing Cardiovascular status: stable and blood pressure returned to baseline Postop Assessment: no headache, no backache, patient able to bend at knees, no signs of nausea or vomiting, adequate PO intake and epidural receding Anesthetic complications: no    Last Vitals:  Filed Vitals:   06/13/15 0028 06/13/15 0602  BP: 122/73 106/61  Pulse: 81 74  Temp: 36.9 C 36.7 C  Resp: 20 20    Last Pain:  Filed Vitals:   06/13/15 0620  PainSc: 0-No pain                 Hikaru Delorenzo

## 2015-06-14 MED ORDER — IBUPROFEN 600 MG PO TABS: 600.0000 mg | ORAL_TABLET | Freq: Four times a day (QID) | ORAL | Status: DC

## 2015-06-14 MED ORDER — OXYCODONE-ACETAMINOPHEN 5-325 MG PO TABS: 1.0000 | ORAL_TABLET | ORAL | Status: DC | PRN

## 2015-06-14 NOTE — Progress Notes (Signed)
PPD #2 Doing ok Afeb, VSS D/c home 

## 2015-06-14 NOTE — Discharge Instructions (Signed)
As per discharge pamphlet °

## 2015-06-14 NOTE — Discharge Summary (Signed)
**Note Sarah-Identified via Obfuscation** OB Discharge Summary     Patient Name: Sarah Parks DOB: 10-25-1980 MRN: [id number]  Date of admission: 06/12/2015 DeliveriDenny Peonng MD: Jackelyn KnifeMEISINGER, Damiana Berrian   Date of discharge: 06/14/2015  Admitting diagnosis: INDUCTION Intrauterine pregnancy: 658w6d     Secondary diagnosis:  Active Problems:   Cholestasis of pregnancy in third trimester   SVD (spontaneous vaginal delivery)      Discharge diagnosis: Term Pregnancy Delivered and cholestasis of pregnancy                                                                                                 Augmentation: AROM and Pitocin  Complications: None  Hospital course:  Induction of Labor With Vaginal Delivery   34 y.o. yo [id number] at 698w6d was admitted to the hospital 06/12/2015 for induction of labor.  Indication for induction: Cholestasis of pregnancy.  Patient had an uncomplicated labor course as follows: Membrane Rupture Time/Date: 12:00 PM ,06/12/2015   Intrapartum Procedures: Episiotomy: None [1]                                         Lacerations:  Labial [10]  Patient had delivery of a Viable infant.  Information for the patient's newborn:  Fabiola BackerMorgan, Girl Catilyn [[id number]][[id number]]  Delivery Method: Vaginal, Spontaneous Delivery (Filed from Delivery Summary)   06/12/2015  Details of delivery can be found in separate delivery note.  Patient had a routine postpartum course. Patient is discharged home No discharge date for patient encounter.    Physical exam  Filed Vitals:   06/13/15 0028 06/13/15 0602 06/13/15 1711 06/14/15 0540  BP: 122/73 106/61 105/64 98/60  Pulse: 81 74 75 72  Temp: 98.4 F (36.9 C) 98 F (36.7 C) 98.2 F (36.8 C) 98 F (36.7 C)  TempSrc: Oral Oral Oral   Resp: 20 20 18 18   Height:      Weight:      SpO2:       General: alert Lochia: appropriate Uterine Fundus: firm  Labs: Lab Results  Component Value Date   WBC 10.5 06/12/2015   HGB 11.9* 06/12/2015   HCT 34.6* 06/12/2015   MCV 93.3  06/12/2015   PLT 278 06/12/2015   CMP Latest Ref Rng 06/12/2015  Glucose 65 - 99 mg/dL 95  BUN 6 - 20 mg/dL 8  Creatinine 1.300.44 - 8.651.00 mg/dL 7.840.55  Sodium 696135 - 295145 mmol/L 133(L)  Potassium 3.5 - 5.1 mmol/L 4.0  Chloride 101 - 111 mmol/L 105  CO2 22 - 32 mmol/L 18(L)  Calcium 8.9 - 10.3 mg/dL 2.8(U8.6(L)  Total Protein 6.5 - 8.1 g/dL 6.8  Total Bilirubin 0.3 - 1.2 mg/dL 0.7  Alkaline Phos 38 - 126 U/L 302(H)  AST 15 - 41 U/L 41  ALT 14 - 54 U/L 97(H)    Discharge instruction: per After Visit Summary and "Baby and Me Booklet".  After visit meds:    Medication List    STOP taking these medications  hydrOXYzine 25 MG tablet  Commonly known as:  ATARAX/VISTARIL     ursodiol 300 MG capsule  Commonly known as:  ACTIGALL      TAKE these medications        ibuprofen 600 MG tablet  Commonly known as:  ADVIL,MOTRIN  Take 1 tablet (600 mg total) by mouth every 6 (six) hours.     oxyCODONE-acetaminophen 5-325 MG tablet  Commonly known as:  PERCOCET/ROXICET  Take 1-2 tablets by mouth every 4 (four) hours as needed for severe pain.     pantoprazole 40 MG tablet  Commonly known as:  PROTONIX  Take 40 mg by mouth daily.     prenatal multivitamin Tabs tablet  Take 1 tablet by mouth daily at 12 noon.        Diet: routine diet  Activity: Advance as tolerated. Pelvic rest for 6 weeks.   Outpatient follow up:6 weeks   Newborn Data: Live born female  Birth Weight: 5 lb 10.8 oz (2575 g) APGAR: 9, 9  Baby Feeding: Breast Disposition:home with mother   06/14/2015 Zenaida Niece, MD

## 2015-07-24 ENCOUNTER — Ambulatory Visit (HOSPITAL_COMMUNITY)
Admission: RE | Admit: 2015-07-24 | Discharge: 2015-07-24 | Disposition: A | Discharge status: Home / Self Care | Payer: Managed Care, Other (non HMO) | Source: Ambulatory Visit | Attending: Obstetrics and Gynecology | Admitting: Obstetrics and Gynecology

## 2015-07-24 NOTE — Lactation Note (Signed)
Lactation Consult: Sarah Parks had fed about 1 hour ago at the ped office. Sarah Parks needed some assist with getting the baby deep on to the breast. She is letting her nibble onto the tip of the nipple. Reviewed wide open mouth and keeping her close to the breast throughout the feeding.Sarah Parks reports this feels better. Nursed for 15 min then off the breast. Some non nutritive sucking noted toward end of feeding.Attempted on left breast - she only did a few minuites then came off the breast and would not latch back on. Discussed growth spurt- common at 6 Sarah Parks. Sarah Parks using Ameda pump- reports it hurt at first but she has turned down suction and it feels better.  Is taking Fenugreek- it only taking 1-2 tablets a day. Suggested increasing amount or to try Mother Love tea, .Reassurance given. Suggested BFSG as resource for support and to follow weight gain No questions at present. To call prn  Mother's reason for visit:  Baby is 30 Sarah Parks old and Sarah Parks's not sure if baby is getting enough. Very fussy, Having greenish, slimy stools Visit Type:  Feeding assessment Appointment Notes:  Just came from [facility] office, not concerned about stools, baby does not have yeast Consult:  Initial Lactation Consultant:  Audry Riles D  ________________________________________________________________________   Sarah Parks Name: Sarah Parks Date of Birth: 06/12/2014 Pediatrician: Chestine Spore Gender: female Gestational Age: [redacted]w[redacted]d (At Birth) Birth Weight: 5 lb 10.8 oz (2575 g) Weight at Discharge: Weight: 5 lb 5.5 oz (2425 g)Date of Discharge: 06/14/2015 Filed Weights   06/12/15 1722 06/13/15 0040 06/14/15 0020  Weight: 5 lb 10.8 oz (2575 g) 5 lb 10.5 oz (2565 g) 5 lb 5.5 oz (2425 g)     Weight today 9- 4.6  4212g ________________________________________________________________________  Mother's Name: Sarah Parks Breastfeeding Experience:   P1    ________________________________________________________________________  Breastfeeding History (Post Discharge)  Frequency of breastfeeding:  q 2-3 hours or more often, very fussy from 9 pm to 1 am Duration of feeding: 30-45 min    Pumping  Type of pump:  Ameda Frequency:  2 times/day Volume:  90 ml  Infant Intake and Output Assessment  Voids:  8+ in 24 hrs.  Color:  Clear yellow  Had 2 large voids while here for appointment Stools:  6+ in 24 hrs.  Color:  Green  ________________________________________________________________________  Maternal Breast Assessment  Breast:  Soft Nipple:  Erect _______________________________________________________________________ Feeding Assessment/Evaluation  Initial feeding assessment:  Positioning:  Cradle Right breast  LATCH documentation:  Latch:  2 = Grasps breast easily, tongue down, lips flanged, rhythmical sucking.  Audible swallowing:  1 = A few with stimulation  Type of nipple:  2 = Everted at rest and after stimulation  Comfort (Breast/Nipple):  2 = Soft / non-tender  Hold (Positioning):  1 = Assistance needed to correctly position infant at breast and maintain latch  LATCH score:  8  Attached assessment:  Deep after assist  Lips flanged:  Yes.    Lips untucked:  No.  Suck assessment:  Displays both        Pre feeding weight: 4212 g 9- 4.6 oz Post feeding weight- 4228 g  9-  5.1 oz Transferred: 16 ml:    Pre feeding weight: 4228 g  9- 5.1   Post feeding weight: 4232  9- 5.3   Amount transferred: 4 ml  Total amount transferred:  20  ml

## 2015-09-09 ENCOUNTER — Telehealth (HOSPITAL_COMMUNITY): Payer: Self-pay | Admitting: Lactation Services

## 2015-09-09 NOTE — Telephone Encounter (Signed)
Baby refusing bottle of EBM, prefers to nurse.

## 2015-10-29 ENCOUNTER — Telehealth (HOSPITAL_COMMUNITY): Payer: Self-pay | Admitting: Lactation Services

## 2015-10-29 NOTE — Telephone Encounter (Signed)
Mom called and I returned her call. States baby is 24 1/2 moths old and baby is cutting teeth and biting. Nipples are very sore now. Encouraged to watch baby closely and to take her off the breast when she is not nursing. Encouraged to rub EBM into nipples and watch for signs of infection. Reviewed with mom and encouraged to call MD if notes any of these.No further questions at present.

## 2016-03-23 ENCOUNTER — Ambulatory Visit (HOSPITAL_COMMUNITY)
Admission: RE | Admit: 2016-03-23 | Discharge: 2016-03-23 | Disposition: A | Discharge status: Home / Self Care | Payer: Managed Care, Other (non HMO) | Source: Ambulatory Visit | Attending: Obstetrics and Gynecology | Admitting: Obstetrics and Gynecology

## 2016-03-23 NOTE — Lactation Note (Signed)
Lactation Consult: Mom here today because she wants to discuss weaning and baby's nursing patterns. Reports baby is still feeding q 2 hours during the day and feeds q hour from 3-6 am. Only feeds for 5 min at most feedings. Is now 609 months old and eating 3 meals a day of solid foods. Baby is cutting teeth. Has 4 teeth already. Weight today 18 # 4.1 oz Reports good weight gain. Baby latched but on and off the breast- curious about new environment. Pulling on nipple- mom reports no pain with nursing unless she bites. Mom is tired. Suggested feeding EBM in a cup instead of latching. Mom is not pumping as she is home with the baby. Reports she needs larger flanges. #27 and #30 given for mom to try. Baby nursed on and off the breast for maybe 5 min total weight after nursing 8-# 5.8 oz. Dad works 3rd shift and isn't around to help at night. Suggested to start weaning during the daytime and leave night time feedings for last.   Encouragement given. Asking about BFSG- encouraged to attend as desired. No further questions at present. To call prn  Mother's reason for visit:  Mom here today to discuss weaning Appointment Notes:  409 months old and teething Consult:  Follow-Up Lactation Consultant:  Audry RilesWeeks, Gavynn Duvall D  ________________________________________________________________________    ________________________________________________________________________

## 2016-07-28 DIAGNOSIS — Z803 Family history of malignant neoplasm of breast: Secondary | ICD-10-CM | POA: Insufficient documentation

## 2016-11-17 ENCOUNTER — Encounter: Payer: Self-pay | Admitting: Physician Assistant

## 2016-11-17 ENCOUNTER — Telehealth: Payer: Self-pay | Admitting: General Practice

## 2016-11-17 ENCOUNTER — Ambulatory Visit (INDEPENDENT_AMBULATORY_CARE_PROVIDER_SITE_OTHER): Payer: Managed Care, Other (non HMO) | Admitting: Physician Assistant

## 2016-11-17 VITALS — BP 110/80 | HR 78 | Temp 98.2°F | Ht 68.0 in | Wt 174.0 lb

## 2016-11-17 DIAGNOSIS — J029 Acute pharyngitis, unspecified: Secondary | ICD-10-CM

## 2016-11-17 MED ORDER — AMOXICILLIN 875 MG PO TABS: 875.0000 mg | ORAL_TABLET | Freq: Two times a day (BID) | ORAL | 0 refills | Status: DC

## 2016-11-17 MED ORDER — PREDNISONE 20 MG PO TABS
20.0000 mg | ORAL_TABLET | Freq: Every day | ORAL | 0 refills | Status: DC
Start: 2016-11-17 — End: 2017-01-27

## 2016-11-17 NOTE — Telephone Encounter (Signed)
Noted  

## 2016-11-17 NOTE — Progress Notes (Signed)
Sarah Parks is a 36 y.o. female here for a new problem.  Sarah Parks, cma is acting as a Neurosurgeon for [name], [name].   History of Present Illness:   Chief Complaint  Patient presents with  . Establish Care  . Pain in Throat    Sore Throat   This is a new problem. The current episode started today (3-4am). The problem has been rapidly worsening. There has been no fever. The pain is at a severity of 6/10. The pain is moderate. Associated symptoms include congestion, headaches, a hoarse voice, swollen glands and trouble swallowing. Pertinent negatives include no coughing, shortness of breath or stridor. She has tried nothing for the symptoms. The treatment provided no relief.   Does have sick kid at home, with double ear infection. Has been over 1 year since she has been on an antibiotic. Is currently breastfeeding. No pressure in sinus. No cough.   PMHx, SurgHx, SocialHx, Medications, and Allergies were reviewed in the Visit Navigator and updated as appropriate.  Current Medications:   Current Outpatient Prescriptions:  .  [name] 0.35 MG tablet, , Disp: , Rfl:  .  amoxicillin (AMOXIL) 875 MG tablet, Take 1 tablet (875 mg total) by mouth 2 (two) times daily., Disp: 20 tablet, Rfl: 0 .  predniSONE (DELTASONE) 20 MG tablet, Take 1 tablet (20 mg total) by mouth daily with breakfast., Disp: 5 tablet, Rfl: 0   Review of Systems:   Review of Systems  Constitutional: Positive for chills and fever.  HENT: Positive for congestion, hoarse voice, sinus pain and trouble swallowing.   Eyes: Negative.   Respiratory: Negative.  Negative for cough, shortness of breath and stridor.   Cardiovascular: Negative.   Gastrointestinal: Negative.   Genitourinary: Negative.   Musculoskeletal: Negative.   Neurological: Positive for dizziness and headaches.  Endo/Heme/Allergies: Negative.   Psychiatric/Behavioral: Negative.     Vitals:   Vitals:   11/17/16 1036  BP: 110/80  Pulse: 78   Temp: 98.2 F (36.8 C)  TempSrc: Oral  SpO2: 99%  Weight: 174 lb (78.9 kg)  Height: 5\' 8"  (1.727 m)     Body mass index is 26.46 kg/m.  Physical Exam:   Physical Exam  Constitutional: She appears well-developed. She is cooperative.  Non-toxic appearance. She does not have a sickly appearance. She does not appear ill. No distress.  HENT:  Head: Normocephalic and atraumatic.  Right Ear: Tympanic membrane, external ear and ear canal normal. Tympanic membrane is not erythematous, not retracted and not bulging.  Left Ear: Tympanic membrane, external ear and ear canal normal. Tympanic membrane is not erythematous, not retracted and not bulging.  Nose: Nose normal. Right sinus exhibits no maxillary sinus tenderness and no frontal sinus tenderness. Left sinus exhibits no maxillary sinus tenderness and no frontal sinus tenderness.  Mouth/Throat: Uvula is midline. Posterior oropharyngeal erythema present. No posterior oropharyngeal edema. Tonsils are 0 on the right. Tonsils are 0 on the left. No tonsillar exudate.  Eyes: Conjunctivae and lids are normal.  Neck: Trachea normal.  Cardiovascular: Normal rate, regular rhythm, S1 normal, S2 normal and normal heart sounds.   Pulmonary/Chest: Effort normal and breath sounds normal. She has no decreased breath sounds. She has no wheezes. She has no rhonchi. She has no rales.  Lymphadenopathy:    She has cervical adenopathy.  Neurological: She is alert.  Skin: Skin is warm, dry and intact.  Psychiatric: She has a normal mood and affect. Her speech is normal and behavior is  normal.  Nursing note and vitals reviewed.    Assessment and Plan:    Sarah Parks was seen today for establish care and pain in throat.  Diagnoses and all orders for this visit:  Pharyngitis, unspecified etiology  Other orders -     predniSONE (DELTASONE) 20 MG tablet; Take 1 tablet (20 mg total) by mouth daily with breakfast. -     amoxicillin (AMOXIL) 875 MG tablet; Take 1  tablet (875 mg total) by mouth 2 (two) times daily.   Throat appears benign and vital signs are stable. We discussed the need to push fluids and stay well hydrated to help thin out mucus. I have started her on a low dose of oral steroid of 20 mg daily, advised to not breastfeed for about 4 hours after taking medication. Additionally, I advised her that if her symptoms do not improve or develops low-grade fever, to start amoxicillin antibiotic. Lastly, I advised patient: "If you develop any fever >100.5, difficulty opening and closing your mouth, or difficulty breathing, please seek medical attention."  . Reviewed expectations re: course of current medical issues. . Discussed self-management of symptoms. . Outlined signs and symptoms indicating need for more acute intervention. . Patient verbalized understanding and all questions were answered. . See orders for this visit as documented in the electronic medical record. . Patient received an After-Visit Summary.  CMA or LPN served as scribe during this visit. History, Physical, and Plan performed by medical provider. Documentation and orders reviewed and attested to.  Jarold MottoSamantha Nakiesha Rumsey, PA-C

## 2016-11-17 NOTE — Patient Instructions (Addendum)
It was great meeting you today!  Start the oral steroid daily, avoid breastfeeding for approx 4 hours after taking this medication.  Push fluids, drink until your urine is mostly clear.   If you develop low-grade fever or do not feel improvement despite treatment, please start the oral antibiotic.  If you develop any fever >100.5, difficulty opening and closing your mouth, or difficulty breathing, please seek medical attention!

## 2016-11-17 NOTE — Progress Notes (Deleted)
Sarah Parks is a 36 y.o. female here for a {New prob or follow up:31724}.  Autumn Uvaldo RisingMcNeil, cma is acting as a Neurosurgeonscribe for [employer]antha Wor[place], [place].  History of Present Illness:   Chief Complaint  Patient presents with  . Establish Care  . Pain in Throat    Acute Concerns:   Chronic Issues:   Health Maintenance: Immunizations -- *** Colonoscopy -- *** Mammogram -- *** PAP -- *** Bone Density -- *** PSA -- *** Diet -- *** Caffeine intake -- *** Sleep habits -- *** Exercise -- *** Weight --    Mood -- ***  No flowsheet data found.  Other providers/specialists:    PMHx, SurgHx, SocialHx, Medications, and Allergies were reviewed in the Visit Navigator and updated as appropriate.  Current Medications:   Current Outpatient Prescriptions:  .  ibuprofen (ADVIL,MOTRIN) 600 MG tablet, Take 1 tablet (600 mg total) by mouth every 6 (six) hours., Disp: 30 tablet, Rfl: 0 .  oxyCODONE-acetaminophen (PERCOCET/ROXICET) 5-325 MG tablet, Take 1-2 tablets by mouth every 4 (four) hours as needed for severe pain., Disp: 20 tablet, Rfl: 0 .  pantoprazole (PROTONIX) 40 MG tablet, Take 40 mg by mouth daily., Disp: , Rfl:  .  Prenatal Vit-Fe Fumarate-FA (PRENATAL MULTIVITAMIN) TABS tablet, Take 1 tablet by mouth daily at 12 noon., Disp: , Rfl:    Review of Systems:   ROS  Vitals:   There were no vitals filed for this visit.   There is no height or weight on file to calculate BMI.  Physical Exam:   Physical Exam  Assessment and Plan:    There are no diagnoses linked to this encounter.  . Reviewed expectations re: course of current medical issues. . Discussed self-management of symptoms. . Outlined signs and symptoms indicating need for more acute intervention. . Patient verbalized understanding and all questions were answered. . See orders for this visit as documented in the electronic medical record. . Patient received an After-Visit Summary.  CMA or LPN served as scribe  during this visit. History, Physical, and Plan performed by medical provider. Documentation and orders reviewed and attested to.  Jarold MottoSamantha Worley, PA-C

## 2016-11-17 NOTE — Telephone Encounter (Signed)
FYI, appt today 

## 2016-11-17 NOTE — Telephone Encounter (Signed)
°  Patient called in wanting to establish and having symptoms of "bad pain in throat". Scheduled patient with Bufford ButtnerWorley at 10:30am for a NP appointment.

## 2017-01-27 ENCOUNTER — Ambulatory Visit (INDEPENDENT_AMBULATORY_CARE_PROVIDER_SITE_OTHER): Payer: 59 | Admitting: Family Medicine

## 2017-01-27 ENCOUNTER — Encounter: Payer: Self-pay | Admitting: Family Medicine

## 2017-01-27 DIAGNOSIS — M545 Low back pain, unspecified: Secondary | ICD-10-CM

## 2017-01-27 DIAGNOSIS — M549 Dorsalgia, unspecified: Secondary | ICD-10-CM | POA: Insufficient documentation

## 2017-01-27 MED ORDER — OXYCODONE-ACETAMINOPHEN 5-325 MG PO TABS: 1.0000 | ORAL_TABLET | ORAL | 0 refills | Status: DC | PRN

## 2017-01-27 MED ORDER — IBUPROFEN 800 MG PO TABS: 800.0000 mg | ORAL_TABLET | Freq: Three times a day (TID) | ORAL | 0 refills | Status: DC | PRN

## 2017-01-27 NOTE — Progress Notes (Signed)
   Sarah PeonDanielle G Parks is a 36 y.o. female here for an acute visit.  History of Present Illness:   Insurance claims handlerAmber Agner, CMA, acting as scribe for Dr. Earlene PlaterWallace.  HPI:  Patient states she began having back pain on Saturday.  No injury that she is aware of.  Pain is located in there mid to lower back.  Hurts to turn, walk, sit, stand.  States she has been trying to take it easy all week.  She has been using a heating pad and taking ibuprofen 800 mg but it is not helping her pain.  She has a small child at home but has not been able to lift her all week.  Pain does not radiate down her legs.  No numbness or tingling in her legs or feet.  PMHx, SurgHx, SocialHx, Medications, and Allergies were reviewed in the Visit Navigator and updated as appropriate.  Current Medications:   Current Outpatient Prescriptions:  .  [name] 0.35 MG tablet, , Disp: , Rfl:   No Known Allergies   Review of Systems:   Pertinent items are noted in the HPI. Otherwise, ROS is negative.  Vitals:   Vitals:   01/27/17 0913  BP: 118/72  Pulse: 97  Temp: 98.8 F (37.1 C)  TempSrc: Oral  SpO2: 96%  Weight: 176 lb 6.4 oz (80 kg)  Height: 5\' 8"  (1.727 m)     Body mass index is 26.82 kg/m.  Physical Exam:   Physical Exam  Constitutional: She appears well-nourished.  HENT:  Head: Normocephalic and atraumatic.  Eyes: Pupils are equal, round, and reactive to light. EOM are normal.  Neck: Normal range of motion. Neck supple.  Cardiovascular: Normal rate, regular rhythm, normal heart sounds and intact distal pulses.   Pulmonary/Chest: Effort normal.  Abdominal: Soft.  Musculoskeletal:  Tenderness to palpation along the bilateral lumbar paraspinal mm, decreased flexion due to pain, normal SLR bilaterally. Normal LE exam.   Skin: Skin is warm.  Psychiatric: She has a normal mood and affect. Her behavior is normal.  Nursing note and vitals reviewed.  Assessment and Plan:   Sarah HeckDanielle was seen today for acute  visit.  Diagnoses and all orders for this visit:  Acute bilateral low back pain without sciatica -     oxyCODONE-acetaminophen (PERCOCET/ROXICET) 5-325 MG tablet; Take 1 tablet by mouth every 4 (four) hours as needed for severe pain. -     ibuprofen (ADVIL,MOTRIN) 800 MG tablet; Take 1 tablet (800 mg total) by mouth every 8 (eight) hours as needed.   . Reviewed expectations re: course of current medical issues. . Discussed self-management of symptoms. . Outlined signs and symptoms indicating need for more acute intervention. . Patient verbalized understanding and all questions were answered. Sarah Parks. Health Maintenance issues including appropriate healthy diet, exercise, and smoking avoidance were discussed with patient. . See orders for this visit as documented in the electronic medical record. . Patient received an After Visit Summary.  CMA served as Neurosurgeonscribe during this visit. History, Physical, and Plan performed by medical provider. The above documentation has been reviewed and is accurate and complete. Helane RimaErica Jean Alejos, D.O.  Helane RimaErica Terrel Manalo, DO [place] [place] [facility] 01/28/2017

## 2017-02-01 ENCOUNTER — Ambulatory Visit (INDEPENDENT_AMBULATORY_CARE_PROVIDER_SITE_OTHER): Payer: Medicaid Other

## 2017-02-01 ENCOUNTER — Encounter: Payer: Self-pay | Admitting: Physician Assistant

## 2017-02-01 ENCOUNTER — Ambulatory Visit (INDEPENDENT_AMBULATORY_CARE_PROVIDER_SITE_OTHER): Payer: Medicaid Other | Admitting: Physician Assistant

## 2017-02-01 ENCOUNTER — Ambulatory Visit: Payer: Managed Care, Other (non HMO) | Admitting: Family Medicine

## 2017-02-01 VITALS — BP 104/70 | HR 89 | Temp 98.9°F | Ht 68.0 in | Wt 176.5 lb

## 2017-02-01 DIAGNOSIS — M545 Low back pain, unspecified: Secondary | ICD-10-CM

## 2017-02-01 LAB — POCT URINE PREGNANCY: Preg Test, Ur: NEGATIVE

## 2017-02-01 IMAGING — DX DG LUMBAR SPINE 2-3V
3 series · 3 of 3 positions shown · non-contrast
Comparison: None.

CLINICAL DATA: Lumbago for 2 weeks

EXAM:
LUMBAR SPINE - 2-3 VIEW

[lumbar spine ap]
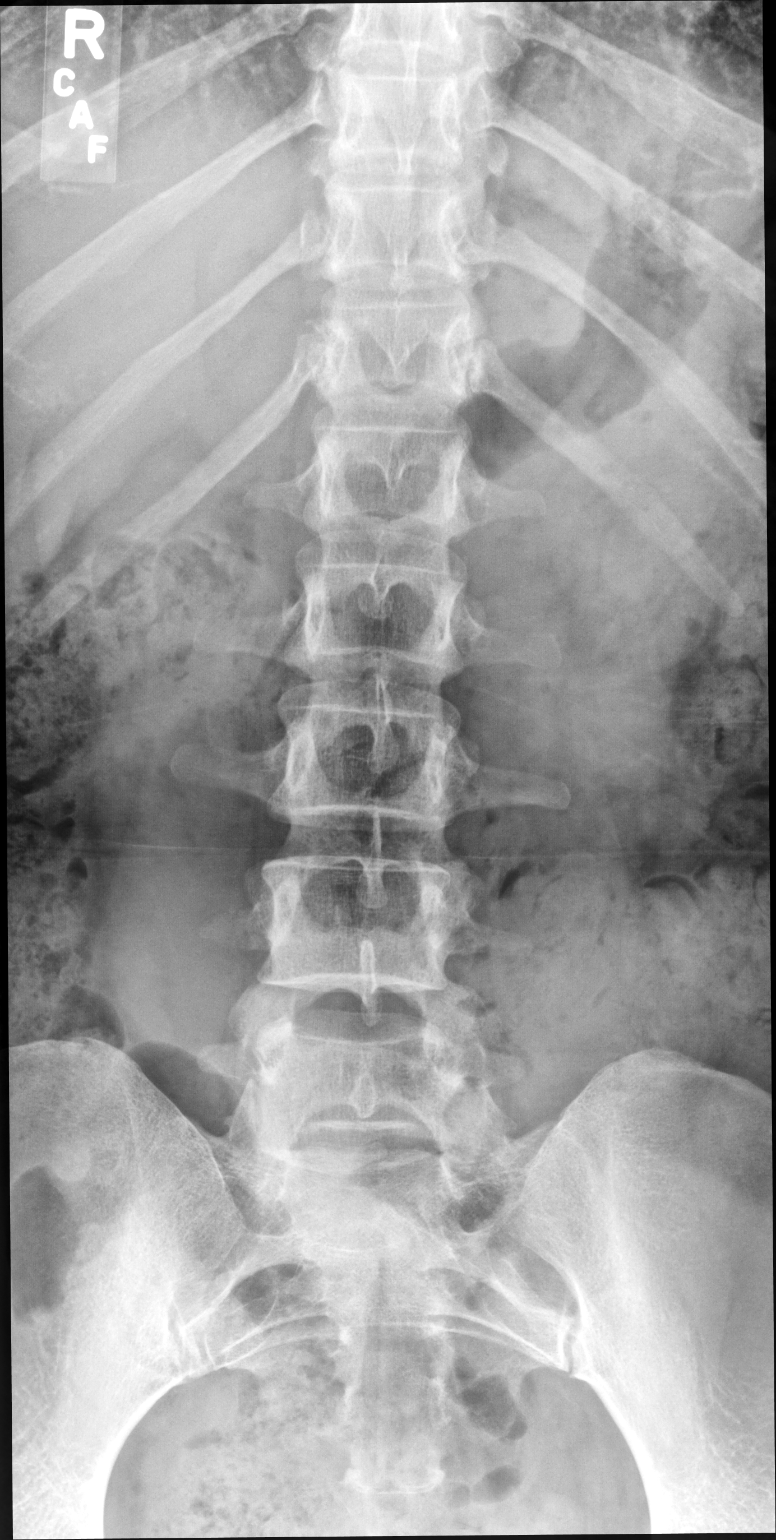

[lumbar spine lat (1 of 2)]
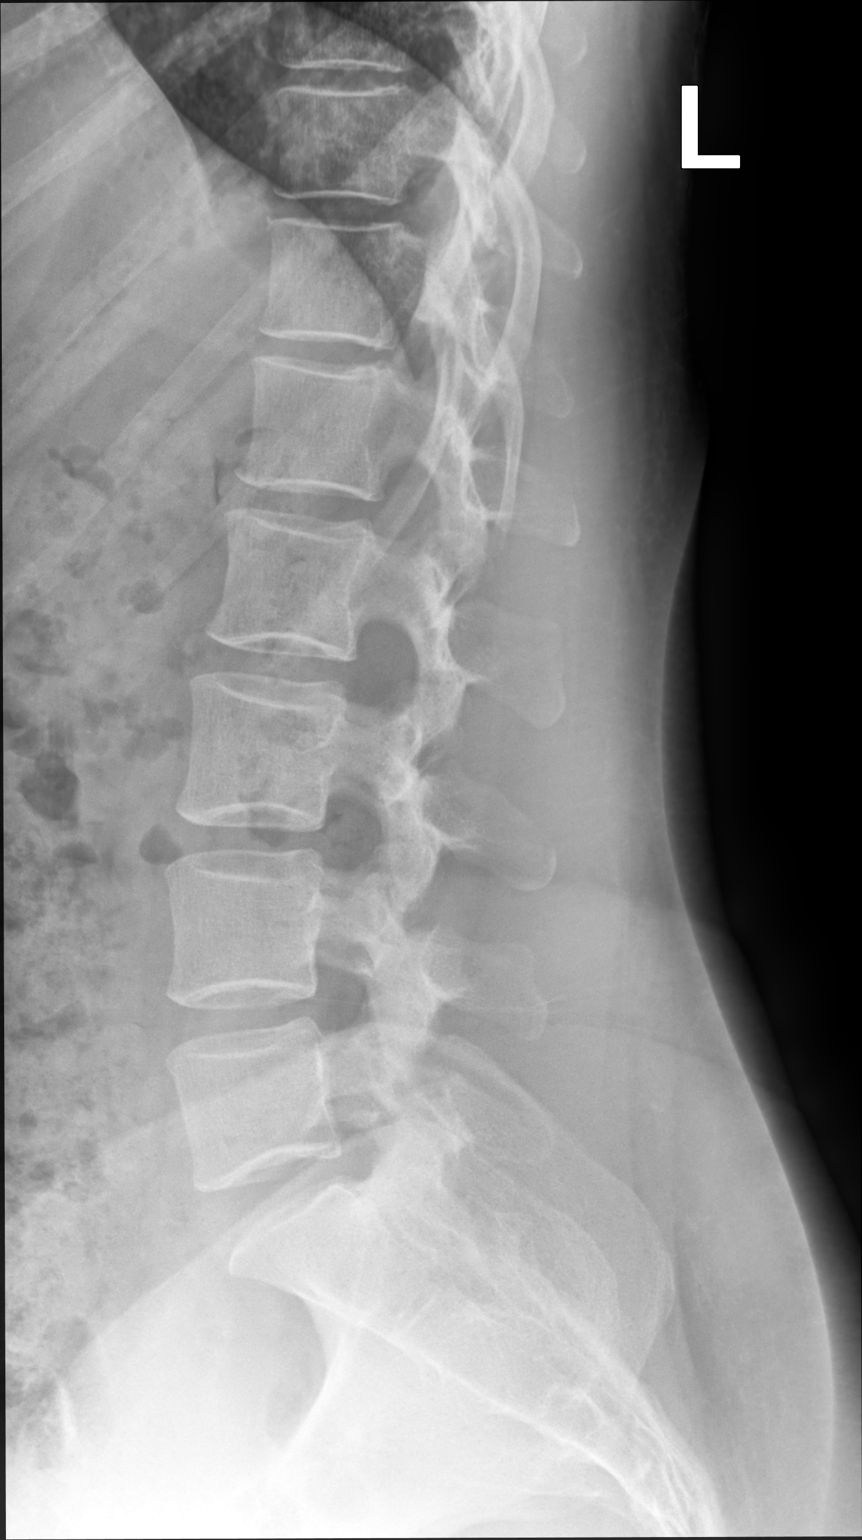

[lumbar spine lat (2 of 2)]
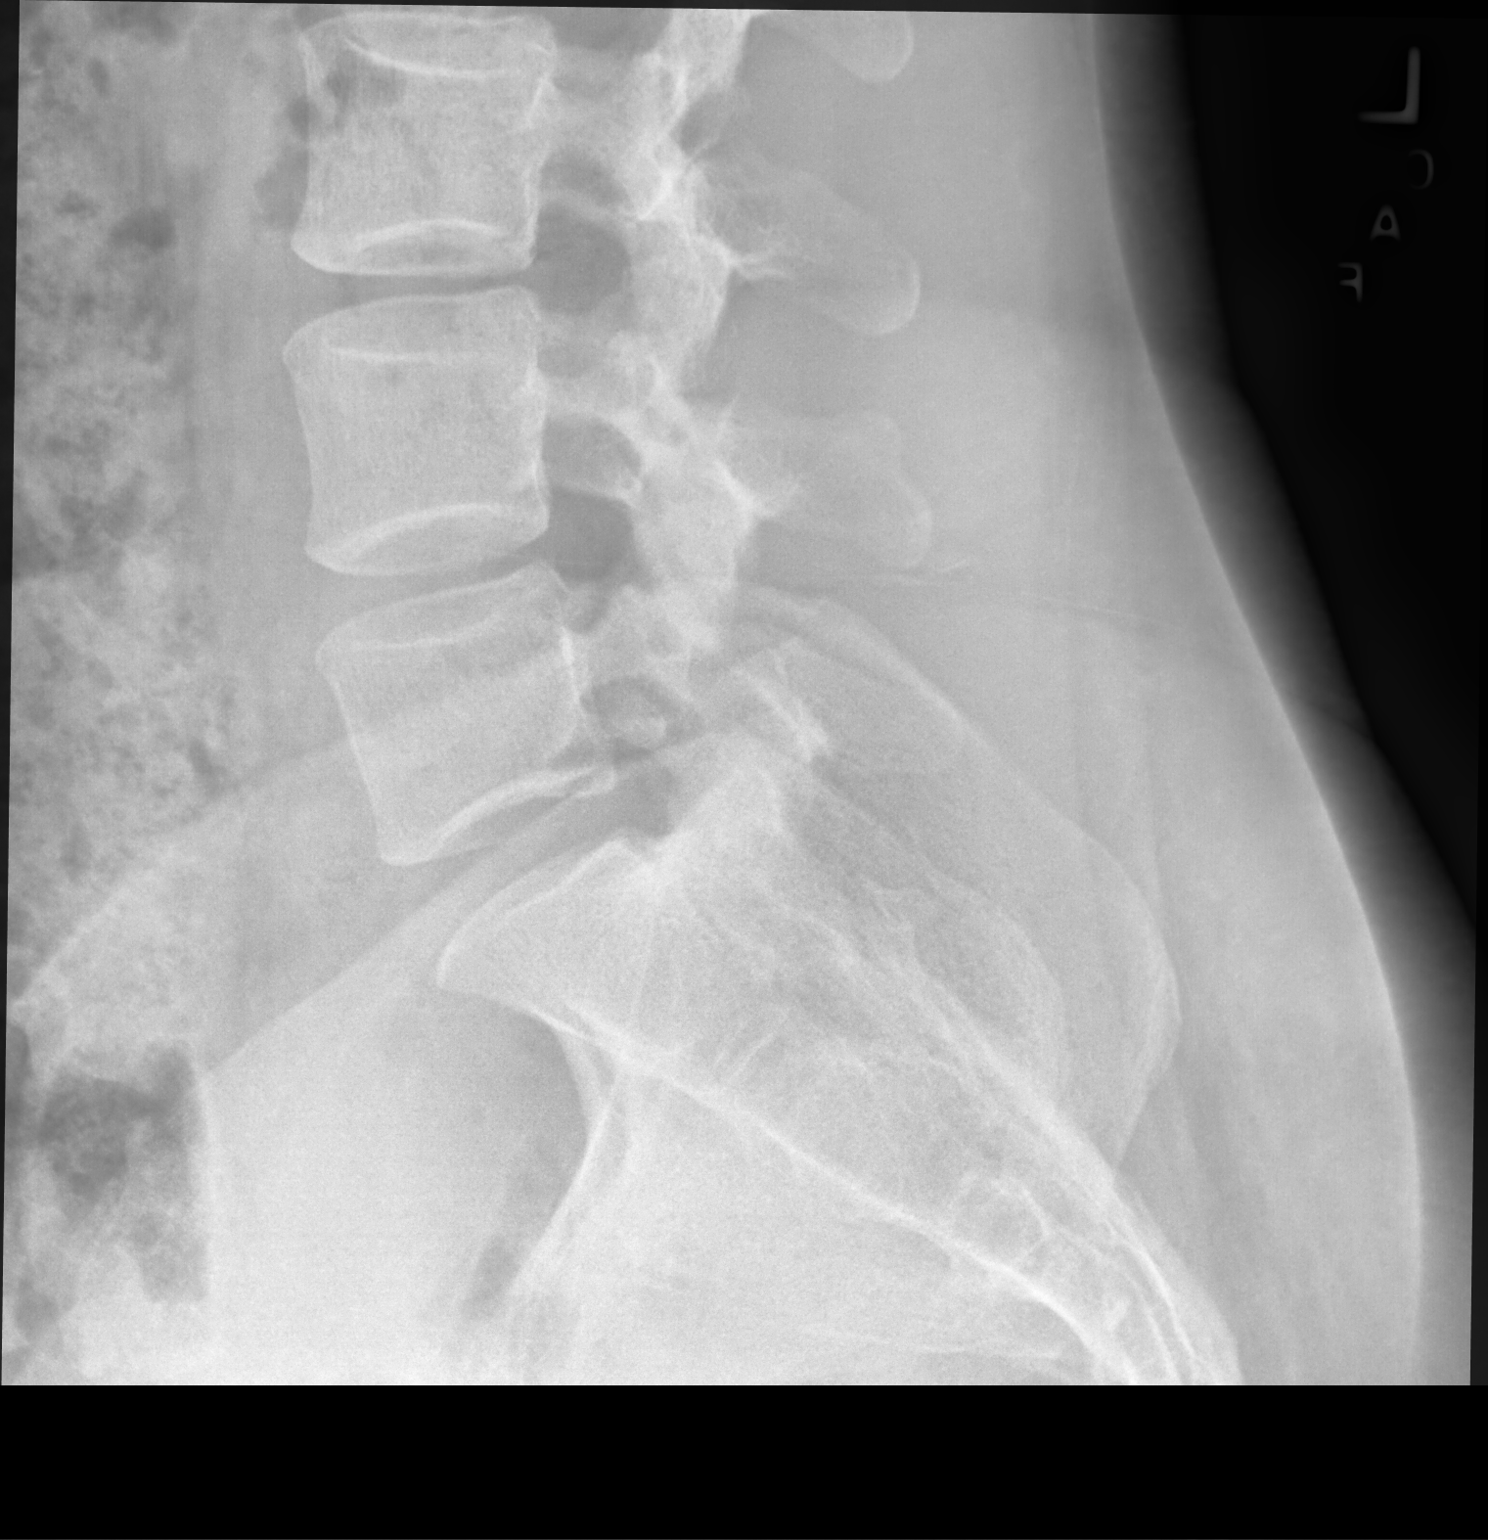

[3 of 3 positions shown; findings below may reference images not displayed]

FINDINGS: Frontal, lateral, and spot lumbosacral lateral images were obtained.
There are 5 non-rib-bearing lumbar type vertebral bodies. There is
no fracture or spondylolisthesis. The disc spaces appear normal. No
erosive change.
IMPRESSION: No fracture or spondylolisthesis.  No evident arthropathic change.

## 2017-02-01 MED ORDER — PREDNISONE 20 MG PO TABS: 20.0000 mg | ORAL_TABLET | Freq: Every day | ORAL | 0 refills | Status: DC

## 2017-02-01 NOTE — Progress Notes (Signed)
Sarah Parks is a 36 y.o. female here for back pain.   I acted as a Neurosurgeonscribe for Energy East CorporationSamantha Raife Lizer, PA-C Corky Mullonna Orphanos, LPN  History of Present Illness:   Chief Complaint  Patient presents with  . Back Pain    Back Pain  This is a recurrent problem. Episode onset: x 2 weeks. The problem occurs constantly. The problem has been waxing and waning since onset. The pain is present in the thoracic spine and lumbar spine. The quality of the pain is described as aching and shooting. Radiates to: into bilateral hips. The pain is at a severity of 6/10. The pain is moderate. The pain is the same all the time. The symptoms are aggravated by bending, position, twisting, standing and sitting. Stiffness is present all day. Pertinent negatives include no abdominal pain, bladder incontinence, bowel incontinence, chest pain, dysuria, fever, headaches, leg pain, numbness, tingling, weakness or weight loss. She has tried heat, NSAIDs and bed rest (Percocet makes her loopy) for the symptoms. The treatment provided moderate relief.   Still breastfeeding. Denies any urinary symptoms or bowel problems.  Patient's last menstrual period was 01/17/2017.   Past Medical History:  Diagnosis Date  . Cholestasis      Social History   Social History  . Marital status: Married    Spouse name: N/A  . Number of children: N/A  . Years of education: N/A   Occupational History  . Not on file.   Social History Main Topics  . Smoking status: Former Smoker    Quit date: 11/22/2007  . Smokeless tobacco: Never Used  . Alcohol use No  . Drug use: No  . Sexual activity: Yes    Birth control/ protection: None   Other Topics Concern  . Not on file   Social History Narrative  . No narrative on file    Past Surgical History:  Procedure Laterality Date  . BREAST BIOPSY     Fibroid tissue 2003  . DILATION AND EVACUATION N/A 05/14/2014   Procedure: DILATATION AND EVACUATION;  Surgeon: Lavina Hammanodd Meisinger, MD;   Location: [facility];  Service: Gynecology;  Laterality: N/A;  . INDUCED ABORTION    . WISDOM TOOTH EXTRACTION      Family History  Problem Relation Age of Onset  . Hypertension Brother   . Dementia Maternal Grandmother   . Cancer Maternal Grandfather   . Cancer Mother        breast    No Known Allergies  Current Medications:   Current Outpatient Prescriptions:  .  [name] 0.35 MG tablet, Take 1 tablet by mouth daily. , Disp: , Rfl:  .  ibuprofen (ADVIL,MOTRIN) 800 MG tablet, Take 1 tablet (800 mg total) by mouth every 8 (eight) hours as needed., Disp: 30 tablet, Rfl: 0 .  oxyCODONE-acetaminophen (PERCOCET/ROXICET) 5-325 MG tablet, Take 1 tablet by mouth every 4 (four) hours as needed for severe pain., Disp: 20 tablet, Rfl: 0 .  predniSONE (DELTASONE) 20 MG tablet, Take 1 tablet (20 mg total) by mouth daily with breakfast., Disp: 5 tablet, Rfl: 0   Review of Systems:   Review of Systems  Constitutional: Negative for fever and weight loss.  Cardiovascular: Negative for chest pain.  Gastrointestinal: Negative for abdominal pain and bowel incontinence.  Genitourinary: Negative for bladder incontinence and dysuria.  Musculoskeletal: Positive for back pain.  Neurological: Negative for tingling, weakness, numbness and headaches.    Vitals:   Vitals:   02/01/17 1112  BP: 104/70  Pulse: 89  Temp: 98.9 F (37.2 C)  TempSrc: Oral  SpO2: 97%  Weight: 176 lb 8 oz (80.1 kg)  Height: 5\' 8"  (1.727 m)     Body mass index is 26.84 kg/m.  Physical Exam:   Physical Exam  Constitutional: She appears well-developed. She is cooperative.  Non-toxic appearance. She does not have a sickly appearance. She does not appear ill. No distress.  Cardiovascular: Normal rate, regular rhythm, S1 normal, S2 normal, normal heart sounds and normal pulses.   No LE edema  Pulmonary/Chest: Effort normal and breath sounds normal.  Abdominal: There is no CVA tenderness.  Musculoskeletal:  Paraspinal  muscle tenderness to lumbar spine. No active spasms felt. Decreased ROM 2/2 pain with rotation, flexion/extension, and lateral bending.   Neurological: She is alert. She has normal strength. Coordination and gait normal. GCS eye subscore is 4. GCS verbal subscore is 5. GCS motor subscore is 6.  Skin: Skin is warm, dry and intact.  Psychiatric: She has a normal mood and affect. Her speech is normal and behavior is normal.  Nursing note and vitals reviewed.   Results for orders placed or performed in visit on 02/01/17  POCT urine pregnancy  Result Value Ref Range   Preg Test, Ur Negative Negative    Assessment and Plan:    Sarah Parks was seen today for back pain.  Diagnoses and all orders for this visit:  Acute bilateral low back pain without sciatica -     DG Lumbar Spine 2-3 Views -     POCT urine pregnancy  Other orders -     predniSONE (DELTASONE) 20 MG tablet; Take 1 tablet (20 mg total) by mouth daily with breakfast.   Urine pregnancy negative. The percocet is helping with the pain, however pain is now radiating to hips and she also is having "loopy" feelings from the medication. Given lack of improvement and new radiation of pain, will obtain imaging. Try short burst of low-dose prednisone -- advised that she not breastfeed for 4-6 hours after taking medication. [facility] if symptoms persist or worsen.  . Reviewed expectations re: course of current medical issues. . Discussed self-management of symptoms. . Outlined signs and symptoms indicating need for more acute intervention. . Patient verbalized understanding and all questions were answered. . See orders for this visit as documented in the electronic medical record. . Patient received an After-Visit Summary.  CMA or LPN served as scribe during this visit. History, Physical, and Plan performed by medical provider. Documentation and orders reviewed and attested to.  Jarold MottoSamantha Kaheem Halleck, PA-C

## 2017-02-01 NOTE — Patient Instructions (Signed)
Start 20 mg prednisone, take with food, avoid nursing for 4-6 hours after taking this medication.  If back pain worsens or does not improve, please make an appointment with Dr. [facility].   Back Pain, Adult Back pain is very common in adults.The cause of back pain is rarely dangerous and the pain often gets better over time.The cause of your back pain may not be known. Some common causes of back pain include:  Strain of the muscles or ligaments supporting the spine.  Wear and tear (degeneration) of the spinal disks.  Arthritis.  Direct injury to the back.  For many people, back pain may return. Since back pain is rarely dangerous, most people can learn to manage this condition on their own. Follow these instructions at home: Watch your back pain for any changes. The following actions may help to lessen any discomfort you are feeling:  Remain active. It is stressful on your back to sit or stand in one place for long periods of time. Do not sit, drive, or stand in one place for more than 30 minutes at a time. Take short walks on even surfaces as soon as you are able.Try to increase the length of time you walk each day.  Exercise regularly as directed by your health care provider. Exercise helps your back heal faster. It also helps avoid future injury by keeping your muscles strong and flexible.  Do not stay in bed.Resting more than 1-2 days can delay your recovery.  Pay attention to your body when you bend and lift. The most comfortable positions are those that put less stress on your recovering back. Always use proper lifting techniques, including: ? Bending your knees. ? Keeping the load close to your body. ? Avoiding twisting.  Find a comfortable position to sleep. Use a firm mattress and lie on your side with your knees slightly bent. If you lie on your back, put a pillow under your knees.  Avoid feeling anxious or stressed.Stress increases muscle tension  and can worsen back pain.It is important to recognize when you are anxious or stressed and learn ways to manage it, such as with exercise.  Take medicines only as directed by your health care provider. Over-the-counter medicines to reduce pain and inflammation are often the most helpful.Your health care provider may prescribe muscle relaxant drugs.These medicines help dull your pain so you can more quickly return to your normal activities and healthy exercise.  Apply ice to the injured area: ? Put ice in a plastic bag. ? Place a towel between your skin and the bag. ? Leave the ice on for 20 minutes, 2-3 times a day for the first 2-3 days. After that, ice and heat may be alternated to reduce pain and spasms.  Maintain a healthy weight. Excess weight puts extra stress on your back and makes it difficult to maintain good posture.  Contact a health care provider if:  You have pain that is not relieved with rest or medicine.  You have increasing pain going down into the legs or buttocks.  You have pain that does not improve in one week.  You have night pain.  You lose weight.  You have a fever or chills. Get help right away if:  You develop new bowel or bladder control problems.  You have unusual weakness or numbness in your arms or legs.  You develop nausea or vomiting.  You develop abdominal pain.  You feel faint. This information is not  intended to replace advice given to you by your health care provider. Make sure you discuss any questions you have with your health care provider. Document Released: 06/20/2005 Document Revised: 10/29/2015 Document Reviewed: 10/22/2013 Elsevier Interactive Patient Education  2017 [employer]

## 2017-04-26 ENCOUNTER — Encounter: Payer: Managed Care, Other (non HMO) | Admitting: Physician Assistant

## 2017-04-27 ENCOUNTER — Encounter: Payer: Self-pay | Admitting: Physician Assistant

## 2017-04-27 ENCOUNTER — Ambulatory Visit (INDEPENDENT_AMBULATORY_CARE_PROVIDER_SITE_OTHER): Payer: Managed Care, Other (non HMO) | Admitting: Physician Assistant

## 2017-04-27 VITALS — BP 116/70 | HR 98 | Temp 98.4°F | Ht 68.0 in | Wt 176.0 lb

## 2017-04-27 DIAGNOSIS — N644 Mastodynia: Secondary | ICD-10-CM | POA: Diagnosis not present

## 2017-04-27 DIAGNOSIS — R5383 Other fatigue: Secondary | ICD-10-CM | POA: Diagnosis not present

## 2017-04-27 DIAGNOSIS — E538 Deficiency of other specified B group vitamins: Secondary | ICD-10-CM | POA: Insufficient documentation

## 2017-04-27 DIAGNOSIS — K5900 Constipation, unspecified: Secondary | ICD-10-CM

## 2017-04-27 DIAGNOSIS — Z13220 Encounter for screening for lipoid disorders: Secondary | ICD-10-CM

## 2017-04-27 DIAGNOSIS — Z0001 Encounter for general adult medical examination with abnormal findings: Secondary | ICD-10-CM | POA: Diagnosis not present

## 2017-04-27 DIAGNOSIS — Z136 Encounter for screening for cardiovascular disorders: Secondary | ICD-10-CM | POA: Diagnosis not present

## 2017-04-27 DIAGNOSIS — R6889 Other general symptoms and signs: Secondary | ICD-10-CM

## 2017-04-27 LAB — CBC WITH DIFFERENTIAL/PLATELET
BASOS ABS: 0 10*3/uL (ref 0.0–0.1)
Basophils Relative: 0.5 % (ref 0.0–3.0)
EOS ABS: 0.1 10*3/uL (ref 0.0–0.7)
Eosinophils Relative: 1.4 % (ref 0.0–5.0)
HEMATOCRIT: 44 % (ref 36.0–46.0)
HEMOGLOBIN: 14.8 g/dL (ref 12.0–15.0)
Lymphocytes Relative: 31.5 % (ref 12.0–46.0)
Lymphs Abs: 2.3 10*3/uL (ref 0.7–4.0)
MCHC: 33.7 g/dL (ref 30.0–36.0)
MCV: 99.8 fl (ref 78.0–100.0)
MONOS PCT: 8.4 % (ref 3.0–12.0)
Monocytes Absolute: 0.6 10*3/uL (ref 0.1–1.0)
Neutro Abs: 4.2 10*3/uL (ref 1.4–7.7)
Neutrophils Relative %: 58.2 % (ref 43.0–77.0)
Platelets: 305 10*3/uL (ref 150.0–400.0)
RBC: 4.41 Mil/uL (ref 3.87–5.11)
RDW: 13.7 % (ref 11.5–15.5)
WBC: 7.2 10*3/uL (ref 4.0–10.5)

## 2017-04-27 LAB — LIPID PANEL
Cholesterol: 193 mg/dL (ref 0–200)
HDL: 55.6 mg/dL
LDL Cholesterol: 126 mg/dL — ABNORMAL HIGH (ref 0–99)
NonHDL: 137.56
Total CHOL/HDL Ratio: 3
Triglycerides: 57 mg/dL (ref 0.0–149.0)
VLDL: 11.4 mg/dL (ref 0.0–40.0)

## 2017-04-27 LAB — COMPREHENSIVE METABOLIC PANEL
ALBUMIN: 4.4 g/dL (ref 3.5–5.2)
ALK PHOS: 73 U/L (ref 39–117)
ALT: 36 U/L — AB (ref 0–35)
AST: 21 U/L (ref 0–37)
BILIRUBIN TOTAL: 1 mg/dL (ref 0.2–1.2)
BUN: 11 mg/dL (ref 6–23)
CALCIUM: 9.4 mg/dL (ref 8.4–10.5)
CO2: 26 mEq/L (ref 19–32)
Chloride: 103 mEq/L (ref 96–112)
Creatinine, Ser: 0.88 mg/dL (ref 0.40–1.20)
GFR: 94.09 mL/min (ref >60.00)
Glucose, Bld: 88 mg/dL (ref 70–99)
Potassium: 4 mEq/L (ref 3.5–5.1)
Sodium: 136 mEq/L (ref 135–145)
TOTAL PROTEIN: 7.5 g/dL (ref 6.0–8.3)

## 2017-04-27 LAB — POCT URINE PREGNANCY: Preg Test, Ur: NEGATIVE

## 2017-04-27 LAB — TSH: TSH: 1.1 u[IU]/mL (ref 0.35–4.50)

## 2017-04-27 LAB — VITAMIN B12: VITAMIN B 12: 275 pg/mL (ref 211–911)

## 2017-04-27 NOTE — Patient Instructions (Signed)
It was great meeting you!  Please start Miralax and Colace to help with your constipation. These are both available over the counter. Generic is fine.  We will call you with your lab results.  Please get your diagnostic mammogram completed.  Health Maintenance, Female Adopting a healthy lifestyle and getting preventive care can go a long way to promote health and wellness. Talk with your health care provider about what schedule of regular examinations is right for you. This is a good chance for you to check in with your provider about disease prevention and staying healthy. In between checkups, there are plenty of things you can do on your own. Experts have done a lot of research about which lifestyle changes and preventive measures are most likely to keep you healthy. Ask your health care provider for more information. Weight and diet Eat a healthy diet  Be sure to include plenty of vegetables, fruits, low-fat dairy products, and lean protein.  Do not eat a lot of foods high in solid fats, added sugars, or salt.  Get regular exercise. This is one of the most important things you can do for your health. ? Most adults should exercise for at least 150 minutes each week. The exercise should increase your heart rate and make you sweat (moderate-intensity exercise). ? Most adults should also do strengthening exercises at least twice a week. This is in addition to the moderate-intensity exercise.  Maintain a healthy weight  Body mass index (BMI) is a measurement that can be used to identify possible weight problems. It estimates body fat based on height and weight. Your health care provider can help determine your BMI and help you achieve or maintain a healthy weight.  For females 20 years of age and older: ? A BMI below 18.5 is considered underweight. ? A BMI of 18.5 to 24.9 is normal. ? A BMI of 25 to 29.9 is considered overweight. ? A BMI of 30 and above is considered obese.  Watch  levels of cholesterol and blood lipids  You should start having your blood tested for lipids and cholesterol at 35 years of age, then have this test every 5 years.  You may need to have your cholesterol levels checked more often if: ? Your lipid or cholesterol levels are high. ? You are older than 35 years of age. ? You are at high risk for heart disease.  Cancer screening Lung Cancer  Lung cancer screening is recommended for adults 80-10 years old who are at high risk for lung cancer because of a history of smoking.  A yearly low-dose CT scan of the lungs is recommended for people who: ? Currently smoke. ? Have quit within the past 15 years. ? Have at least a 30-pack-year history of smoking. A pack year is smoking an average of one pack of cigarettes a day for 1 year.  Yearly screening should continue until it has been 15 years since you quit.  Yearly screening should stop if you develop a health problem that would prevent you from having lung cancer treatment.  Breast Cancer  Practice breast self-awareness. This means understanding how your breasts normally appear and feel.  It also means doing regular breast self-exams. Let your health care provider know about any changes, no matter how small.  If you are in your 20s or 30s, you should have a clinical breast exam (CBE) by a health care provider every 1-3 years as part of a regular health exam.  If you are  73 or older, have a CBE every year. Also consider having a breast X-ray (mammogram) every year.  If you have a family history of breast cancer, talk to your health care provider about genetic screening.  If you are at high risk for breast cancer, talk to your health care provider about having an MRI and a mammogram every year.  Breast cancer gene (BRCA) assessment is recommended for women who have family members with BRCA-related cancers. BRCA-related cancers include: ? Breast. ? Ovarian. ? Tubal. ? Peritoneal  cancers.  Results of the assessment will determine the need for genetic counseling and BRCA1 and BRCA2 testing.  Cervical Cancer Your health care provider may recommend that you be screened regularly for cancer of the pelvic organs (ovaries, uterus, and vagina). This screening involves a pelvic examination, including checking for microscopic changes to the surface of your cervix (Pap test). You may be encouraged to have this screening done every 3 years, beginning at age 14.  For women ages 34-65, health care providers may recommend pelvic exams and Pap testing every 3 years, or they may recommend the Pap and pelvic exam, combined with testing for human papilloma virus (HPV), every 5 years. Some types of HPV increase your risk of cervical cancer. Testing for HPV may also be done on women of any age with unclear Pap test results.  Other health care providers may not recommend any screening for nonpregnant women who are considered low risk for pelvic cancer and who do not have symptoms. Ask your health care provider if a screening pelvic exam is right for you.  If you have had past treatment for cervical cancer or a condition that could lead to cancer, you need Pap tests and screening for cancer for at least 20 years after your treatment. If Pap tests have been discontinued, your risk factors (such as having a new sexual partner) need to be reassessed to determine if screening should resume. Some women have medical problems that increase the chance of getting cervical cancer. In these cases, your health care provider may recommend more frequent screening and Pap tests.  Colorectal Cancer  This type of cancer can be detected and often prevented.  Routine colorectal cancer screening usually begins at 35 years of age and continues through 35 years of age.  Your health care provider may recommend screening at an earlier age if you have risk factors for colon cancer.  Your health care provider may also  recommend using home test kits to check for hidden blood in the stool.  A small camera at the end of a tube can be used to examine your colon directly (sigmoidoscopy or colonoscopy). This is done to check for the earliest forms of colorectal cancer.  Routine screening usually begins at age 8.  Direct examination of the colon should be repeated every 5-10 years through 35 years of age. However, you may need to be screened more often if early forms of precancerous polyps or small growths are found.  Skin Cancer  Check your skin from head to toe regularly.  Tell your health care provider about any new moles or changes in moles, especially if there is a change in a mole's shape or color.  Also tell your health care provider if you have a mole that is larger than the size of a pencil eraser.  Always use sunscreen. Apply sunscreen liberally and repeatedly throughout the day.  Protect yourself by wearing long sleeves, pants, a wide-brimmed hat, and sunglasses whenever you  are outside.  Heart disease, diabetes, and high blood pressure  High blood pressure causes heart disease and increases the risk of stroke. High blood pressure is more likely to develop in: ? People who have blood pressure in the high end of the normal range (130-139/85-89 mm Hg). ? People who are overweight or obese. ? People who are African American.  If you are 19-20 years of age, have your blood pressure checked every 3-5 years. If you are 49 years of age or older, have your blood pressure checked every year. You should have your blood pressure measured twice-once when you are at a hospital or clinic, and once when you are not at a hospital or clinic. Record the average of the two measurements. To check your blood pressure when you are not at a hospital or clinic, you can use: ? An automated blood pressure machine at a pharmacy. ? A home blood pressure monitor.  If you are between 73 years and 61 years old, ask your  health care provider if you should take aspirin to prevent strokes.  Have regular diabetes screenings. This involves taking a blood sample to check your fasting blood sugar level. ? If you are at a normal weight and have a low risk for diabetes, have this test once every three years after 35 years of age. ? If you are overweight and have a high risk for diabetes, consider being tested at a younger age or more often. Preventing infection Hepatitis B  If you have a higher risk for hepatitis B, you should be screened for this virus. You are considered at high risk for hepatitis B if: ? You were born in a country where hepatitis B is common. Ask your health care provider which countries are considered high risk. ? Your parents were born in a high-risk country, and you have not been immunized against hepatitis B (hepatitis B vaccine). ? You have HIV or AIDS. ? You use needles to inject street drugs. ? You live with someone who has hepatitis B. ? You have had sex with someone who has hepatitis B. ? You get hemodialysis treatment. ? You take certain medicines for conditions, including cancer, organ transplantation, and autoimmune conditions.  Hepatitis C  Blood testing is recommended for: ? Everyone born from [birth year] through 1964. ? Anyone with known risk factors for hepatitis C.  Sexually transmitted infections (STIs)  You should be screened for sexually transmitted infections (STIs) including gonorrhea and chlamydia if: ? You are sexually active and are younger than 35 years of age. ? You are older than 35 years of age and your health care provider tells you that you are at risk for this type of infection. ? Your sexual activity has changed since you were last screened and you are at an increased risk for chlamydia or gonorrhea. Ask your health care provider if you are at risk.  If you do not have HIV, but are at risk, it may be recommended that you take a prescription medicine daily to  prevent HIV infection. This is called pre-exposure prophylaxis (PrEP). You are considered at risk if: ? You are sexually active and do not regularly use condoms or know the HIV status of your partner(s). ? You take drugs by injection. ? You are sexually active with a partner who has HIV.  Talk with your health care provider about whether you are at high risk of being infected with HIV. If you choose to begin PrEP, you should first be  tested for HIV. You should then be tested every 3 months for as long as you are taking PrEP. Pregnancy  If you are premenopausal and you may become pregnant, ask your health care provider about preconception counseling.  If you may become pregnant, take 400 to 800 micrograms (mcg) of folic acid every day.  If you want to prevent pregnancy, talk to your health care provider about birth control (contraception). Osteoporosis and menopause  Osteoporosis is a disease in which the bones lose minerals and strength with aging. This can result in serious bone fractures. Your risk for osteoporosis can be identified using a bone density scan.  If you are 32 years of age or older, or if you are at risk for osteoporosis and fractures, ask your health care provider if you should be screened.  Ask your health care provider whether you should take a calcium or vitamin D supplement to lower your risk for osteoporosis.  Menopause may have certain physical symptoms and risks.  Hormone replacement therapy may reduce some of these symptoms and risks. Talk to your health care provider about whether hormone replacement therapy is right for you. Follow these instructions at home:  Schedule regular health, dental, and eye exams.  Stay current with your immunizations.  Do not use any tobacco products including cigarettes, chewing tobacco, or electronic cigarettes.  If you are pregnant, do not drink alcohol.  If you are breastfeeding, limit how much and how often you drink  alcohol.  Limit alcohol intake to no more than 1 drink per day for nonpregnant women. One drink equals 12 ounces of beer, 5 ounces of wine, or 1 ounces of hard liquor.  Do not use street drugs.  Do not share needles.  Ask your health care provider for help if you need support or information about quitting drugs.  Tell your health care provider if you often feel depressed.  Tell your health care provider if you have ever been abused or do not feel safe at home. This information is not intended to replace advice given to you by your health care provider. Make sure you discuss any questions you have with your health care provider. Document Released: 01/03/2011 Document Revised: 11/26/2015 Document Reviewed: 03/24/2015 Elsevier Interactive Patient Education  [employer].

## 2017-04-27 NOTE — Progress Notes (Signed)
Subjective:    Sarah PeonDanielle G Parks is a 36 y.o. female and is here for a comprehensive physical exam.  HPI  Health Maintenance Due  Topic Date Due  . INFLUENZA VACCINE  02/01/2017    Acute Concerns: L breast pain -- stopped breastfeeding 2 months ago, no abnormal breast pain, discharge, or lumps that she is aware of; mother has a positive history of breast cancer dx around age 35; patient does report a personal mammogram a long time ago and also had a lumpectomy of L breast for fibroadenoma; denies hx of mastitis with recent breastfeeding, no fever  Constipation -- has a chronic history of this, but it is worse now than usual. Has tried: [employer] [facility] [employer] [employer], Teas, Raisin Bran. No blood in stool. LBM this morning -- small and hard. No hemorrhoids or abdominal pain. Drinks water throughout the day. Hasn't tried any OTC medications. Cold intolerance -- has been noticing that she is always cold and everyone around her is hot. Denies night sweats, but has had some trouble losing weight since cessation of breastfeeding.  Wt Readings from Last 3 Encounters:  04/27/17 176 lb (79.8 kg)  02/01/17 176 lb 8 oz (80.1 kg)  01/27/17 176 lb 6.4 oz (80 kg)    Health Maintenance: Immunizations -- declined flu shot Colonoscopy -- none, no family hx Mammogram -- none recent;  patient does report a personal mammogram a long time ago and also had a lumpectomy of L breast for fibroadenoma PAP -- last PAP was Jan/Feb this year and was normal Bone Density -- none, no family hx of osteoporosis Diet -- "trying to do better", likes to snack on carbohydrates Caffeine intake -- 4 cups of coffee q am Sleep habits -- better now that she is not nursing, still not getting full night of sleep because of 36 year old Exercise -- scheduled exercise 2 times a week, active with daughter Weight -- Weight: 176 lb (79.8 kg)  --> goal to 165 lb, was losing weight with breastfeeding Mood -- has some irritability with cycle, needs to  work "me time" Last period -- Patient's last menstrual period was 04/07/2017. Period characteristics -- 5 days long, heavier  Birth control -- Camilla birth control is the only one that works for her  [facility]2017  Decreased Interest 0  Down, Depressed, Hopeless 0  PHQ - 2 Score 0   Other providers/specialists: Ob-Gyn  Dentist No eye doctor  PMHx, SurgHx, SocialHx, Medications, and Allergies were reviewed in the Visit Navigator and updated as appropriate.   Past Medical History:  Diagnosis Date  . Anemia   . Cholestasis      Past Surgical History:  Procedure Laterality Date  . BREAST BIOPSY     Fibroid tissue 2003  . DILATION AND EVACUATION N/A 05/14/2014   Procedure: DILATATION AND EVACUATION;  Surgeon: Lavina Hammanodd Meisinger, MD;  Location: [facility];  Service: Gynecology;  Laterality: N/A;  . INDUCED ABORTION    . WISDOM TOOTH EXTRACTION       Family History  Problem Relation Age of Onset  . Hypertension Brother   . Dementia Maternal Grandmother   . Cancer Maternal Grandfather   . Cancer Mother        breast  . Colon cancer Neg Hx     Social History  Substance Use Topics  . Smoking status: Former Smoker    Quit date: 11/22/2007  . Smokeless tobacco: Never Used  . Alcohol use No    Review  of Systems:   Review of Systems  Constitutional: Positive for malaise/fatigue. Negative for chills, fever and weight loss.  HENT: Negative for hearing loss, sinus pain and sore throat.   Respiratory: Negative for cough and hemoptysis.   Cardiovascular: Negative for chest pain, palpitations, leg swelling and PND.  Gastrointestinal: Positive for constipation. Negative for abdominal pain, diarrhea, heartburn, nausea and vomiting.  Genitourinary: Negative for dysuria, frequency and urgency.  Musculoskeletal: Negative for back pain, myalgias and neck pain.  Skin: Negative for itching and rash.  Neurological: Negative for dizziness, tingling, focal weakness and  headaches. Seizures: .swwell.  Endo/Heme/Allergies: Negative for polydipsia.  Psychiatric/Behavioral: Negative for depression. The patient is not nervous/anxious.     Objective:   BP 116/70   Pulse 98   Temp 98.4 F (36.9 C) (Oral)   Ht 5\' 8"  (1.727 m)   Wt 176 lb (79.8 kg)   LMP 04/07/2017   SpO2 98%   BMI 26.76 kg/m   General Appearance:    Alert, cooperative, no distress, appears stated age  Head:    Normocephalic, without obvious abnormality, atraumatic  Eyes:    PERRL, conjunctiva/corneas clear, EOM's intact, fundi    benign, both eyes  Ears:    Normal TM's and external ear canals, both ears  Nose:   Nares normal, septum midline, mucosa normal, no drainage    or sinus tenderness  Throat:   Lips, mucosa, and tongue normal; teeth and gums normal  Neck:   Supple, symmetrical, trachea midline, no adenopathy;    thyroid:  no enlargement/tenderness/nodules; no carotid   bruit or JVD  Back:     Symmetric, no curvature, ROM normal, no CVA tenderness  Lungs:     Clear to auscultation bilaterally, respirations unlabored  Chest Wall:    No tenderness or deformity   Heart:    Regular rate and rhythm, S1 and S2 normal, no murmur, rub   or gallop  Breast Exam:    No tenderness, masses, or nipple abnormality; does have small scar at approximately 2 o'clock of L breast  Abdomen:     Soft, non-tender, bowel sounds active all four quadrants,    no masses, no organomegaly  Genitalia:    Deferred  Rectal:    Deferred  Extremities:   Extremities normal, atraumatic, no cyanosis or edema  Pulses:   2+ and symmetric all extremities  Skin:   Skin color, texture, turgor normal, no rashes or lesions  Lymph nodes:   Cervical, supraclavicular, and axillary nodes normal  Neurologic:   CNII-XII intact, normal strength, sensation and reflexes    throughout   Results for orders placed or performed in visit on 04/27/17  POCT urine pregnancy  Result Value Ref Range   Preg Test, Ur Negative Negative       Assessment/Plan:   Sarah Parks was seen today for annual exam and breast pain.  Diagnoses and all orders for this visit:  Encounter for general adult medical examination with abnormal findings Today patient counseled on age appropriate routine health concerns for screening and prevention, each reviewed and up to date or declined. Immunizations reviewed and up to date or declined. Labs ordered and reviewed. Risk factors for depression reviewed and negative. Hearing function and visual acuity are intact. ADLs screened and addressed as needed. Functional ability and level of safety reviewed and appropriate. Education, counseling and referrals performed based on assessed risks today. Patient provided with a copy of personalized plan for preventive services. -     CBC  with Differential/Platelet -     Comprehensive metabolic panel  Breast pain Breast exam without obvious findings however, given strong family history, will order diagnostic mammogram. Mammogram has been scheduled tentatively for next Wednesday. -     POCT urine pregnancy -     MM DIAG BREAST TOMO BILATERAL; Future -     US BREAST LTD UNI LEFT INC AXILLA; Future -     US BREAST LTD UNI RIGHT INC AXILLA; Future  Constipation, unspecified constipation type Will check routine labs. Discussed hydration and increase in physical activity. Discussed use of either Miralax or Colace, patient will consider, however admits that she does not like use of medications. -     TSH -     Vitamin B12  Cold intolerance Will check routine labs, as well as TSH and Vitamin B12. Follow-up if symptoms worsen or persist. -     TSH -     Vitamin B12  Other fatigue Urine pregnancy negative. Likely multifactorial given diet and parental responsibilities, however will check routine labs and TSH and Vit B12. She does have hx of anemia -- CBC pending. We did discuss need for more well-balanced snacks. -     TSH -     Vitamin B12 -     POCT urine  pregnancy  Lipid screening for cardiovascular disease Lipid panel today.  Well Adult Exam: Labs ordered: Yes. Patient counseling was done. See below for items discussed. Discussed the patient's BMI. The BMI BMI is in the acceptable range Follow up in one year.  Patient Counseling:   [x]     Nutrition: Stressed importance of moderation in sodium/caffeine intake, saturated fat and cholesterol, caloric balance, sufficient intake of fresh fruits, vegetables, fiber, calcium, iron, and 1 mg of folate supplement per day (for females capable of pregnancy).   [x]      Stressed the importance of regular exercise.    []     Substance Abuse: Discussed cessation/primary prevention of tobacco, alcohol, or other drug use; driving or other dangerous activities under the influence; availability of treatment for abuse.    [x]      Injury prevention: Discussed safety belts, safety helmets, smoke detector, smoking near bedding or upholstery.    [x]      Sexuality: Discussed sexually transmitted diseases, partner selection, use of condoms, avoidance of unintended pregnancy  and contraceptive alternatives.    [x]     Dental health: Discussed importance of regular tooth brushing, flossing, and dental visits.   [x]      Health maintenance and immunizations reviewed. Please refer to Health maintenance section.    [name], PA-C [facility]

## 2017-04-28 ENCOUNTER — Encounter: Payer: Self-pay | Admitting: Physician Assistant

## 2017-04-28 ENCOUNTER — Ambulatory Visit (INDEPENDENT_AMBULATORY_CARE_PROVIDER_SITE_OTHER): Payer: Managed Care, Other (non HMO) | Admitting: Physician Assistant

## 2017-04-28 DIAGNOSIS — E538 Deficiency of other specified B group vitamins: Secondary | ICD-10-CM

## 2017-04-28 MED ORDER — CYANOCOBALAMIN 1000 MCG/ML IJ SOLN
1000.0000 ug | Freq: Once | INTRAMUSCULAR | Status: AC
  Administered 2017-04-28: 1000 ug via INTRAMUSCULAR

## 2017-04-28 NOTE — Progress Notes (Signed)
I agree with the diagnosis and need for Vit B12 injection that was administered at today's visit.  Jarold MottoSamantha Alizia Greif PA-C 04/28/17

## 2017-04-30 ENCOUNTER — Encounter: Payer: Self-pay | Admitting: Physician Assistant

## 2017-05-03 ENCOUNTER — Ambulatory Visit
Admission: RE | Admit: 2017-05-03 | Discharge: 2017-05-03 | Disposition: A | Discharge status: Home / Self Care | Payer: Managed Care, Other (non HMO) | Source: Ambulatory Visit | Attending: Physician Assistant | Admitting: Physician Assistant

## 2017-05-03 ENCOUNTER — Ambulatory Visit: Payer: Managed Care, Other (non HMO)

## 2017-05-03 DIAGNOSIS — N644 Mastodynia: Secondary | ICD-10-CM

## 2017-05-03 IMAGING — MG 2D DIGITAL DIAGNOSTIC BILATERAL MAMMOGRAM WITH CAD AND ADJUNCT T
9 of 12 series · 9 of 28 positions shown · non-contrast
Comparison: Previous exam(s).

CLINICAL DATA: Nonfocal stinging pain in the left breast for the
past 2 months after finishing breastfeeding. The patient's mother
was diagnosed with breast cancer at approximately age 40.

EXAM:
2D DIGITAL DIAGNOSTIC BILATERAL MAMMOGRAM WITH CAD AND ADJUNCT TOMO

[L MLO]
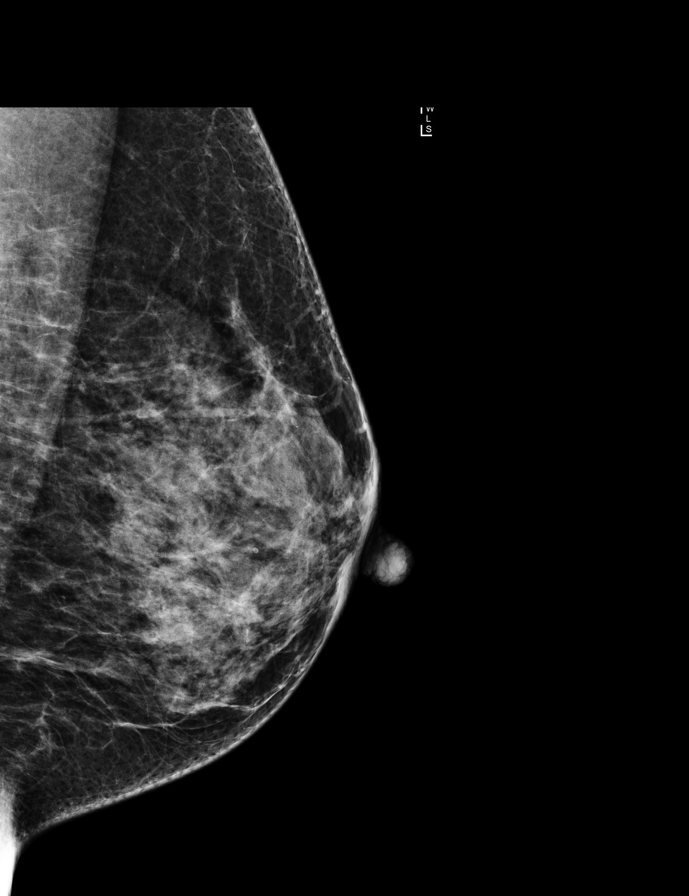

[L CC]
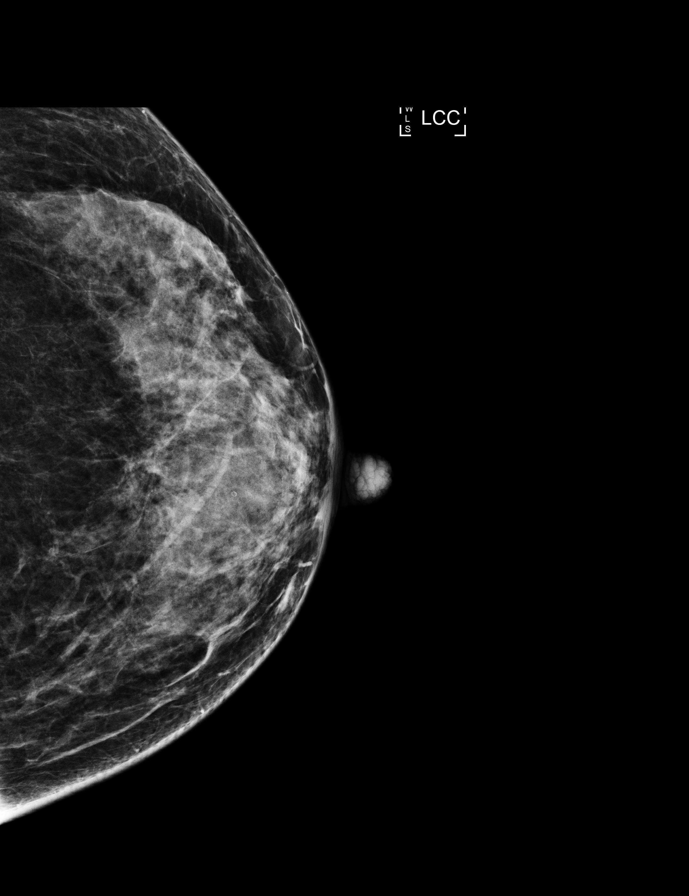

[L CC synth-2D]
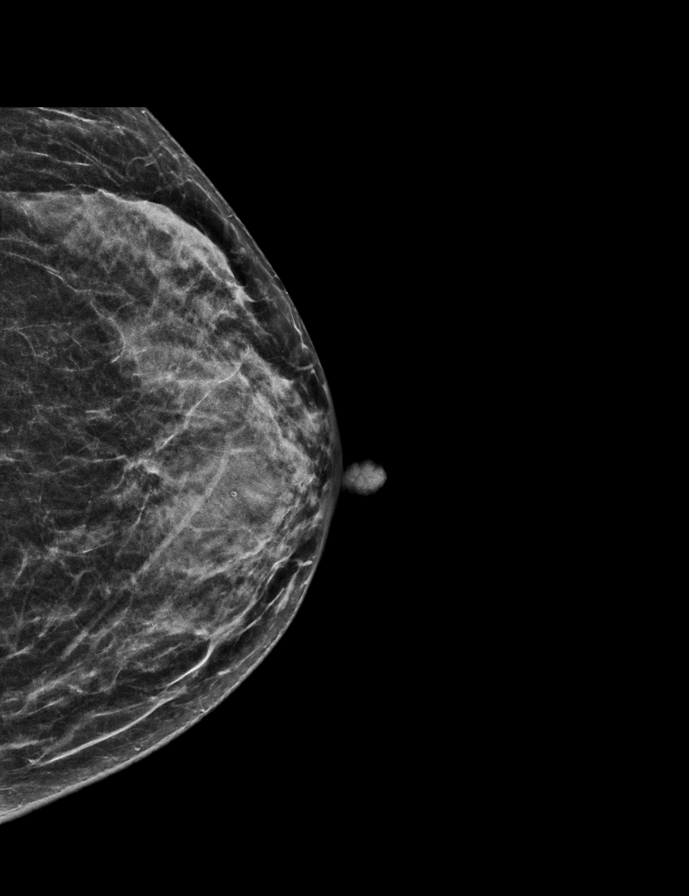

[L MLO synth-2D]
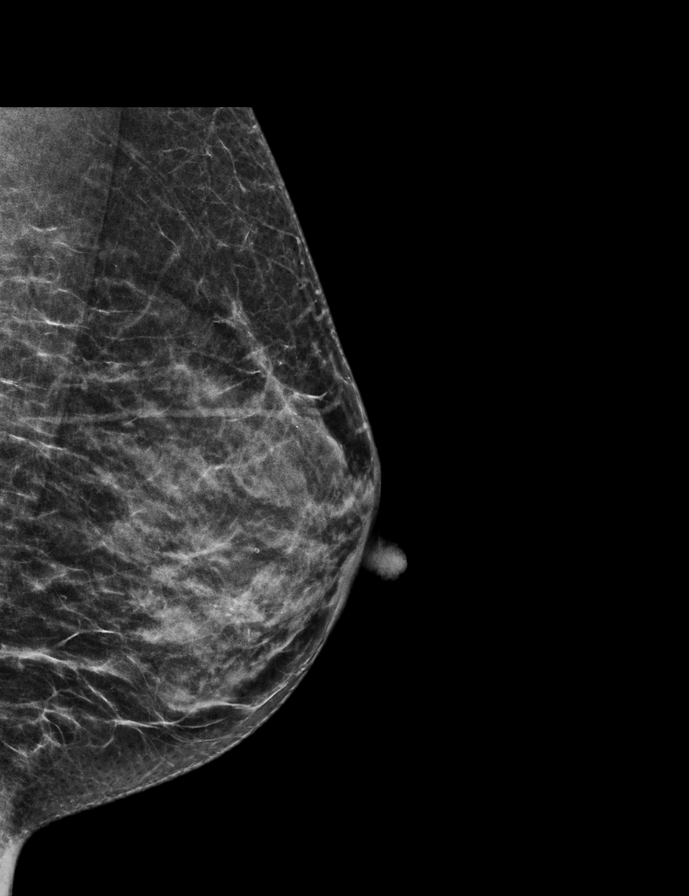

[R CC]
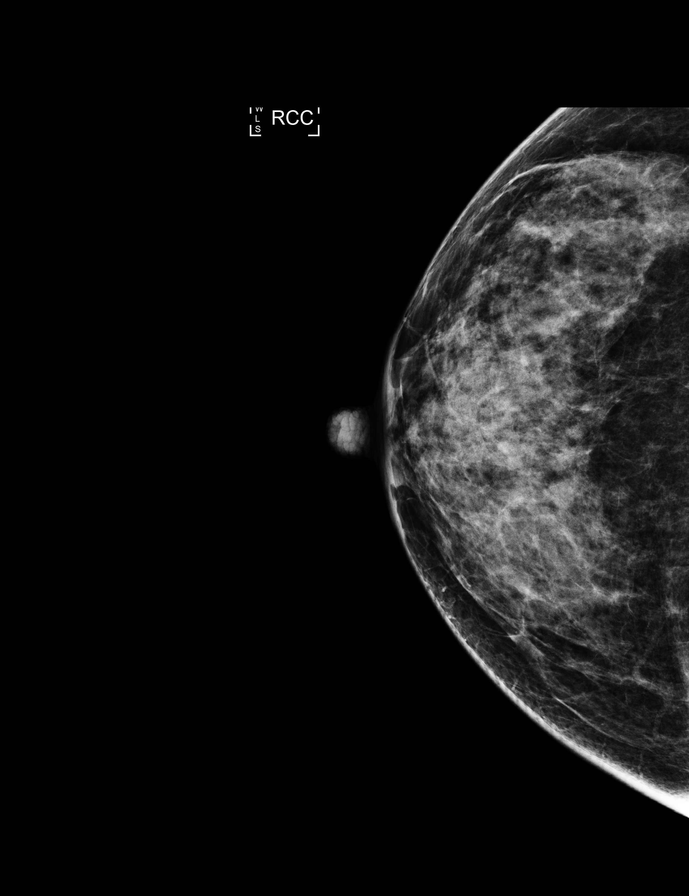

[R MLO synth-2D]
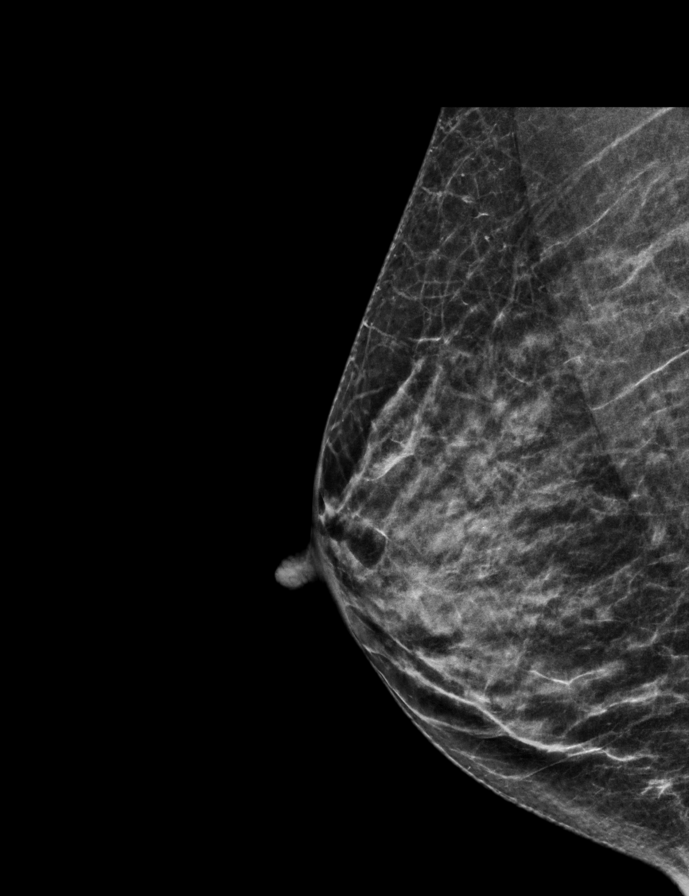

[R CC synth-2D]
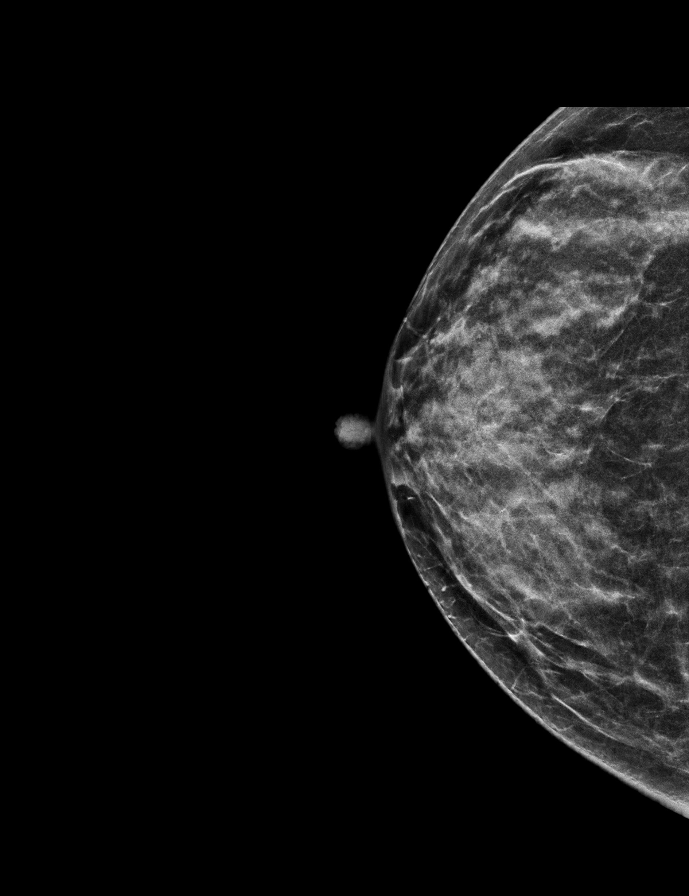

[R MLO]
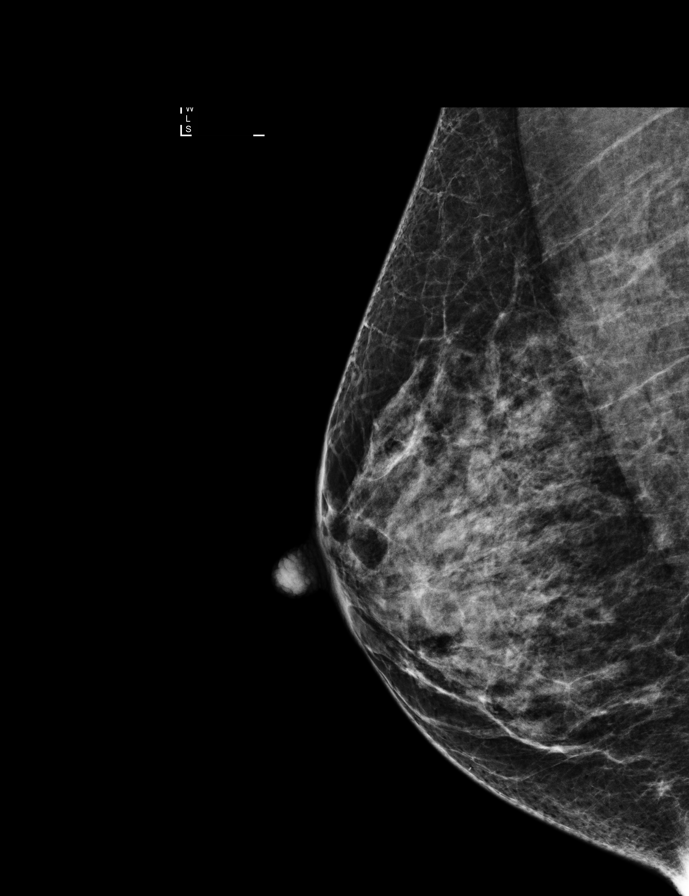

[R CC tomo · tomo slice 27/52.0]
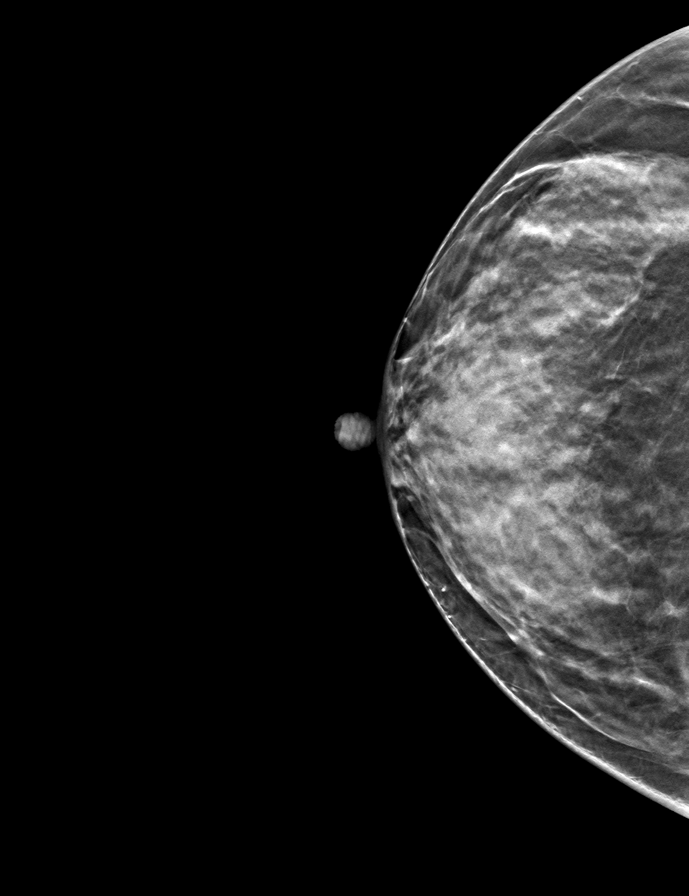

[9 of 28 positions shown; findings below may reference images not displayed]

ACR Breast Density Category d: The breast tissue is extremely dense,
which lowers the sensitivity of mammography.
FINDINGS: Normal appearing breast tissue bilaterally with no mammographic
findings suspicious for malignancy in either breast.

Mammographic images were processed with CAD.
IMPRESSION: No evidence of malignancy.

RECOMMENDATION:
Bilateral screening mammogram in 1 year. Annual screening
mammography is recommended due to the fact that the patient's mother
was diagnosed with breast cancer at approximately age 40.

I have discussed the findings and recommendations with the patient.
Results were also provided in writing at the conclusion of the
visit. If applicable, a reminder letter will be sent to the patient
regarding the next appointment.

BI-RADS CATEGORY  1: Negative.

## 2017-05-05 ENCOUNTER — Ambulatory Visit (INDEPENDENT_AMBULATORY_CARE_PROVIDER_SITE_OTHER): Payer: Managed Care, Other (non HMO) | Admitting: *Deleted

## 2017-05-05 DIAGNOSIS — E538 Deficiency of other specified B group vitamins: Secondary | ICD-10-CM | POA: Diagnosis not present

## 2017-05-05 MED ORDER — CYANOCOBALAMIN 1000 MCG/ML IJ SOLN
1000.0000 ug | Freq: Once | INTRAMUSCULAR | Status: AC
  Administered 2017-05-05: 1000 ug via INTRAMUSCULAR

## 2017-05-05 NOTE — Progress Notes (Signed)
Pt presented to office for 2nd B12 Injection today. Pt tolerated well. Pt knows to return next week for 3rd injection.

## 2017-05-10 NOTE — Progress Notes (Signed)
I agree with the above information, and approve the use of B12 injection for her condition.  Jarold MottoSamantha Yolandra Habig PA-C 05/10/17

## 2017-05-12 ENCOUNTER — Ambulatory Visit (INDEPENDENT_AMBULATORY_CARE_PROVIDER_SITE_OTHER): Payer: Managed Care, Other (non HMO)

## 2017-05-12 DIAGNOSIS — E538 Deficiency of other specified B group vitamins: Secondary | ICD-10-CM

## 2017-05-12 MED ORDER — CYANOCOBALAMIN 1000 MCG/ML IJ SOLN
1000.0000 ug | Freq: Once | INTRAMUSCULAR | Status: AC
  Administered 2017-05-12: 1000 ug via INTRAMUSCULAR

## 2017-05-19 ENCOUNTER — Ambulatory Visit (INDEPENDENT_AMBULATORY_CARE_PROVIDER_SITE_OTHER): Payer: Managed Care, Other (non HMO) | Admitting: *Deleted

## 2017-05-19 DIAGNOSIS — E538 Deficiency of other specified B group vitamins: Secondary | ICD-10-CM

## 2017-05-19 MED ORDER — CYANOCOBALAMIN 1000 MCG/ML IJ SOLN
1000.0000 ug | Freq: Once | INTRAMUSCULAR | Status: AC
  Administered 2017-05-19: 1000 ug via INTRAMUSCULAR

## 2017-05-19 NOTE — Progress Notes (Signed)
Patient received 4th dose of B12 injection. She was instructed to come back in 1 month to start monthly series. Tolerated well.

## 2017-06-16 ENCOUNTER — Ambulatory Visit (INDEPENDENT_AMBULATORY_CARE_PROVIDER_SITE_OTHER): Payer: 59 | Admitting: *Deleted

## 2017-06-16 DIAGNOSIS — E538 Deficiency of other specified B group vitamins: Secondary | ICD-10-CM

## 2017-06-16 MED ORDER — CYANOCOBALAMIN 1000 MCG/ML IJ SOLN
1000.0000 ug | Freq: Once | INTRAMUSCULAR | Status: AC
  Administered 2017-06-16: 1000 ug via INTRAMUSCULAR

## 2017-06-16 NOTE — Progress Notes (Signed)
Patient came in for B12 injection. Appointment scheduled for 1 month.

## 2017-06-16 NOTE — Progress Notes (Signed)
Noted and agree. Sarah Parks  

## 2017-06-19 ENCOUNTER — Ambulatory Visit: Payer: Managed Care, Other (non HMO)

## 2017-07-05 ENCOUNTER — Encounter: Payer: Self-pay | Admitting: Family Medicine

## 2017-07-05 ENCOUNTER — Ambulatory Visit (INDEPENDENT_AMBULATORY_CARE_PROVIDER_SITE_OTHER): Payer: 59 | Admitting: Family Medicine

## 2017-07-05 VITALS — BP 118/72 | HR 91 | Temp 98.4°F | Ht 68.0 in | Wt 179.4 lb

## 2017-07-05 DIAGNOSIS — O039 Complete or unspecified spontaneous abortion without complication: Secondary | ICD-10-CM

## 2017-07-05 NOTE — Progress Notes (Addendum)
    Subjective:  Sarah Parks is a 37 y.o. female who presents today with a chief complaint of possible miscarriage.   HPI:  Possible miscarriage, new issue Patient is approximately [redacted] weeks pregnant.  She was called by her OB/GYN yesterday and was told that her hCG levels were declining and that she was probably having a miscarriage.  She was not having any symptoms at that time, however this morning started noticing mild cramping and vaginal bleeding.  She has had one spontaneous abortion in the past.  ROS: Per HPI  Objective:  Physical Exam: BP 118/72 (BP Location: Left Arm, Patient Position: Sitting, Cuff Size: Normal)   Pulse 91   Temp 98.4 F (36.9 C) (Oral)   Ht 5\' 8"  (1.727 m)   Wt 179 lb 6.4 oz (81.4 kg)   SpO2 97%   BMI 27.28 kg/m   Gen: NAD, resting comfortably GI: Normal bowel sounds present. Soft, Nontender, Nondistended.   Assessment/Plan:  Spontaneous abortion Patient now exhibiting symptoms of spontaneous abortion.  Her vital signs are stable and her abdominal exam is benign.  Consoled patient on her loss.  Given that she is now having symptoms, no indication for further testing or imaging at this time.  She did not have RhoGam with her last miscarriage-do not think she needs it now.  We will obtain records from her OB/GYN to verify this.   Discussed expectant management.  Recommended ibuprofen as needed for cramping.  Discussed reasons to return to care including prolonged vaginal bleeding and severe abdominal cramping.  Advised her to follow-up with her OB/GYN for next steps in management.  Katina Degreealeb M. Jimmey RalphParker, MD 07/05/2017 10:35 AM

## 2017-07-21 ENCOUNTER — Ambulatory Visit (INDEPENDENT_AMBULATORY_CARE_PROVIDER_SITE_OTHER): Payer: 59 | Admitting: *Deleted

## 2017-07-21 DIAGNOSIS — E538 Deficiency of other specified B group vitamins: Secondary | ICD-10-CM

## 2017-07-21 MED ORDER — CYANOCOBALAMIN 1000 MCG/ML IJ SOLN
1000.0000 ug | Freq: Once | INTRAMUSCULAR | Status: AC
  Administered 2017-07-21: 1000 ug via INTRAMUSCULAR

## 2017-07-21 NOTE — Progress Notes (Signed)
I have reviewed and agree with note, evaluation, plan.   Donato Studley, MD  

## 2017-07-21 NOTE — Progress Notes (Signed)
Pt presented to office for monthly B12 Injection. Pt tolerated well. Pt knows to return in one month for next injecton.

## 2017-09-22 ENCOUNTER — Encounter (HOSPITAL_COMMUNITY): Payer: Self-pay | Admitting: *Deleted

## 2017-09-22 ENCOUNTER — Other Ambulatory Visit: Payer: Self-pay

## 2017-09-26 NOTE — H&P (Signed)
Sarah Parks is an 37 y.o. femalDenny Parks. She was in the office late last week for a new OB work-up, found to have 6+ week missed abortion.  She had a similar issue 4 months ago.  She wants D&E for the missed ab, wants permanent sterility.  Pertinent Gynecological History: Last pap: normal Date: 07/2016 OB History: G6, P2042 SVD x 2   Menstrual History: Patient's last menstrual period was 07/19/2017 (approximate).    Past Medical History:  Diagnosis Date  . Anemia   . Cholestasis   . SVD (spontaneous vaginal delivery)    x 2    Past Surgical History:  Procedure Laterality Date  . BREAST BIOPSY Left    Fibroid tissue 2003  . DILATION AND EVACUATION N/A 05/14/2014   Procedure: DILATATION AND EVACUATION;  Surgeon: Lavina Hammanodd Alvester Eads, MD;  Location: [facility];  Service: Gynecology;  Laterality: N/A;  . INDUCED ABORTION    . WISDOM TOOTH EXTRACTION      Family History  Problem Relation Age of Onset  . Hypertension Brother   . Dementia Maternal Grandmother   . Cancer Maternal Grandfather   . Cancer Mother        breast  . Colon cancer Neg Hx     Social History:  reports that she has never smoked. She has never used smokeless tobacco. She reports that she drinks alcohol. She reports that she has current or past drug history. Drug: Marijuana.  Allergies: No Known Allergies  No medications prior to admission.    Review of Systems  Respiratory: Negative.   Cardiovascular: Negative.     Height 5\' 8"  (1.727 m), weight 82.1 kg (181 lb), last menstrual period 07/19/2017, unknown if currently breastfeeding. Physical Exam  Constitutional: She appears well-developed and well-nourished.  Neck: Neck supple. No thyromegaly present.  Cardiovascular: Normal rate, regular rhythm and normal heart sounds.  No murmur heard. Respiratory: Effort normal and breath sounds normal. No respiratory distress. She has no wheezes.  GI: Soft. She exhibits no distension and no mass. There is no tenderness.   Genitourinary: Vagina normal and uterus normal.  Genitourinary Comments: No adnexal mass    No results found for this or any previous visit (from the past 24 hour(s)).  No results found.  Assessment/Plan: Missed abortion at 6+ weeks, desires permanent sterility.  Medical and surgical options for missed ab discussed, she prefers D&E.  Surgical procedure, risks, options, failure rate and permanency of BTL, as well as D&E discussed.  Will admit for laparoscopic bilateral salpingectomy and D&E.    Leighton Roachodd D Guerline Happ 09/26/2017, 9:02 PM

## 2017-09-26 NOTE — Anesthesia Preprocedure Evaluation (Signed)
Anesthesia Evaluation  Patient identified by MRN, date of birth, ID band Patient awake    Reviewed: Allergy & Precautions, NPO status , Patient's Chart, lab work & pertinent test results  Airway Mallampati: III  TM Distance: >3 FB Neck ROM: Full    Dental no notable dental hx. (+) Teeth Intact   Pulmonary former smoker,    Pulmonary exam normal breath sounds clear to auscultation       Cardiovascular negative cardio ROS Normal cardiovascular exam Rhythm:Regular Rate:Normal     Neuro/Psych negative neurological ROS  negative psych ROS   GI/Hepatic GERD  ,Cholestasis of pregnancy   Endo/Other  negative endocrine ROS  Renal/GU negative Renal ROS     Musculoskeletal negative musculoskeletal ROS (+)   Abdominal   Peds  Hematology  (+) anemia ,   Anesthesia Other Findings   Reproductive/Obstetrics (+) Pregnancy                             Anesthesia Physical  Anesthesia Plan  ASA: II  Anesthesia Plan: General   Post-op Pain Management:    Induction: Intravenous  PONV Risk Score and Plan: Ondansetron, Dexamethasone, Midazolam and Scopolamine patch - Pre-op  Airway Management Planned: Oral ETT  Additional Equipment:   Intra-op Plan:   Post-operative Plan: Extubation in OR  Informed Consent: I have reviewed the patients History and Physical, chart, labs and discussed the procedure including the risks, benefits and alternatives for the proposed anesthesia with the patient or authorized representative who has indicated his/her understanding and acceptance.   Dental advisory given  Plan Discussed with: CRNA  Anesthesia Plan Comments:         Anesthesia Quick Evaluation

## 2017-09-27 ENCOUNTER — Ambulatory Visit (HOSPITAL_COMMUNITY): Payer: 59 | Admitting: Anesthesiology

## 2017-09-27 ENCOUNTER — Encounter (HOSPITAL_COMMUNITY)
Admission: RE | Disposition: A | Discharge status: Home / Self Care | Payer: Self-pay | Source: Ambulatory Visit | Attending: Obstetrics and Gynecology

## 2017-09-27 ENCOUNTER — Other Ambulatory Visit: Payer: Self-pay

## 2017-09-27 ENCOUNTER — Encounter (HOSPITAL_COMMUNITY): Payer: Self-pay

## 2017-09-27 ENCOUNTER — Ambulatory Visit (HOSPITAL_COMMUNITY)
Admission: RE | Admit: 2017-09-27 | Discharge: 2017-09-27 | Disposition: A | Discharge status: Home / Self Care | Payer: 59 | Source: Ambulatory Visit | Attending: Obstetrics and Gynecology | Admitting: Obstetrics and Gynecology

## 2017-09-27 DIAGNOSIS — Z302 Encounter for sterilization: Secondary | ICD-10-CM | POA: Insufficient documentation

## 2017-09-27 DIAGNOSIS — Z87891 Personal history of nicotine dependence: Secondary | ICD-10-CM | POA: Insufficient documentation

## 2017-09-27 DIAGNOSIS — O021 Missed abortion: Secondary | ICD-10-CM | POA: Diagnosis not present

## 2017-09-27 DIAGNOSIS — K219 Gastro-esophageal reflux disease without esophagitis: Secondary | ICD-10-CM | POA: Diagnosis not present

## 2017-09-27 HISTORY — PX: DILATION AND EVACUATION: SHX1459

## 2017-09-27 HISTORY — PX: LAPAROSCOPIC BILATERAL SALPINGECTOMY: SHX5889

## 2017-09-27 LAB — CBC
HCT: 39.3 % (ref 36.0–46.0)
Hemoglobin: 14 g/dL (ref 12.0–15.0)
MCH: 33.7 pg (ref 26.0–34.0)
MCHC: 35.6 g/dL (ref 30.0–36.0)
MCV: 94.7 fL (ref 78.0–100.0)
PLATELETS: 293 10*3/uL (ref 150–400)
RBC: 4.15 MIL/uL (ref 3.87–5.11)
RDW: 13.1 % (ref 11.5–15.5)
WBC: 12.2 10*3/uL — ABNORMAL HIGH (ref 4.0–10.5)

## 2017-09-27 SURGERY — DILATION AND EVACUATION, UTERUS
Anesthesia: General | Site: Vagina

## 2017-09-27 MED ORDER — LIDOCAINE HCL (CARDIAC) 20 MG/ML IV SOLN
INTRAVENOUS | Status: DC | PRN
  Administered 2017-09-27: 50 mg via INTRAVENOUS

## 2017-09-27 MED ORDER — BUPIVACAINE HCL (PF) 0.25 % IJ SOLN
INTRAMUSCULAR | Status: DC | PRN
  Administered 2017-09-27: 5 mL
  Administered 2017-09-27: 6 mL

## 2017-09-27 MED ORDER — SODIUM CHLORIDE 0.9 % IJ SOLN
INTRAMUSCULAR | Status: AC
  Filled 2017-09-27: qty 10

## 2017-09-27 MED ORDER — MEPERIDINE HCL 25 MG/ML IJ SOLN: 6.2500 mg | INTRAMUSCULAR | Status: DC | PRN

## 2017-09-27 MED ORDER — KETOROLAC TROMETHAMINE 30 MG/ML IJ SOLN
INTRAMUSCULAR | Status: AC
  Filled 2017-09-27: qty 1

## 2017-09-27 MED ORDER — SUGAMMADEX SODIUM 200 MG/2ML IV SOLN
INTRAVENOUS | Status: AC
  Filled 2017-09-27: qty 2

## 2017-09-27 MED ORDER — MIDAZOLAM HCL 2 MG/2ML IJ SOLN
INTRAMUSCULAR | Status: DC | PRN
  Administered 2017-09-27: 2 mg via INTRAVENOUS

## 2017-09-27 MED ORDER — DEXAMETHASONE SODIUM PHOSPHATE 4 MG/ML IJ SOLN
INTRAMUSCULAR | Status: AC
  Filled 2017-09-27: qty 1

## 2017-09-27 MED ORDER — ONDANSETRON HCL 4 MG/2ML IJ SOLN
INTRAMUSCULAR | Status: DC | PRN
  Administered 2017-09-27: 4 mg via INTRAVENOUS

## 2017-09-27 MED ORDER — FENTANYL CITRATE (PF) 100 MCG/2ML IJ SOLN
INTRAMUSCULAR | Status: DC | PRN
  Administered 2017-09-27: 100 ug via INTRAVENOUS
  Administered 2017-09-27 (×3): 50 ug via INTRAVENOUS

## 2017-09-27 MED ORDER — KETOROLAC TROMETHAMINE 30 MG/ML IJ SOLN
INTRAMUSCULAR | Status: DC | PRN
  Administered 2017-09-27: 30 mg via INTRAVENOUS

## 2017-09-27 MED ORDER — BUPIVACAINE HCL (PF) 0.25 % IJ SOLN
INTRAMUSCULAR | Status: AC
  Filled 2017-09-27: qty 30

## 2017-09-27 MED ORDER — PROPOFOL 10 MG/ML IV BOLUS
INTRAVENOUS | Status: AC
  Filled 2017-09-27: qty 20

## 2017-09-27 MED ORDER — DEXAMETHASONE SODIUM PHOSPHATE 10 MG/ML IJ SOLN
INTRAMUSCULAR | Status: DC | PRN
  Administered 2017-09-27: 4 mg via INTRAVENOUS

## 2017-09-27 MED ORDER — PROMETHAZINE HCL 25 MG/ML IJ SOLN: 6.2500 mg | INTRAMUSCULAR | Status: DC | PRN

## 2017-09-27 MED ORDER — FENTANYL CITRATE (PF) 250 MCG/5ML IJ SOLN
INTRAMUSCULAR | Status: AC
  Filled 2017-09-27: qty 5

## 2017-09-27 MED ORDER — LACTATED RINGERS IV SOLN
INTRAVENOUS | Status: DC
  Administered 2017-09-27 (×2): via INTRAVENOUS

## 2017-09-27 MED ORDER — SCOPOLAMINE 1 MG/3DAYS TD PT72
MEDICATED_PATCH | TRANSDERMAL | Status: AC
  Administered 2017-09-27: 1.5 mg via TRANSDERMAL
  Filled 2017-09-27: qty 1

## 2017-09-27 MED ORDER — SUGAMMADEX SODIUM 200 MG/2ML IV SOLN
INTRAVENOUS | Status: DC | PRN
  Administered 2017-09-27: 150 mg via INTRAVENOUS

## 2017-09-27 MED ORDER — SCOPOLAMINE 1 MG/3DAYS TD PT72
1.0000 | MEDICATED_PATCH | Freq: Once | TRANSDERMAL | Status: DC
  Administered 2017-09-27: 1.5 mg via TRANSDERMAL

## 2017-09-27 MED ORDER — PROPOFOL 10 MG/ML IV BOLUS
INTRAVENOUS | Status: DC | PRN
  Administered 2017-09-27: 200 mg via INTRAVENOUS

## 2017-09-27 MED ORDER — LIDOCAINE HCL 2 % IJ SOLN
INTRAMUSCULAR | Status: DC | PRN
  Administered 2017-09-27: 16 mL

## 2017-09-27 MED ORDER — LIDOCAINE HCL 2 % IJ SOLN
INTRAMUSCULAR | Status: AC
  Filled 2017-09-27: qty 20

## 2017-09-27 MED ORDER — LIDOCAINE HCL (PF) 1 % IJ SOLN
INTRAMUSCULAR | Status: AC
  Filled 2017-09-27: qty 5

## 2017-09-27 MED ORDER — SODIUM CHLORIDE 0.9 % IV SOLN
100.0000 mg | Freq: Once | INTRAVENOUS | Status: AC
  Administered 2017-09-27: 100 mg via INTRAVENOUS
  Filled 2017-09-27: qty 100

## 2017-09-27 MED ORDER — KETOROLAC TROMETHAMINE 30 MG/ML IJ SOLN: 30.0000 mg | Freq: Once | INTRAMUSCULAR | Status: DC | PRN

## 2017-09-27 MED ORDER — ROCURONIUM BROMIDE 100 MG/10ML IV SOLN
INTRAVENOUS | Status: DC | PRN
  Administered 2017-09-27: 40 mg via INTRAVENOUS

## 2017-09-27 MED ORDER — SODIUM CHLORIDE 0.9 % IJ SOLN
INTRAMUSCULAR | Status: DC | PRN
  Administered 2017-09-27: 10 mL

## 2017-09-27 MED ORDER — HYDROCODONE-ACETAMINOPHEN 5-325 MG PO TABS
1.0000 | ORAL_TABLET | Freq: Four times a day (QID) | ORAL | 0 refills | Status: DC | PRN
Start: 2017-09-27 — End: 2018-03-27

## 2017-09-27 MED ORDER — MIDAZOLAM HCL 2 MG/2ML IJ SOLN
INTRAMUSCULAR | Status: AC
  Filled 2017-09-27: qty 2

## 2017-09-27 MED ORDER — ONDANSETRON HCL 4 MG/2ML IJ SOLN
INTRAMUSCULAR | Status: AC
Start: 2017-09-27 — End: 2017-09-27
  Filled 2017-09-27: qty 2

## 2017-09-27 MED ORDER — HYDROMORPHONE HCL 1 MG/ML IJ SOLN: 0.2500 mg | INTRAMUSCULAR | Status: DC | PRN

## 2017-09-27 SURGICAL SUPPLY — 38 items
CATH ROBINSON RED A/P 16FR (CATHETERS) ×4 IMPLANT
DECANTER SPIKE VIAL GLASS SM (MISCELLANEOUS) ×4 IMPLANT
DERMABOND ADVANCED (GAUZE/BANDAGES/DRESSINGS) ×2
DERMABOND ADVANCED .7 DNX12 (GAUZE/BANDAGES/DRESSINGS) ×2 IMPLANT
DRSG OPSITE POSTOP 3X4 (GAUZE/BANDAGES/DRESSINGS) ×4 IMPLANT
DURAPREP 26ML APPLICATOR (WOUND CARE) ×4 IMPLANT
GLOVE BIO SURGEON STRL SZ8 (GLOVE) ×4 IMPLANT
GLOVE BIOGEL PI IND STRL 7.0 (GLOVE) ×4 IMPLANT
GLOVE BIOGEL PI INDICATOR 7.0 (GLOVE) ×4
GLOVE ORTHO TXT STRL SZ7.5 (GLOVE) ×4 IMPLANT
GOWN STRL REUS W/TWL LRG LVL3 (GOWN DISPOSABLE) ×8 IMPLANT
KIT BERKELEY 1ST TRIMESTER 3/8 (MISCELLANEOUS) ×4 IMPLANT
NEEDLE INSUFFLATION 120MM (ENDOMECHANICALS) ×4 IMPLANT
NS IRRIG 1000ML POUR BTL (IV SOLUTION) ×4 IMPLANT
PACK LAPAROSCOPY BASIN (CUSTOM PROCEDURE TRAY) ×4 IMPLANT
PACK TRENDGUARD 450 HYBRID PRO (MISCELLANEOUS) IMPLANT
PACK TRENDGUARD 600 HYBRD PROC (MISCELLANEOUS) IMPLANT
PACK VAGINAL MINOR WOMEN LF (CUSTOM PROCEDURE TRAY) ×4 IMPLANT
PAD OB MATERNITY 4.3X12.25 (PERSONAL CARE ITEMS) ×4 IMPLANT
PAD PREP 24X48 CUFFED NSTRL (MISCELLANEOUS) ×4 IMPLANT
POUCH SPECIMEN RETRIEVAL 10MM (ENDOMECHANICALS) IMPLANT
PROTECTOR NERVE ULNAR (MISCELLANEOUS) ×8 IMPLANT
SET BERKELEY SUCTION TUBING (SUCTIONS) ×4 IMPLANT
SET IRRIG TUBING LAPAROSCOPIC (IRRIGATION / IRRIGATOR) IMPLANT
SHEARS HARMONIC ACE PLUS 36CM (ENDOMECHANICALS) IMPLANT
SUT VICRYL 0 UR6 27IN ABS (SUTURE) ×4 IMPLANT
SUT VICRYL 4-0 PS2 18IN ABS (SUTURE) ×4 IMPLANT
TOWEL OR 17X24 6PK STRL BLUE (TOWEL DISPOSABLE) ×8 IMPLANT
TRAY FOLEY CATH SILVER 14FR (SET/KITS/TRAYS/PACK) IMPLANT
TRENDGUARD 450 HYBRID PRO PACK (MISCELLANEOUS)
TRENDGUARD 600 HYBRID PROC PK (MISCELLANEOUS)
TROCAR XCEL NON-BLD 11X100MML (ENDOMECHANICALS) ×4 IMPLANT
TROCAR XCEL NON-BLD 5MMX100MML (ENDOMECHANICALS) ×4 IMPLANT
VACURETTE 10 RIGID CVD (CANNULA) IMPLANT
VACURETTE 7MM CVD STRL WRAP (CANNULA) ×4 IMPLANT
VACURETTE 8 RIGID CVD (CANNULA) IMPLANT
VACURETTE 9 RIGID CVD (CANNULA) IMPLANT
WARMER LAPAROSCOPE (MISCELLANEOUS) ×4 IMPLANT

## 2017-09-27 NOTE — Discharge Instructions (Signed)
DISCHARGE INSTRUCTIONS: Laparoscopy/D&E  Wound care:  Do not get the incision wet for the first 24 hours. The incision should be kept clean and dry.  The Band-Aids or dressings may be removed the day after surgery.  Should the incision become sore, red, and swollen after the first week, check with your doctor.  Activity and limitations:  Do NOT drive or operate any equipment today.  Do NOT lift anything more than 15 pounds for 2-3 weeks after surgery.  Do NOT rest in bed all day.  Walking is encouraged. Walk each day, starting slowly with 5-minute walks 3 or 4 times a day. Slowly increase the length of your walks.  Walk up and down stairs slowly.  Do NOT do strenuous activities, such as golfing, playing tennis, bowling, running, biking, weight lifting, gardening, mowing, or vacuuming for 2-4 weeks. Ask your doctor when it is okay to start.  Diet: Eat a light meal as desired this evening. You may resume your usual diet tomorrow.  Return to work: This is dependent on the type of work you do. For the most part you can return to a desk job within a week of surgery. If you are more active at work, please discuss this with your doctor.  What to expect after your surgery: You may have a slight burning sensation when you urinate on the first day. You may have a very small amount of blood in the urine. Expect to have a small amount of vaginal discharge/light bleeding for 1-2 weeks. It is not unusual to have abdominal soreness and bruising for up to 2 weeks. You may be tired and need more rest for about 1 week. You may experience shoulder pain for 24-72 hours. Lying flat in bed may relieve it.  Personal hygiene:  Use sanitary pads for vaginal drainage, not tampons.  Shower the day after your procedure.  NO tub baths, pools or Jacuzzis for 2-3 weeks.  Wipe front to back after using the bathroom.  Sexual activity: NO intercourse for at least 2 weeks after the procedure, or as indicated by  your physician.  What to expect after your surgery: Expect to have vaginal bleeding/discharge for 2-3 days and spotting for up to 10 days. It is not unusual to have soreness for up to 1-2 weeks. You may have a slight burning sensation when you urinate for the first day. Mild cramps may continue for a couple of days. You may have a regular period in 2-6 weeks.  Call your doctor for any of the following:  Excessive vaginal bleeding, saturating and changing one pad every hour.  Inability to urinate 6 hours after discharge from hospital.  Pain not relieved by pain medication.  Fever of 100.4 F or greater.  Unusual vaginal discharge or odor.  Special Instructions/Symptoms: Your throat may feel dry or sore from the anesthesia or the breathing tube placed in your throat during surgery. If this causes discomfort, gargle with warm salt water. The discomfort should disappear within 24 hours.  If you had a scopolamine patch placed behind your ear for the management of post- operative nausea and/or vomiting:  1. The medication in the patch is effective for 72 hours, after which it should be removed.  Wrap patch in a tissue and discard in the trash. Wash hands thoroughly with soap and water. 2. You may remove the patch earlier than 72 hours if you experience unpleasant side effects which may include dry mouth, dizziness or visual disturbances. 3. Avoid touching the  patch. Wash your hands with soap and water after contact with the patch.

## 2017-09-27 NOTE — Anesthesia Postprocedure Evaluation (Signed)
Anesthesia Post Note  Patient: Denny PeonDanielle G Luczynski  Procedure(s) Performed: DILATATION AND EVACUATION (N/A Vagina ) LAPAROSCOPIC BILATERAL SALPINGECTOMY (Bilateral Abdomen)     Patient location during evaluation: PACU Anesthesia Type: General Level of consciousness: sedated and patient cooperative Pain management: pain level controlled Vital Signs Assessment: post-procedure vital signs reviewed and stable Respiratory status: spontaneous breathing Cardiovascular status: stable Anesthetic complications: no    Last Vitals:  Vitals:   09/27/17 0939 09/27/17 1020  BP:  124/71  Pulse: 86 87  Resp: 16 18  Temp:  36.9 C  SpO2: 100% 100%    Last Pain:  Vitals:   09/27/17 1020  TempSrc:   PainSc: 3    Pain Goal: Patients Stated Pain Goal: 3 (09/27/17 1020)               Lewie LoronJohn Emalene Welte

## 2017-09-27 NOTE — Transfer of Care (Signed)
Immediate Anesthesia Transfer of Care Note  Patient: Sarah Parks  Procedure(s) Performed: DILATATION AND EVACUATION (N/A Vagina ) LAPAROSCOPIC BILATERAL SALPINGECTOMY (Bilateral Abdomen)  Patient Location: PACU  Anesthesia Type:General  Level of Consciousness: awake, alert  and oriented  Airway & Oxygen Therapy: Patient Spontanous Breathing and Patient connected to nasal cannula oxygen  Post-op Assessment: Report given to RN and Post -op Vital signs reviewed and stable  Post vital signs: Reviewed and stable  Last Vitals:  Vitals Value Taken Time  BP 114/90 09/27/2017  8:38 AM  Temp    Pulse 106 09/27/2017  8:40 AM  Resp 20 09/27/2017  8:40 AM  SpO2 97 % 09/27/2017  8:40 AM  Vitals shown include unvalidated device data.  Last Pain:  Vitals:   09/27/17 0605  TempSrc: Oral      Patients Stated Pain Goal: 3 (09/27/17 [id number])  Complications: No apparent anesthesia complications

## 2017-09-27 NOTE — Anesthesia Procedure Notes (Signed)
Procedure Name: Intubation Date/Time: 09/27/2017 7:33 AM Performed by: Cleda ClarksBrowder, Flynt Breeze R, CRNA Pre-anesthesia Checklist: Patient identified, Emergency Drugs available, Suction available and Patient being monitored Patient Re-evaluated:Patient Re-evaluated prior to induction Oxygen Delivery Method: Circle system utilized Preoxygenation: Pre-oxygenation with 100% oxygen Induction Type: IV induction Ventilation: Mask ventilation without difficulty Laryngoscope Size: Miller and 2 Tube type: Oral Tube size: 7.0 mm Number of attempts: 1 Airway Equipment and Method: Stylet Placement Confirmation: ETT inserted through vocal cords under direct vision,  positive ETCO2 and breath sounds checked- equal and bilateral Secured at: 21 cm Tube secured with: Tape Dental Injury: Teeth and Oropharynx as per pre-operative assessment

## 2017-09-27 NOTE — Interval H&P Note (Signed)
History and Physical Interval Note:  09/27/2017 7:09 AM  Sarah Parks  has presented today for surgery, with the diagnosis of Missed AB, Sterilization  The various methods of treatment have been discussed with the patient and family. After consideration of risks, benefits and other options for treatment, the patient has consented to  Procedure(s): DILATATION AND EVACUATION (N/A) LAPAROSCOPIC BILATERAL SALPINGECTOMY (Bilateral) as a surgical intervention .  The patient's history has been reviewed, patient examined, no change in status, stable for surgery.  I have reviewed the patient's chart and labs.  Questions were answered to the patient's satisfaction.     Leighton Roachodd D Apryll Hinkle

## 2017-09-27 NOTE — Op Note (Signed)
Preoperative diagnosis: Desires surgical sterility, missed abortion Postoperative diagnosis: Same Procedure: Laparoscopic bilateral salpingectomy, D&E Surgeon: Lavina Hammanodd Yamir Carignan M.D. Anesthesia: Gen. Endotracheal tube Findings: She had a normal abdomen and pelvis with normal uterus tubes and ovaries Specimens: Bilateral fallopian tubes, POC Estimated blood loss: Minimal Complications: None  Procedure in detail  The patient was taken to the operating room and placed in the dorsosupine position. General anesthesia was induced. Her legs were placed in mobile stirrups and her left arm was tucked to her side. Abdomen, perineum and vagina were then prepped and draped in the usual sterile fashion, bladder drained with a Red Robinson catheter, a Hulka tenaculum was applied to the cervix for uterine manipulation. Infraumbilical skin was then infiltrated with quarter percent Marcaine and a 1 cm vertical incision was made. The veress needle was inserted into the peritoneal cavity and placement confirmed by the water drop test and an opening pressure of 2 mm of mercury. CO2 was insufflated to a pressure of 14 mm of mercury and the veress needle was removed. A 10/11 disposable trocar was then introduced with direct visualization with the laparoscope. A 5 mm port was then placed low in the midline also under direct visualization. Inspection revealed the above-mentioned findings with normal anatomy. The distal end of each tube was grasped and elevated. Using bipolar cautery I was able to free the distal end of each tube from the ovary and fulgurate across the mesosalpinx and a proximal portion of the fallopian tube. Scissors were then used to remove the fallopian tube. This is done bilaterally without difficulty. Both segments of tube were removed through the umbilical trocar. The 5 mm port was removed under direct visualization. All gas was allowed to deflate from the abdomen and the umbilical trocar was removed. One  figure-of-eight suture of 0 Vicryl was placed in the umbilical incision. Skin incisions were then closed with interrupted subcuticular sutures of 4-0 Vicryl followed by Dermabond.   Legs were then elevated in the stirrups and the Hulka tenaculum was removed. A Graves speculum was inserted in the vagina. The anterior lip of the cervix was grasped with a single-tooth tenaculum. Paracervical block was then performed with a total of 16 cc of 2% plain lidocaine. Uterus then sounded to 9 cm. Cervix was gradually easily dilated to size 25 dilator. A size 7 curved suction curet was then inserted without difficulty. Suction curettage was return was performed with return of products of conception. Sharp curettage was performed with which revealed good uterine cry in all quadrants and no significant tissue. Suction curettage was performed one more time which revealed minimal blood. The single-tooth tenaculum was removed from the cervix and bleeding was controlled with pressure. All instruments were removed from the vagina. The patient was taken to the recovery in stable condition after tolerating the procedure well. Counts were correct, she received doxycycline at the beginning of the procedure and had PAS hose on throughout the procedure.

## 2017-09-28 ENCOUNTER — Encounter (HOSPITAL_COMMUNITY): Payer: Self-pay | Admitting: Obstetrics and Gynecology

## 2018-03-27 ENCOUNTER — Ambulatory Visit (HOSPITAL_COMMUNITY): Payer: 59

## 2018-03-27 ENCOUNTER — Encounter: Payer: Self-pay | Admitting: Physician Assistant

## 2018-03-27 ENCOUNTER — Telehealth: Payer: Self-pay | Admitting: *Deleted

## 2018-03-27 ENCOUNTER — Ambulatory Visit (INDEPENDENT_AMBULATORY_CARE_PROVIDER_SITE_OTHER): Payer: 59 | Admitting: Physician Assistant

## 2018-03-27 VITALS — BP 110/80 | HR 89 | Temp 98.9°F | Ht 68.0 in | Wt 183.0 lb

## 2018-03-27 DIAGNOSIS — R1031 Right lower quadrant pain: Secondary | ICD-10-CM

## 2018-03-27 LAB — POCT URINALYSIS DIPSTICK
Bilirubin, UA: NEGATIVE
Glucose, UA: NEGATIVE
Ketones, UA: NEGATIVE
LEUKOCYTES UA: NEGATIVE
NITRITE UA: NEGATIVE
PROTEIN UA: NEGATIVE
Spec Grav, UA: 1.01 (ref 1.010–1.025)
Urobilinogen, UA: 0.2 E.U./dL
pH, UA: 7.5 (ref 5.0–8.0)

## 2018-03-27 LAB — COMPREHENSIVE METABOLIC PANEL
ALT: 20 U/L (ref 0–35)
AST: 19 U/L (ref 0–37)
Albumin: 4.4 g/dL (ref 3.5–5.2)
Alkaline Phosphatase: 58 U/L (ref 39–117)
BUN: 11 mg/dL (ref 6–23)
CALCIUM: 9.8 mg/dL (ref 8.4–10.5)
CHLORIDE: 104 meq/L (ref 96–112)
CO2: 27 meq/L (ref 19–32)
Creatinine, Ser: 0.83 mg/dL (ref 0.40–1.20)
GFR: 100.14 mL/min (ref >60.00)
GLUCOSE: 83 mg/dL (ref 70–99)
POTASSIUM: 4 meq/L (ref 3.5–5.1)
Sodium: 137 mEq/L (ref 135–145)
Total Bilirubin: 0.5 mg/dL (ref 0.2–1.2)
Total Protein: 8.1 g/dL (ref 6.0–8.3)

## 2018-03-27 LAB — CBC WITH DIFFERENTIAL/PLATELET
Basophils Absolute: 0.1 10*3/uL (ref 0.0–0.1)
Basophils Relative: 1.1 % (ref 0.0–3.0)
Eosinophils Absolute: 0.1 10*3/uL (ref 0.0–0.7)
Eosinophils Relative: 1.6 % (ref 0.0–5.0)
HEMATOCRIT: 43.2 % (ref 36.0–46.0)
HEMOGLOBIN: 14.5 g/dL (ref 12.0–15.0)
LYMPHS PCT: 32.2 % (ref 12.0–46.0)
Lymphs Abs: 2.4 10*3/uL (ref 0.7–4.0)
MCHC: 33.5 g/dL (ref 30.0–36.0)
MCV: 98.6 fl (ref 78.0–100.0)
MONO ABS: 0.6 10*3/uL (ref 0.1–1.0)
Monocytes Relative: 8.2 % (ref 3.0–12.0)
NEUTROS PCT: 56.9 % (ref 43.0–77.0)
Neutro Abs: 4.2 10*3/uL (ref 1.4–7.7)
PLATELETS: 310 10*3/uL (ref 150.0–400.0)
RBC: 4.38 Mil/uL (ref 3.87–5.11)
RDW: 13.4 % (ref 11.5–15.5)
WBC: 7.5 10*3/uL (ref 4.0–10.5)

## 2018-03-27 LAB — LIPASE: LIPASE: 32 U/L (ref 11.0–59.0)

## 2018-03-27 LAB — POCT URINE PREGNANCY: Preg Test, Ur: NEGATIVE

## 2018-03-27 NOTE — Patient Instructions (Addendum)
It was great to see you!  We will contact you with your lab and urine results.   Please have your ultrasound performed.  If your pain worsens, you are unable to keep liquids or food down, please go to the ER.  Take care,  Jarold MottoSamantha Abby Stines PA-C   Abdominal Pain, Adult Many things can cause belly (abdominal) pain. Most times, belly pain is not dangerous. Many cases of belly pain can be watched and treated at home. Sometimes belly pain is serious, though. Your doctor will try to find the cause of your belly pain. Follow these instructions at home:  Take over-the-counter and prescription medicines only as told by your doctor. Do not take medicines that help you poop (laxatives) unless told to by your doctor.  Drink enough fluid to keep your pee (urine) clear or pale yellow.  Watch your belly pain for any changes.  Keep all follow-up visits as told by your doctor. This is important. Contact a doctor if:  Your belly pain changes or gets worse.  You are not hungry, or you lose weight without trying.  You are having trouble pooping (constipated) or have watery poop (diarrhea) for more than 2-3 days.  You have pain when you pee or poop.  Your belly pain wakes you up at night.  Your pain gets worse with meals, after eating, or with certain foods.  You are throwing up and cannot keep anything down.  You have a fever. Get help right away if:  Your pain does not go away as soon as your doctor says it should.  You cannot stop throwing up.  Your pain is only in areas of your belly, such as the right side or the left lower part of the belly.  You have bloody or black poop, or poop that looks like tar.  You have very bad pain, cramping, or bloating in your belly.  You have signs of not having enough fluid or water in your body (dehydration), such as: ? Dark pee, very little pee, or no pee. ? Cracked lips. ? Dry mouth. ? Sunken eyes. ? Sleepiness. ? Weakness. This information  is not intended to replace advice given to you by your health care provider. Make sure you discuss any questions you have with your health care provider. Document Released: 12/07/2007 Document Revised: 01/08/2016 Document Reviewed: 12/02/2015 [employer] Interactive Patient Education  2018 [employer]

## 2018-03-27 NOTE — Progress Notes (Signed)
Sarah Parks is a 37 y.o. female here for a new problem.  I acted as a Neurosurgeon for [name], PA-C Sarah Mull, LPN  History of Present Illness:   Chief Complaint  Patient presents with  . Abdominal Pain    Abdominal Pain  This is a new problem. The current episode started yesterday. The onset quality is sudden. The problem occurs constantly. The problem has been gradually worsening. The pain is located in the RLQ. The pain is at a severity of 5/10. The pain is moderate. The quality of the pain is sharp and burning. The abdominal pain does not radiate. Associated symptoms include constipation, headaches and nausea. Pertinent negatives include no belching, dysuria, fever, flatus, frequency, hematuria or vomiting. The pain is aggravated by bowel movement and certain positions. The pain is relieved by nothing. She has tried nothing for the symptoms.   Patient's last menstrual period was 02/23/2018 (exact date).  She had a bilateral salpingectomy in March. Ever since she has had this procedure, she has significant changes in her period -- changes in duration and flow.   Has had some recent constipation, which is not unusual for her.   Last meal was last night. Had some coffee this morning.  Having some pain with ambulation.  Past Medical History:  Diagnosis Date  . Anemia   . Cholestasis   . SVD (spontaneous vaginal delivery)    x 2     Social History   Socioeconomic History  . Marital status: Married    Spouse name: Not on file  . Number of children: Not on file  . Years of education: Not on file  . Highest education level: Not on file  Occupational History  . Not on file  Social Needs  . Financial resource strain: Not on file  . Food insecurity:    Worry: Not on file    Inability: Not on file  . Transportation needs:    Medical: Not on file    Non-medical: Not on file  Tobacco Use  . Smoking status: Never Smoker  . Smokeless tobacco: Never Used   Substance and Sexual Activity  . Alcohol use: Yes    Comment: But none with pregnancy-wine  . Drug use: Not Currently    Types: Marijuana    Comment: Last use 11/22/2007  . Sexual activity: Yes    Birth control/protection: None    Comment: approx [redacted] wks gestation  Lifestyle  . Physical activity:    Days per week: Not on file    Minutes per session: Not on file  . Stress: Not on file  Relationships  . Social connections:    Talks on phone: Not on file    Gets together: Not on file    Attends religious service: Not on file    Active member of club or organization: Not on file    Attends meetings of clubs or organizations: Not on file    Relationship status: Not on file  . Intimate partner violence:    Fear of current or ex partner: Not on file    Emotionally abused: Not on file    Physically abused: Not on file    Forced sexual activity: Not on file  Other Topics Concern  . Not on file  Social History Narrative  . Not on file    Past Surgical History:  Procedure Laterality Date  . BREAST BIOPSY Left    Fibroid tissue 2003  . DILATION AND EVACUATION N/A 05/14/2014  Procedure: DILATATION AND EVACUATION;  Surgeon: Lavina Hamman, MD;  Location: [facility];  Service: Gynecology;  Laterality: N/A;  . DILATION AND EVACUATION N/A 09/27/2017   Procedure: DILATATION AND EVACUATION;  Surgeon: Lavina Hamman, MD;  Location: [facility];  Service: Gynecology;  Laterality: N/A;  . INDUCED ABORTION    . LAPAROSCOPIC BILATERAL SALPINGECTOMY Bilateral 09/27/2017   Procedure: LAPAROSCOPIC BILATERAL SALPINGECTOMY;  Surgeon: Lavina Hamman, MD;  Location: [facility];  Service: Gynecology;  Laterality: Bilateral;  . WISDOM TOOTH EXTRACTION      Family History  Problem Relation Age of Onset  . Hypertension Brother   . Dementia Maternal Grandmother   . Cancer Maternal Grandfather   . Cancer Mother        breast  . Colon cancer Neg Hx     No Known Allergies  Current Medications:   Current  Outpatient Medications:  .  BIOTIN PO, Take 1 tablet by mouth daily., Disp: , Rfl:    Review of Systems:   Review of Systems  Constitutional: Negative for fever.  Gastrointestinal: Positive for abdominal pain, constipation and nausea. Negative for flatus and vomiting.  Genitourinary: Negative for dysuria, frequency and hematuria.  Neurological: Positive for headaches.    Vitals:   Vitals:   03/27/18 1021  BP: 110/80  Pulse: 89  Temp: 98.9 F (37.2 C)  TempSrc: Oral  SpO2: 98%  Weight: 183 lb (83 kg)  Height: 5\' 8"  (1.727 m)     Body mass index is 27.83 kg/m.  Physical Exam:   Physical Exam  Constitutional: She appears well-developed. She is cooperative.  Non-toxic appearance. She does not have a sickly appearance. She does not appear ill. No distress.  Cardiovascular: Normal rate, regular rhythm, S1 normal, S2 normal, normal heart sounds and normal pulses.  No LE edema  Pulmonary/Chest: Effort normal and breath sounds normal.  Abdominal: Normal appearance and bowel sounds are normal. There is tenderness in the right lower quadrant. There is guarding and tenderness at McBurney's point. There is no rigidity, no rebound, no CVA tenderness and negative Murphy's sign.  Neurological: She is alert. GCS eye subscore is 4. GCS verbal subscore is 5. GCS motor subscore is 6.  Skin: Skin is warm, dry and intact.  Psychiatric: She has a normal mood and affect. Her speech is normal and behavior is normal.  Nursing note and vitals reviewed.  Results for orders placed or performed in visit on 03/27/18  POCT urinalysis dipstick  Result Value Ref Range   Color, UA Yellow    Clarity, UA Clear    Glucose, UA Negative Negative   Bilirubin, UA Negative    Ketones, UA Negative    Spec Grav, UA 1.010 1.010 - 1.025   Blood, UA Trace    pH, UA 7.5 5.0 - 8.0   Protein, UA Negative Negative   Urobilinogen, UA 0.2 0.2 or 1.0 E.U./dL   Nitrite, UA Negative    Leukocytes, UA Negative Negative    Appearance     Odor    POCT urine pregnancy  Result Value Ref Range   Preg Test, Ur Negative Negative     Assessment and Plan:    Sarah Parks was seen today for abdominal pain.  Diagnoses and all orders for this visit:  Abdominal pain, RLQ -     POCT urinalysis dipstick -     CBC with Differential/Platelet -     Comprehensive metabolic panel -     POCT urine pregnancy -  Lipase -     US Abdomen Complete; Future   Exam is concerning for possible appendicitis vs ovarian cyst, among other differentials. Recommended stat CT however she declined 2/2 cost of test. Was agreeable to u/s. Labs today. If symptoms worsen or persist -- was instructed to go to ER. Patient verbalized understanding of plan and possible severity of situation.  . Reviewed expectations re: course of current medical issues. . Discussed self-management of symptoms. . Outlined signs and symptoms indicating need for more acute intervention. . Patient verbalized understanding and all questions were answered. . See orders for this visit as documented in the electronic medical record. . Patient received an After-Visit Summary.  CMA or LPN served as scribe during this visit. History, Physical, and Plan performed by medical provider. The above documentation has been reviewed and is accurate and complete.  Jarold MottoSamantha Chevis Weisensel, PA-C

## 2018-03-27 NOTE — Telephone Encounter (Signed)
See result note.  

## 2018-03-27 NOTE — Telephone Encounter (Signed)
Copied from CRM [id number]. Topic: General - Other >> Mar 27, 2018  2:21 PM Darletta MollLander, Lumin L wrote: Reason for CRM: Calling to get lab results from today and wants to know if CT scan is still needed? Would like a call back to discuss.

## 2018-03-28 ENCOUNTER — Telehealth: Payer: Self-pay | Admitting: Physician Assistant

## 2018-03-28 ENCOUNTER — Other Ambulatory Visit: Payer: Self-pay | Admitting: Physician Assistant

## 2018-03-28 DIAGNOSIS — R1031 Right lower quadrant pain: Secondary | ICD-10-CM

## 2018-03-28 NOTE — Telephone Encounter (Signed)
Please call patient and see how she is feeling.  If she is agreeable, I have put in the order for the stat CT of her abdomen and pelvis. We will proceed with scheduling if she would still like this performed.  Any worsening symptoms, needs to go to the ER.  [name] PA-C

## 2018-03-28 NOTE — Telephone Encounter (Signed)
Spoke to pt asked her how she if feeling today? Pt said the same, no increase in pain RLQ and nausea. Pt asked if she still needs the CT scan? Asked pt any new symptoms, does the pain radiate anywhere or any vomiting? Pt said no, she was the most nauseated yesterday today it is just there like it always is when she is ready to start her menstrual cycle. Asked pt are you able to eat and drink? Pt said yes. Have you had a bowel movement since we saw you? Pt said no. Told pt discussed with Lelon Mast and she said okay to keep [place] on stand by and hold off on CT. If any increase of pain or severe nausea and vomiting need to go to the ED. Pt verbalized understanding. Asked pt when cycle is due? Pt said any day now. Told pt okay keep [place] posted and I will call and check on you tomorrow. Pt verbalized understanding.

## 2018-04-02 NOTE — Telephone Encounter (Signed)
Spoke to pt asked her how she is feeling? Pt said "better but still having pain RLQ off and on but nothing like it was." Asked pt if she got her period yet? Pt said no she is 5 days late. Told pt she should contact her GYN since she is late and still having pain off and on. Pt verbalized understanding.

## 2018-06-18 ENCOUNTER — Other Ambulatory Visit: Payer: Self-pay | Admitting: Physician Assistant

## 2018-06-18 DIAGNOSIS — Z1231 Encounter for screening mammogram for malignant neoplasm of breast: Secondary | ICD-10-CM

## 2018-07-26 ENCOUNTER — Ambulatory Visit
Admission: RE | Admit: 2018-07-26 | Discharge: 2018-07-26 | Disposition: A | Discharge status: Home / Self Care | Payer: 59 | Source: Ambulatory Visit | Attending: Physician Assistant | Admitting: Physician Assistant

## 2018-07-26 DIAGNOSIS — Z1231 Encounter for screening mammogram for malignant neoplasm of breast: Secondary | ICD-10-CM

## 2018-07-26 IMAGING — MG DIGITAL SCREENING BILATERAL MAMMOGRAM WITH CAD
5 series · 5 of 5 positions shown · non-contrast
Comparison: Previous exam(s).

CLINICAL DATA: Screening.

EXAM:
DIGITAL SCREENING BILATERAL MAMMOGRAM WITH CAD

[R MLO (1 of 2)]
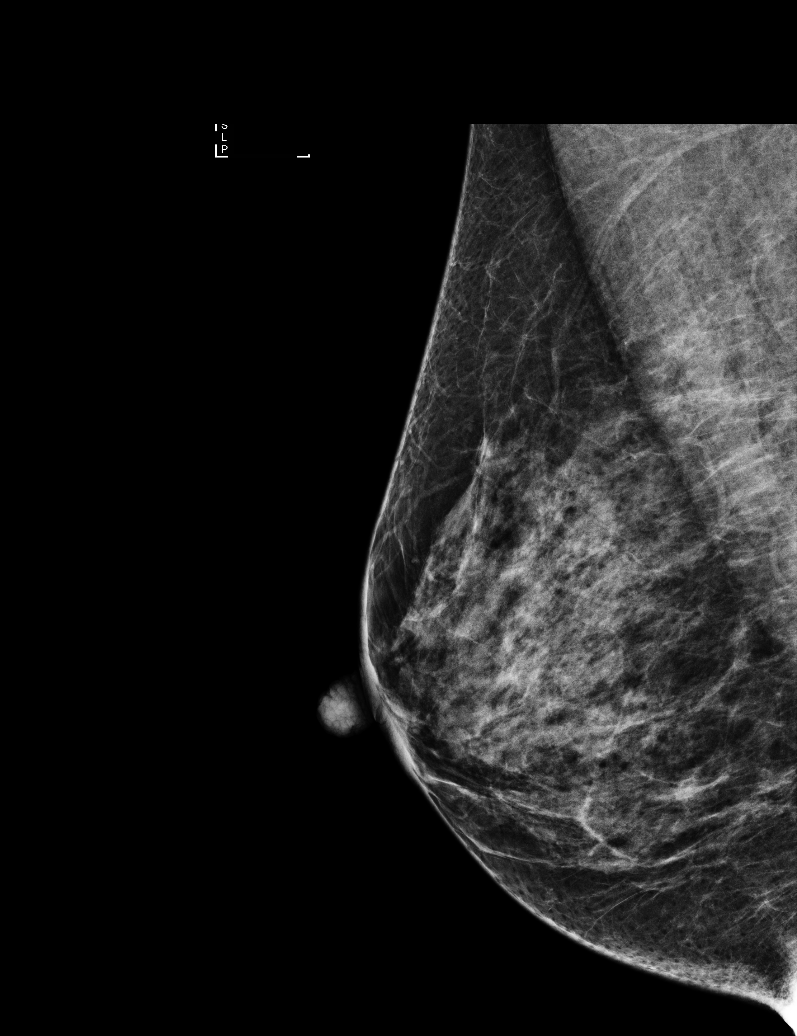

[L CC]
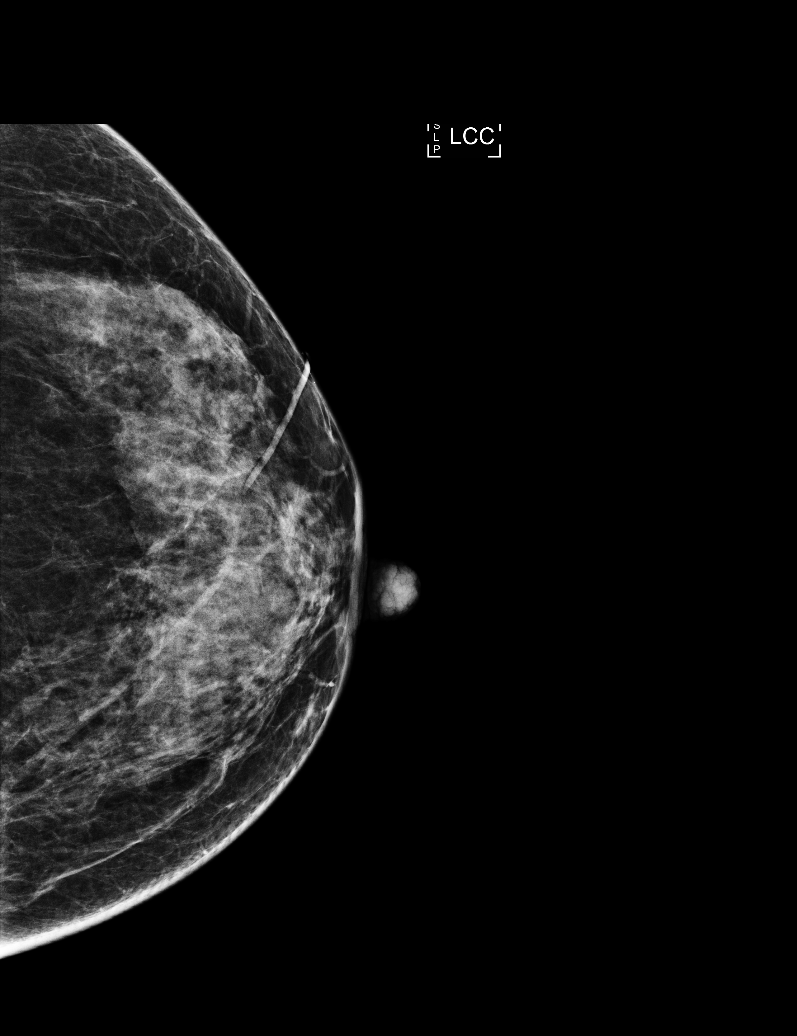

[L MLO]
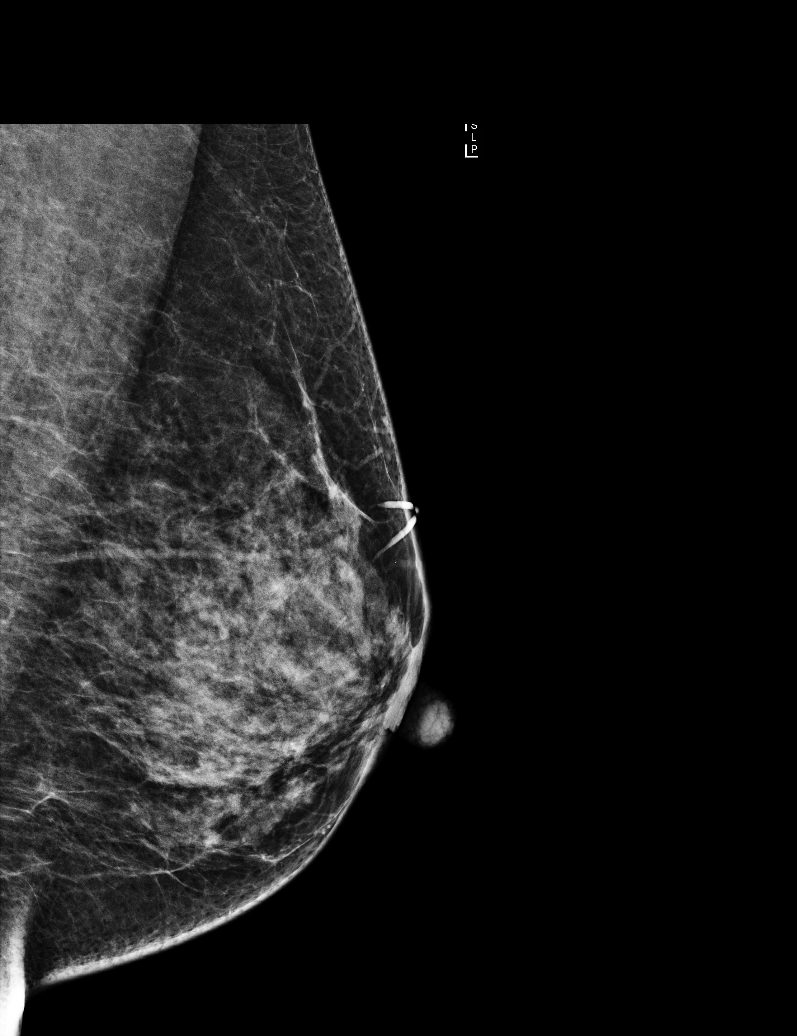

[R CC]
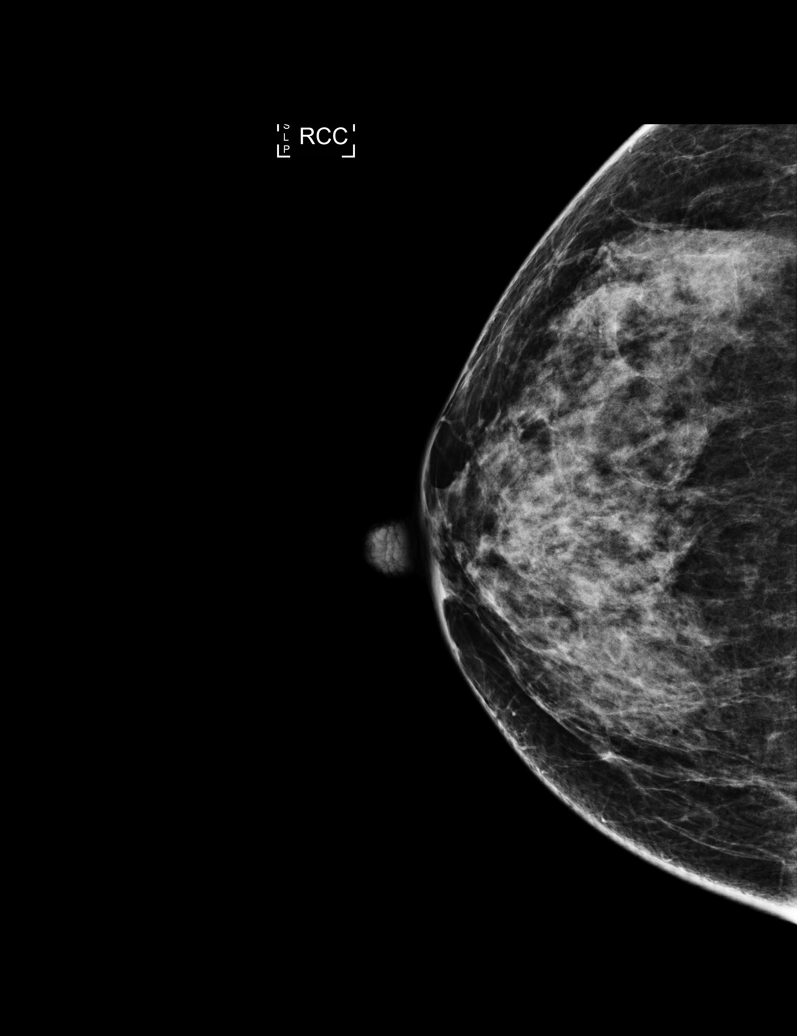

[R MLO (2 of 2)]
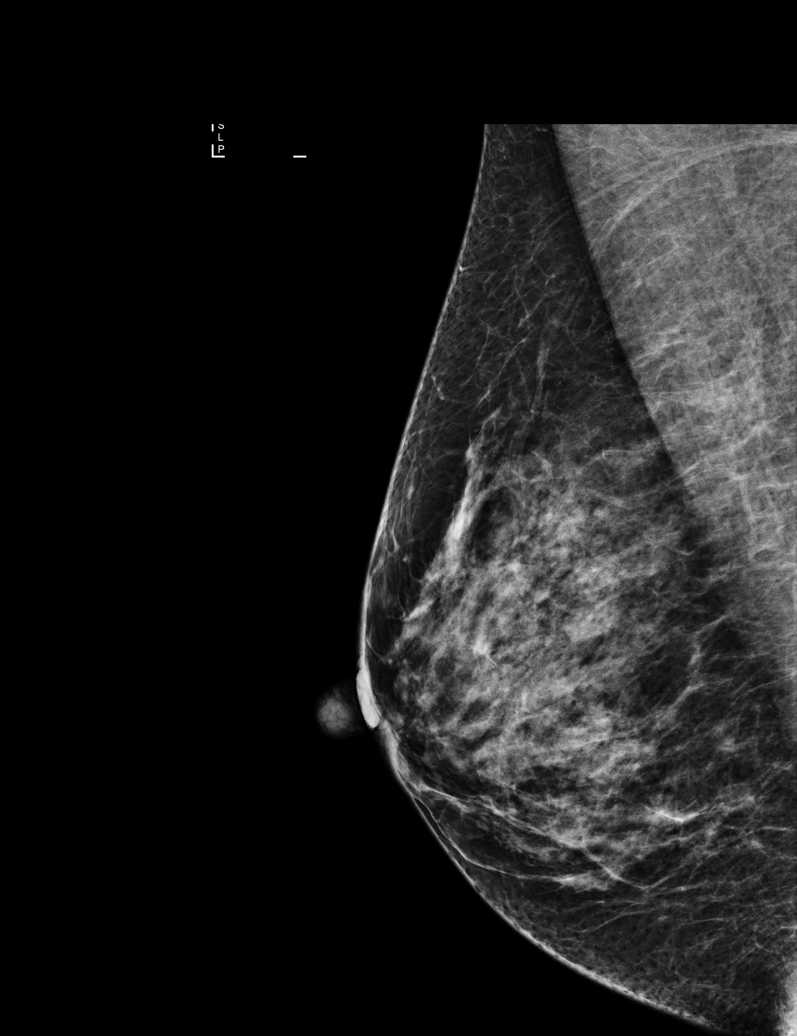

[5 of 5 positions shown; findings below may reference images not displayed]

ACR Breast Density Category c: The breast tissue is heterogeneously
dense, which may obscure small masses.
FINDINGS: There are no findings suspicious for malignancy. Images were
processed with CAD.
IMPRESSION: No mammographic evidence of malignancy. A result letter of this
screening mammogram will be mailed directly to the patient.

RECOMMENDATION:
Screening mammogram at age 41. (Code:[TJ])

BI-RADS CATEGORY  1: Negative.

## 2018-07-31 ENCOUNTER — Encounter: Payer: 59 | Admitting: Physician Assistant

## 2018-08-01 ENCOUNTER — Ambulatory Visit (INDEPENDENT_AMBULATORY_CARE_PROVIDER_SITE_OTHER): Payer: 59 | Admitting: Physician Assistant

## 2018-08-01 ENCOUNTER — Encounter: Payer: Self-pay | Admitting: Physician Assistant

## 2018-08-01 VITALS — BP 108/80 | HR 91 | Temp 99.3°F | Ht 68.0 in | Wt 182.0 lb

## 2018-08-01 DIAGNOSIS — Z136 Encounter for screening for cardiovascular disorders: Secondary | ICD-10-CM | POA: Diagnosis not present

## 2018-08-01 DIAGNOSIS — Z0001 Encounter for general adult medical examination with abnormal findings: Secondary | ICD-10-CM

## 2018-08-01 DIAGNOSIS — Z13220 Encounter for screening for lipoid disorders: Secondary | ICD-10-CM | POA: Diagnosis not present

## 2018-08-01 DIAGNOSIS — R5383 Other fatigue: Secondary | ICD-10-CM

## 2018-08-01 DIAGNOSIS — E538 Deficiency of other specified B group vitamins: Secondary | ICD-10-CM

## 2018-08-01 DIAGNOSIS — Z1331 Encounter for screening for depression: Secondary | ICD-10-CM

## 2018-08-01 DIAGNOSIS — R102 Pelvic and perineal pain: Secondary | ICD-10-CM

## 2018-08-01 DIAGNOSIS — D649 Anemia, unspecified: Secondary | ICD-10-CM

## 2018-08-01 LAB — LIPID PANEL
Cholesterol: 177 mg/dL (ref 0–200)
HDL: 48.7 mg/dL (ref >39.00)
LDL Cholesterol: 112 mg/dL — ABNORMAL HIGH (ref 0–99)
NonHDL: 128.71
Total CHOL/HDL Ratio: 4
Triglycerides: 82 mg/dL (ref 0.0–149.0)
VLDL: 16.4 mg/dL (ref 0.0–40.0)

## 2018-08-01 LAB — CBC WITH DIFFERENTIAL/PLATELET
Basophils Absolute: 0.1 10*3/uL (ref 0.0–0.1)
Basophils Relative: 1.3 % (ref 0.0–3.0)
Eosinophils Absolute: 0.1 10*3/uL (ref 0.0–0.7)
Eosinophils Relative: 1.2 % (ref 0.0–5.0)
HCT: 44.8 % (ref 36.0–46.0)
Hemoglobin: 15 g/dL (ref 12.0–15.0)
LYMPHS ABS: 2.2 10*3/uL (ref 0.7–4.0)
Lymphocytes Relative: 27.6 % (ref 12.0–46.0)
MCHC: 33.6 g/dL (ref 30.0–36.0)
MCV: 98.5 fl (ref 78.0–100.0)
Monocytes Absolute: 0.6 10*3/uL (ref 0.1–1.0)
Monocytes Relative: 8.1 % (ref 3.0–12.0)
Neutro Abs: 4.9 10*3/uL (ref 1.4–7.7)
Neutrophils Relative %: 61.8 % (ref 43.0–77.0)
Platelets: 316 10*3/uL (ref 150.0–400.0)
RBC: 4.55 Mil/uL (ref 3.87–5.11)
RDW: 13.2 % (ref 11.5–15.5)
WBC: 7.9 10*3/uL (ref 4.0–10.5)

## 2018-08-01 LAB — COMPREHENSIVE METABOLIC PANEL
ALK PHOS: 63 U/L (ref 39–117)
ALT: 21 U/L (ref 0–35)
AST: 14 U/L (ref 0–37)
Albumin: 4.4 g/dL (ref 3.5–5.2)
BUN: 11 mg/dL (ref 6–23)
CO2: 26 mEq/L (ref 19–32)
Calcium: 9.7 mg/dL (ref 8.4–10.5)
Chloride: 103 mEq/L (ref 96–112)
Creatinine, Ser: 0.9 mg/dL (ref 0.40–1.20)
GFR: 85.64 mL/min (ref >60.00)
Glucose, Bld: 88 mg/dL (ref 70–99)
POTASSIUM: 4.2 meq/L (ref 3.5–5.1)
Sodium: 136 mEq/L (ref 135–145)
Total Bilirubin: 0.7 mg/dL (ref 0.2–1.2)
Total Protein: 7.4 g/dL (ref 6.0–8.3)

## 2018-08-01 LAB — VITAMIN B12: Vitamin B-12: 203 pg/mL — ABNORMAL LOW (ref 211–911)

## 2018-08-01 LAB — TSH: TSH: 1.08 u[IU]/mL (ref 0.35–4.50)

## 2018-08-01 NOTE — Progress Notes (Signed)
Subjective:    Sarah Parks is a 38 y.o. female and is here for a comprehensive physical exam.  HPI  There are no preventive care reminders to display for this patient.  Acute Concerns: Pelvic discomfort -- R-sided. Consistently starts 3 days prior to period. Describes it as a "burning sensation." She doesn't take anything for it because it is not necessarily painful. Always comes 3 days prior to period. Hx of b/l salpingectomy. Denies unusual vaginal d/c. Has not seen her ob-gyn for this. Fatigue -- in general has had some increase in fatigue. Denies changes in mood. She is eating and drinking well. Just feels like it takes a lot more energy to do the things she normally does. Feels like she is getting adequate sleep.  Chronic Issues: B12 deficiency -- history of decreased levels. Does not take routine supplementation.  Health Maintenance: Immunizations -- UTD Colonoscopy -- n/a Mammogram -- n/a PAP -- UTD Diet -- has cleaned up her diet recently Sleep habits -- getting good sleep Exercise -- exercises well as able Current Weight -- Weight: 182 lb (82.6 kg)  Weight History: Wt Readings from Last 10 Encounters:  08/01/18 182 lb (82.6 kg)  03/27/18 183 lb (83 kg)  09/22/17 181 lb (82.1 kg)  07/05/17 179 lb 6.4 oz (81.4 kg)  04/27/17 176 lb (79.8 kg)  02/01/17 176 lb 8 oz (80.1 kg)  01/27/17 176 lb 6.4 oz (80 kg)  11/17/16 174 lb (78.9 kg)  06/12/15 196 lb (88.9 kg)  06/05/15 196 lb (88.9 kg)  Mood -- overall good No LMP recorded. Period characteristics -- Periods are variable Birth control -- tubal ligation  [facility]2018  Decreased Interest 0  Down, Depressed, Hopeless 0  PHQ - 2 Score 0  Altered sleeping 0  Tired, decreased energy 1  Change in appetite 0  Feeling bad or failure about yourself  0  Trouble concentrating 0  Moving slowly or fidgety/restless 0  Suicidal thoughts 0  PHQ-9 Score 1  Difficult doing work/chores Not  difficult at all     Other providers/specialists: Patient Care Team: Jarold MottoWorley, Payden Docter, [place] as PCP - General (Physician Assistant)   PMHx, SurgHx, SocialHx, Medications, and Allergies were reviewed in the Visit Navigator and updated as appropriate.   Past Medical History:  Diagnosis Date  . Anemia   . Cholestasis   . SVD (spontaneous vaginal delivery)    x 2     Past Surgical History:  Procedure Laterality Date  . BREAST BIOPSY Left    Fibroid tissue 2003  . DILATION AND EVACUATION N/A 05/14/2014   Procedure: DILATATION AND EVACUATION;  Surgeon: Lavina Hammanodd Meisinger, MD;  Location: [facility];  Service: Gynecology;  Laterality: N/A;  . DILATION AND EVACUATION N/A 09/27/2017   Procedure: DILATATION AND EVACUATION;  Surgeon: Lavina HammanMeisinger, Todd, MD;  Location: [facility];  Service: Gynecology;  Laterality: N/A;  . INDUCED ABORTION    . LAPAROSCOPIC BILATERAL SALPINGECTOMY Bilateral 09/27/2017   Procedure: LAPAROSCOPIC BILATERAL SALPINGECTOMY;  Surgeon: Lavina HammanMeisinger, Todd, MD;  Location: [facility];  Service: Gynecology;  Laterality: Bilateral;  . WISDOM TOOTH EXTRACTION       Family History  Problem Relation Age of Onset  . Hypertension Brother   . Dementia Maternal Grandmother   . Cancer Maternal Grandfather   . Cancer Mother        breast  . Breast cancer Mother   . Colon cancer Neg Hx     Social History   Tobacco  Use  . Smoking status: Never Smoker  . Smokeless tobacco: Never Used  Substance Use Topics  . Alcohol use: Yes    Comment: But none with pregnancy-wine  . Drug use: Not Currently    Types: Marijuana    Comment: Last use 11/22/2007    Review of Systems:   Review of Systems  Constitutional: Negative for chills, fever, malaise/fatigue and weight loss.  HENT: Negative for hearing loss, sinus pain and sore throat.   Respiratory: Negative for cough and hemoptysis.   Cardiovascular: Negative for chest pain, palpitations, leg swelling and PND.  Gastrointestinal: Negative for  abdominal pain, constipation, diarrhea, heartburn, nausea and vomiting.  Genitourinary: Negative for dysuria, frequency and urgency.       "pelvic burning" prior to periods  Musculoskeletal: Negative for back pain, myalgias and neck pain.  Skin: Negative for itching and rash.  Neurological: Negative for dizziness, tingling, seizures and headaches.  Endo/Heme/Allergies: Negative for polydipsia.  Psychiatric/Behavioral: Negative for depression. The patient is not nervous/anxious.     Objective:   BP 108/80   Pulse 91   Temp 99.3 F (37.4 C) (Oral)   Ht 5\' 8"  (1.727 m)   Wt 182 lb (82.6 kg)   SpO2 96%   BMI 27.67 kg/m   General Appearance:    Alert, cooperative, no distress, appears stated age  Head:    Normocephalic, without obvious abnormality, atraumatic  Eyes:    PERRL, conjunctiva/corneas clear, EOM's intact, fundi    benign, both eyes  Ears:    Normal TM's and external ear canals, both ears  Nose:   Nares normal, septum midline, mucosa normal, no drainage    or sinus tenderness  Throat:   Lips, mucosa, and tongue normal; teeth and gums normal  Neck:   Supple, symmetrical, trachea midline, no adenopathy;    thyroid:  no enlargement/tenderness/nodules; no carotid   bruit or JVD  Back:     Symmetric, no curvature, ROM normal, no CVA tenderness  Lungs:     Clear to auscultation bilaterally, respirations unlabored  Chest Wall:    No tenderness or deformity   Heart:    Regular rate and rhythm, S1 and S2 normal, no murmur, rub   or gallop  Breast Exam:    Deferred  Abdomen:     Soft, non-tender, bowel sounds active all four quadrants,    no masses, no organomegaly  Genitalia:    Deferred  Rectal:    Deferred  Extremities:   Extremities normal, atraumatic, no cyanosis or edema  Pulses:   2+ and symmetric all extremities  Skin:   Skin color, texture, turgor normal, no rashes or lesions  Lymph nodes:   Cervical, supraclavicular, and axillary nodes normal  Neurologic:   CNII-XII  intact, normal strength, sensation and reflexes    throughout    Assessment/Plan:   Sarah Parks was seen today for annual exam.  Diagnoses and all orders for this visit:  Encounter for general adult medical examination with abnormal findings Today patient counseled on age appropriate routine health concerns for screening and prevention, each reviewed and up to date or declined. Immunizations reviewed and up to date or declined. Labs ordered and reviewed. Risk factors for depression reviewed and negative. Hearing function and visual acuity are intact. ADLs screened and addressed as needed. Functional ability and level of safety reviewed and appropriate. Education, counseling and referrals performed based on assessed risks today. Patient provided with a copy of personalized plan for preventive services.  B12 deficiency  Recheck today. -     CBC with Differential/Platelet -     Vitamin B12  Other fatigue Multifactorial -- suspect due to caring for toddler and occasional poor eating. Has cut down on ETOH. Will check labs to r/o organic etiology. Follow-up if persists. -     CBC with Differential/Platelet -     Comprehensive metabolic panel -     TSH  Anemia, unspecified type -     CBC with Differential/Platelet  Encounter for lipid screening for cardiovascular disease -     Lipid panel  Pelvic pain She is currently asymptomatic. Will order ultrasound for her to schedule when she has her symptoms. Possible ovarian cyst? Any severe changes, instructed to go to ER. I did advise patient to keep track of her periods as she has not been doing this. -     US Pelvic Complete With Transvaginal; Future    Well Adult Exam: Labs ordered: Yes. Patient counseling was done. See below for items discussed. Discussed the patient's BMI. The BMI is in the acceptable range Follow up as needed for acute illness.  Patient Counseling:   [x]     Nutrition: Stressed importance of moderation in sodium/caffeine  intake, saturated fat and cholesterol, caloric balance, sufficient intake of fresh fruits, vegetables, fiber, calcium, iron, and 1 mg of folate supplement per day (for females capable of pregnancy).   [x]      Stressed the importance of regular exercise.    [x]     Substance Abuse: Discussed cessation/primary prevention of tobacco, alcohol, or other drug use; driving or other dangerous activities under the influence; availability of treatment for abuse.    [x]      Injury prevention: Discussed safety belts, safety helmets, smoke detector, smoking near bedding or upholstery.    [x]      Sexuality: Discussed sexually transmitted diseases, partner selection, use of condoms, avoidance of unintended pregnancy  and contraceptive alternatives.    [x]     Dental health: Discussed importance of regular tooth brushing, flossing, and dental visits.   [x]      Health maintenance and immunizations reviewed. Please refer to Health maintenance section.   CMA or LPN served as scribe during this visit. History, Physical, and Plan performed by medical provider. The above documentation has been reviewed and is accurate and complete.   Jarold MottoSamantha Corney Knighton, PA-C [facility]

## 2018-08-01 NOTE — Patient Instructions (Addendum)
It was great to see you!  Please go to the lab for blood work.   Our office will call you with your results unless you have chosen to receive results via MyChart.  If your blood work is normal we will follow-up each year for physicals and as scheduled for chronic medical problems.  If anything is abnormal we will treat accordingly and get you in for a follow-up.  Take care,  Sarah Parks  Please call Sarah Parks [phone] when you are ready to schedule your ultrasound (when you are having symptoms.)  Health Maintenance, Female Adopting a healthy lifestyle and getting preventive care can go a long way to promote health and wellness. Talk with your health care provider about what schedule of regular examinations is right for you. This is a good chance for you to check in with your provider about disease prevention and staying healthy. In between checkups, there are plenty of things you can do on your own. Experts have done a lot of research about which lifestyle changes and preventive measures are most likely to keep you healthy. Ask your health care provider for more information. Weight and diet Eat a healthy diet  Be sure to include plenty of vegetables, fruits, low-fat dairy products, and lean protein.  Do not eat a lot of foods high in solid fats, added sugars, or salt.  Get regular exercise. This is one of the most important things you can do for your health. ? Most adults should exercise for at least 150 minutes each week. The exercise should increase your heart rate and make you sweat (moderate-intensity exercise). ? Most adults should also do strengthening exercises at least twice a week. This is in addition to the moderate-intensity exercise. Maintain a healthy weight  Body mass index (BMI) is a measurement that can be used to identify possible weight problems. It estimates body fat based on height and weight. Your health care provider can help determine your BMI and help  you achieve or maintain a healthy weight.  For females 21 years of age and older: ? A BMI below 18.5 is considered underweight. ? A BMI of 18.5 to 24.9 is normal. ? A BMI of 25 to 29.9 is considered overweight. ? A BMI of 30 and above is considered obese. Watch levels of cholesterol and blood lipids  You should start having your blood tested for lipids and cholesterol at 37 years of age, then have this test every 5 years.  You may need to have your cholesterol levels checked more often if: ? Your lipid or cholesterol levels are high. ? You are older than 37 years of age. ? You are at high risk for heart disease. Cancer screening Lung Cancer  Lung cancer screening is recommended for adults 13-33 years old who are at high risk for lung cancer because of a history of smoking.  A yearly low-dose CT scan of the lungs is recommended for people who: ? Currently smoke. ? Have quit within the past 15 years. ? Have at least a 30-pack-year history of smoking. A pack year is smoking an average of one pack of cigarettes a day for 1 year.  Yearly screening should continue until it has been 15 years since you quit.  Yearly screening should stop if you develop a health problem that would prevent you from having lung cancer treatment. Breast Cancer  Practice breast self-awareness. This means understanding how your breasts normally appear and feel.  It also means doing regular breast self-exams.  Let your health care provider know about any changes, no matter how small.  If you are in your 20s or 30s, you should have a clinical breast exam (CBE) by a health care provider every 1-3 years as part of a regular health exam.  If you are 88 or older, have a CBE every year. Also consider having a breast X-ray (mammogram) every year.  If you have a family history of breast cancer, talk to your health care provider about genetic screening.  If you are at high risk for breast cancer, talk to your health  care provider about having an MRI and a mammogram every year.  Breast cancer gene (BRCA) assessment is recommended for women who have family members with BRCA-related cancers. BRCA-related cancers include: ? Breast. ? Ovarian. ? Tubal. ? Peritoneal cancers.  Results of the assessment will determine the need for genetic counseling and BRCA1 and BRCA2 testing. Cervical Cancer Your health care provider may recommend that you be screened regularly for cancer of the pelvic organs (ovaries, uterus, and vagina). This screening involves a pelvic examination, including checking for microscopic changes to the surface of your cervix (Pap test). You may be encouraged to have this screening done every 3 years, beginning at age 38.  For women ages 31-65, health care providers may recommend pelvic exams and Pap testing every 3 years, or they may recommend the Pap and pelvic exam, combined with testing for human papilloma virus (HPV), every 5 years. Some types of HPV increase your risk of cervical cancer. Testing for HPV may also be done on women of any age with unclear Pap test results.  Other health care providers may not recommend any screening for nonpregnant women who are considered low risk for pelvic cancer and who do not have symptoms. Ask your health care provider if a screening pelvic exam is right for you.  If you have had past treatment for cervical cancer or a condition that could lead to cancer, you need Pap tests and screening for cancer for at least 20 years after your treatment. If Pap tests have been discontinued, your risk factors (such as having a new sexual partner) need to be reassessed to determine if screening should resume. Some women have medical problems that increase the chance of getting cervical cancer. In these cases, your health care provider may recommend more frequent screening and Pap tests. Colorectal Cancer  This type of cancer can be detected and often prevented.  Routine  colorectal cancer screening usually begins at 37 years of age and continues through 37 years of age.  Your health care provider may recommend screening at an earlier age if you have risk factors for colon cancer.  Your health care provider may also recommend using home test kits to check for hidden blood in the stool.  A small camera at the end of a tube can be used to examine your colon directly (sigmoidoscopy or colonoscopy). This is done to check for the earliest forms of colorectal cancer.  Routine screening usually begins at age 75.  Direct examination of the colon should be repeated every 5-10 years through 37 years of age. However, you may need to be screened more often if early forms of precancerous polyps or small growths are found. Skin Cancer  Check your skin from head to toe regularly.  Tell your health care provider about any new moles or changes in moles, especially if there is a change in a mole's shape or color.  Also tell  your health care provider if you have a mole that is larger than the size of a pencil eraser.  Always use sunscreen. Apply sunscreen liberally and repeatedly throughout the day.  Protect yourself by wearing long sleeves, pants, a wide-brimmed hat, and sunglasses whenever you are outside. Heart disease, diabetes, and high blood pressure  High blood pressure causes heart disease and increases the risk of stroke. High blood pressure is more likely to develop in: ? People who have blood pressure in the high end of the normal range (130-139/85-89 mm Hg). ? People who are overweight or obese. ? People who are African American.  If you are 25-27 years of age, have your blood pressure checked every 3-5 years. If you are 74 years of age or older, have your blood pressure checked every year. You should have your blood pressure measured twice-once when you are at a hospital or clinic, and once when you are not at a hospital or clinic. Record the average of the two  measurements. To check your blood pressure when you are not at a hospital or clinic, you can use: ? An automated blood pressure machine at a pharmacy. ? A home blood pressure monitor.  If you are between 54 years and 74 years old, ask your health care provider if you should take aspirin to prevent strokes.  Have regular diabetes screenings. This involves taking a blood sample to check your fasting blood sugar level. ? If you are at a normal weight and have a low risk for diabetes, have this test once every three years after 37 years of age. ? If you are overweight and have a high risk for diabetes, consider being tested at a younger age or more often. Preventing infection Hepatitis B  If you have a higher risk for hepatitis B, you should be screened for this virus. You are considered at high risk for hepatitis B if: ? You were born in a country where hepatitis B is common. Ask your health care provider which countries are considered high risk. ? Your parents were born in a high-risk country, and you have not been immunized against hepatitis B (hepatitis B vaccine). ? You have HIV or AIDS. ? You use needles to inject street drugs. ? You live with someone who has hepatitis B. ? You have had sex with someone who has hepatitis B. ? You get hemodialysis treatment. ? You take certain medicines for conditions, including cancer, organ transplantation, and autoimmune conditions. Hepatitis C  Blood testing is recommended for: ? Everyone born from [birth year] through 1964. ? Anyone with known risk factors for hepatitis C. Sexually transmitted infections (STIs)  You should be screened for sexually transmitted infections (STIs) including gonorrhea and chlamydia if: ? You are sexually active and are younger than 37 years of age. ? You are older than 37 years of age and your health care provider tells you that you are at risk for this type of infection. ? Your sexual activity has changed since you were last  screened and you are at an increased risk for chlamydia or gonorrhea. Ask your health care provider if you are at risk.  If you do not have HIV, but are at risk, it may be recommended that you take a prescription medicine daily to prevent HIV infection. This is called pre-exposure prophylaxis (PrEP). You are considered at risk if: ? You are sexually active and do not regularly use condoms or know the HIV status of your partner(s). ? You take  drugs by injection. ? You are sexually active with a partner who has HIV. Talk with your health care provider about whether you are at high risk of being infected with HIV. If you choose to begin PrEP, you should first be tested for HIV. You should then be tested every 3 months for as long as you are taking PrEP. Pregnancy  If you are premenopausal and you may become pregnant, ask your health care provider about preconception counseling.  If you may become pregnant, take 400 to 800 micrograms (mcg) of folic acid every day.  If you want to prevent pregnancy, talk to your health care provider about birth control (contraception). Osteoporosis and menopause  Osteoporosis is a disease in which the bones lose minerals and strength with aging. This can result in serious bone fractures. Your risk for osteoporosis can be identified using a bone density scan.  If you are 66 years of age or older, or if you are at risk for osteoporosis and fractures, ask your health care provider if you should be screened.  Ask your health care provider whether you should take a calcium or vitamin D supplement to lower your risk for osteoporosis.  Menopause may have certain physical symptoms and risks.  Hormone replacement therapy may reduce some of these symptoms and risks. Talk to your health care provider about whether hormone replacement therapy is right for you. Follow these instructions at home:  Schedule regular health, dental, and eye exams.  Stay current with your  immunizations.  Do not use any tobacco products including cigarettes, chewing tobacco, or electronic cigarettes.  If you are pregnant, do not drink alcohol.  If you are breastfeeding, limit how much and how often you drink alcohol.  Limit alcohol intake to no more than 1 drink per day for nonpregnant women. One drink equals 12 ounces of beer, 5 ounces of wine, or 1 ounces of hard liquor.  Do not use street drugs.  Do not share needles.  Ask your health care provider for help if you need support or information about quitting drugs.  Tell your health care provider if you often feel depressed.  Tell your health care provider if you have ever been abused or do not feel safe at home. This information is not intended to replace advice given to you by your health care provider. Make sure you discuss any questions you have with your health care provider. Document Released: 01/03/2011 Document Revised: 11/26/2015 Document Reviewed: 03/24/2015 Elsevier Interactive Patient Education  2019 [employer].

## 2018-08-02 ENCOUNTER — Encounter: Payer: Self-pay | Admitting: Physician Assistant

## 2018-08-06 ENCOUNTER — Ambulatory Visit: Payer: 59 | Admitting: Physician Assistant

## 2018-08-20 ENCOUNTER — Ambulatory Visit
Admission: RE | Admit: 2018-08-20 | Discharge: 2018-08-20 | Disposition: A | Discharge status: Home / Self Care | Payer: 59 | Source: Ambulatory Visit | Attending: Physician Assistant | Admitting: Physician Assistant

## 2018-08-20 DIAGNOSIS — R102 Pelvic and perineal pain: Secondary | ICD-10-CM

## 2018-08-21 ENCOUNTER — Encounter: Payer: Self-pay | Admitting: Physician Assistant

## 2018-09-26 ENCOUNTER — Encounter: Payer: Self-pay | Admitting: Physician Assistant

## 2018-09-26 NOTE — Telephone Encounter (Signed)
Please call pt and schedule Webex for UTI, tell pt we can not just call in antibiotics UTI needs to talk to [facility] with Webex.

## 2018-09-27 ENCOUNTER — Ambulatory Visit (INDEPENDENT_AMBULATORY_CARE_PROVIDER_SITE_OTHER): Payer: 59 | Admitting: Physician Assistant

## 2018-09-27 ENCOUNTER — Other Ambulatory Visit: Payer: Self-pay | Admitting: Physician Assistant

## 2018-09-27 ENCOUNTER — Other Ambulatory Visit: Payer: Self-pay

## 2018-09-27 ENCOUNTER — Encounter: Payer: Self-pay | Admitting: Physician Assistant

## 2018-09-27 VITALS — Ht 68.0 in | Wt 183.0 lb

## 2018-09-27 DIAGNOSIS — R300 Dysuria: Secondary | ICD-10-CM | POA: Diagnosis not present

## 2018-09-27 LAB — POCT URINALYSIS DIPSTICK
Bilirubin, UA: NEGATIVE
Glucose, UA: NEGATIVE
Ketones, UA: NEGATIVE
Leukocytes, UA: NEGATIVE
Nitrite, UA: NEGATIVE
PH UA: 6 (ref 5.0–8.0)
Protein, UA: NEGATIVE
SPEC GRAV UA: 1.015 (ref 1.010–1.025)
Urobilinogen, UA: 0.2 E.U./dL

## 2018-09-27 MED ORDER — CEPHALEXIN 500 MG PO CAPS: 500.0000 mg | ORAL_CAPSULE | Freq: Three times a day (TID) | ORAL | 0 refills | Status: AC

## 2018-09-27 NOTE — Telephone Encounter (Signed)
See note  Copied from CRM [id number]. Topic: Appointment Scheduling - Scheduling Inquiry for Clinic >> Sep 27, 2018 10:08 AM Crist Infante wrote: Reason for CRM: pt states she was supposed to come back to the office today for elevated heart rate.  Pt states she thinks her watch was malfunctioning and she does not need to return.  I don't see any messages about this.

## 2018-09-27 NOTE — Progress Notes (Signed)
Virtual Visit via Video   I connected with Sarah Parks on 09/27/18 at  7:40 AM EDT by a video enabled telemedicine application and verified that I am speaking with the correct person using two identifiers. Location patient: Home Location provider: [facility], Office Persons participating in the virtual visit: [name], [name], [name], [name], PA-C  I discussed the limitations of evaluation and management by telemedicine and the availability of in person appointments. The patient expressed understanding and agreed to proceed.  Subjective:   HPI:  Dysuria and Urinary Frequency Pt c/o urinary symptoms started earlier this week having the urgency and frequency to urinate. Has seen some light pink in urine. Pt having slight back pain and nausea. Denies fever, chills, vaginal discharge. Has taken tylenol for pain.  Patient's last menstrual period was 09/05/2018.  ROS: See pertinent positives and negatives per HPI.  Patient Active Problem List   Diagnosis Date Noted  . Vitamin B12 deficiency 04/27/2017  . Back pain 01/27/2017  . Family history of malignant neoplasm of breast 07/28/2016  . Cholestasis of pregnancy in third trimester 06/12/2015  . SVD (spontaneous vaginal delivery) 06/12/2015  . Missed abortion 05/14/2014    Social History   Tobacco Use  . Smoking status: Never Smoker  . Smokeless tobacco: Never Used  Substance Use Topics  . Alcohol use: Yes    Comment: But none with pregnancy-wine    Current Outpatient Medications:  .  acetaminophen (TYLENOL) 500 MG tablet, Take 1,000-1,500 mg by mouth every 6 (six) hours as needed., Disp: , Rfl:   No Known Allergies  Objective:   VITALS: Per patient if applicable, see vitals. GENERAL: Alert, appears well and in no acute distress. HEENT: Atraumatic, conjunctiva clear, no obvious abnormalities on inspection of external nose and ears. NECK: Normal movements of the head and neck.  CARDIOPULMONARY: No increased WOB. Speaking in clear sentences. I:E ratio WNL.  MS: Moves all visible extremities without noticeable abnormality. PSYCH: Pleasant and cooperative, well-groomed. Speech normal rate and rhythm. Affect is appropriate. Insight and judgement are appropriate. Attention is focused, linear, and appropriate.  NEURO: CN grossly intact. Oriented as arrived to appointment on time with no prompting. Moves both UE equally.  SKIN: No obvious lesions, wounds, erythema, or cyanosis noted on face or hands.  Assessment and Plan:    Tyneesha was seen today for dysuria and urinary frequency.  Diagnoses and all orders for this visit:  Dysuria -     POCT urinalysis dipstick; Future -     Urine Culture; Future   No red flags on my discussion with patient. Pt will come by the office and give a urine sample and we will check UA and send for culture. Will rx treatment if indicated. Worsening precautions advised.  . Reviewed expectations re: course of current medical issues. . Discussed self-management of symptoms. . Outlined signs and symptoms indicating need for more acute intervention. . Patient verbalized understanding and all questions were answered. Marland Kitchen Health Maintenance issues including appropriate healthy diet, exercise, and smoking avoidance were discussed with patient. . See orders for this visit as documented in the electronic medical record.  I discussed the assessment and treatment plan with the patient. The patient was provided an opportunity to ask questions and all were answered. The patient agreed with the plan and demonstrated an understanding of the instructions.   The patient was advised to call back or seek an in-person evaluation if the symptoms worsen or if the condition  fails to improve as anticipated.  CMA or LPN served as scribe during this visit. History, Physical, and Plan performed by medical provider. The above documentation has been reviewed and is  accurate and complete.   I provided 20 minutes of non-face-to-face time during this encounter.   [place], [name] 09/27/2018

## 2018-09-27 NOTE — Addendum Note (Signed)
Addended by: Young Berry T on: 09/27/2018 08:35 AM   Modules accepted: Orders

## 2018-09-27 NOTE — Patient Instructions (Signed)
Urinary Tract Infection, Adult A urinary tract infection (UTI) is an infection of any part of the urinary tract. The urinary tract includes:  The kidneys.  The ureters.  The bladder.  The urethra. These organs make, store, and get rid of pee (urine) in the body. What are the causes? This is caused by germs (bacteria) in your genital area. These germs grow and cause swelling (inflammation) of your urinary tract. What increases the risk? You are more likely to develop this condition if:  You have a small, thin tube (catheter) to drain pee.  You cannot control when you pee or poop (incontinence).  You are female, and: ? You use these methods to prevent pregnancy: ? A medicine that kills sperm (spermicide). ? A device that blocks sperm (diaphragm). ? You have low levels of a female hormone (estrogen). ? You are pregnant.  You have genes that add to your risk.  You are sexually active.  You take antibiotic medicines.  You have trouble peeing because of: ? A prostate that is bigger than normal, if you are female. ? A blockage in the part of your body that drains pee from the bladder (urethra). ? A kidney stone. ? A nerve condition that affects your bladder (neurogenic bladder). ? Not getting enough to drink. ? Not peeing often enough.  You have other conditions, such as: ? Diabetes. ? A weak disease-fighting system (immune system). ? Sickle cell disease. ? Gout. ? Injury of the spine. What are the signs or symptoms? Symptoms of this condition include:  Needing to pee right away (urgently).  Peeing often.  Peeing small amounts often.  Pain or burning when peeing.  Blood in the pee.  Pee that smells bad or not like normal.  Trouble peeing.  Pee that is cloudy.  Fluid coming from the vagina, if you are female.  Pain in the belly or lower back. Other symptoms include:  Throwing up (vomiting).  No urge to eat.  Feeling mixed up (confused).  Being tired  and grouchy (irritable).  A fever.  Watery poop (diarrhea). How is this treated? This condition may be treated with:  Antibiotic medicine.  Other medicines.  Drinking enough water. Follow these instructions at home:  Medicines  Take over-the-counter and prescription medicines only as told by your doctor.  If you were prescribed an antibiotic medicine, take it as told by your doctor. Do not stop taking it even if you start to feel better. General instructions  Make sure you: ? Pee until your bladder is empty. ? Do not hold pee for a long time. ? Empty your bladder after sex. ? Wipe from front to back after pooping if you are a female. Use each tissue one time when you wipe.  Drink enough fluid to keep your pee pale yellow.  Keep all follow-up visits as told by your doctor. This is important. Contact a doctor if:  You do not get better after 1-2 days.  Your symptoms go away and then come back. Get help right away if:  You have very bad back pain.  You have very bad pain in your lower belly.  You have a fever.  You are sick to your stomach (nauseous).  You are throwing up. Summary  A urinary tract infection (UTI) is an infection of any part of the urinary tract.  This condition is caused by germs in your genital area.  There are many risk factors for a UTI. These include having a small, thin   tube to drain pee and not being able to control when you pee or poop.  Treatment includes antibiotic medicines for germs.  Drink enough fluid to keep your pee pale yellow. This information is not intended to replace advice given to you by your health care provider. Make sure you discuss any questions you have with your health care provider. Document Released: 12/07/2007 Document Revised: 12/28/2017 Document Reviewed: 12/28/2017 Elsevier Interactive Patient Education  2019 [employer] Inc[employer]  

## 2018-09-27 NOTE — Telephone Encounter (Signed)
Samantha notified of message from pt and okay for pt not to come in.

## 2018-09-27 NOTE — Addendum Note (Signed)
Addended by: Haynes Bast on: 09/27/2018 10:33 AM   Modules accepted: Kipp Brood

## 2018-09-28 LAB — URINE CULTURE
MICRO NUMBER:: 355705
Result:: NO GROWTH
SPECIMEN QUALITY: ADEQUATE

## 2018-10-05 ENCOUNTER — Encounter: Payer: Self-pay | Admitting: Physician Assistant

## 2018-10-05 ENCOUNTER — Ambulatory Visit: Payer: Self-pay

## 2018-10-05 ENCOUNTER — Ambulatory Visit (INDEPENDENT_AMBULATORY_CARE_PROVIDER_SITE_OTHER): Payer: 59 | Admitting: Physician Assistant

## 2018-10-05 DIAGNOSIS — R5383 Other fatigue: Secondary | ICD-10-CM | POA: Diagnosis not present

## 2018-10-05 NOTE — Telephone Encounter (Signed)
Incoming call from Patient  Reporting that she would like a virtual appointment.Patient she is just "feeling tired". Rates it mild.  Started 2 days ago.  Reports having diarrhea also.  LMP was March 27th.  Transferred Patient to [facility] for a consult for virtual appointment.    Reason for Disposition . [1] MILD weakness (i.e., does not interfere with ability to work, go to school, normal activities) AND [2] persists > 1 week  Answer Assessment - Initial Assessment Questions 1. DESCRIPTION: "Describe how you are feeling."     tired 2. SEVERITY: "How bad is it?"  "Can you stand and walk?"   - MILD - Feels weak or tired, but does not interfere with work, school or normal activities   - MODERATE - Able to stand and walk; weakness interferes with work, school, or normal activities   - SEVERE - Unable to stand or walk     mild 3. ONSET:  "When did the weakness begin?"     *No Answer*2 days ago 4. CAUSE: "What do you think is causing the weakness?"     *No Answer* 5. MEDICINES: "Have you recently started a new medicine or had a change in the amount of a medicine?"     *No Answer* 6. OTHER SYMPTOMS: "Do you have any other symptoms?" (e.g., chest pain, fever, cough, SOB, vomiting, diarrhea, bleeding, other areas of pain)     diarrhea 7. PREGNANCY: "Is there any chance you are pregnant?" "When was your last menstrual period?"     27 March  Protocols used: WEAKNESS (GENERALIZED) AND FATIGUE-A-AH

## 2018-10-05 NOTE — Patient Instructions (Signed)
It was great to see you!  You have a viral upper respiratory infection. Antibiotics are not needed for this.  Viral infections usually take 7-10 days to resolve.  The cough can last a few weeks to go away.  Given the current COVID-19 outbreak, it is my professional recommendation that for your symptoms you should self-isolate for at least 7 days from when your symptoms started. Please do not return to work until you are fever free for at least 3 days without medications and significant improvement of your symptoms.  If you develop severe shortness of breath, uncontrolled fevers, coughing up blood, confusion, chest pain, or signs of dehydration (such as significantly decreased urine amounts or dizziness with standing) please CALL the ER and then GO to the ER.  Push fluids and get plenty of rest.  Call clinic with questions.  I hope you start feeling better soon!  

## 2018-10-05 NOTE — Progress Notes (Signed)
   Virtual Visit via Video   I connected with Sarah Parks on 10/05/18 at  1:40 PM EDT by a video enabled telemedicine application and verified that I am speaking with the correct person using two identifiers. Location patient: Home Location provider: [facility], Office Persons participating in the virtual visit: Sarah Parks, Sarah Parks, Sarah Parks, Sarah Motto, PA-C   I discussed the limitations of evaluation and management by telemedicine and the availability of in person appointments. The patient expressed understanding and agreed to proceed.  Subjective:  I acted as a Neurosurgeon for [employer], PA-C [name], Sarah Parks  HPI:  Fatigue Pt having fatigue that she is taking naps during the day,  body aches and slight dry non-productive cough. Started few day ago. Denies fever, back pain or kidney pain. Pt had diarrhea over the weekend on Saturday thru Monday. Pt was on Keflex for UTI  3/26 which was probable cause of diarrhea. Pt is drinking adequately and getting outside going for walks.   Denies depression, dysuria, family with similar symptoms.  She is self-quarantining adequately.  ROS: See pertinent positives and negatives per HPI.  Patient Active Problem List   Diagnosis Date Noted  . Vitamin B12 deficiency 04/27/2017  . Back pain 01/27/2017  . Family history of malignant neoplasm of breast 07/28/2016  . Cholestasis of pregnancy in third trimester 06/12/2015  . SVD (spontaneous vaginal delivery) 06/12/2015  . Missed abortion 05/14/2014    Social History   Tobacco Use  . Smoking status: Never Smoker  . Smokeless tobacco: Never Used  Substance Use Topics  . Alcohol use: Yes    Comment: But none with pregnancy-wine    Current Outpatient Medications:  .  acetaminophen (TYLENOL) 500 MG tablet, Take 1,000-1,500 mg by mouth every 6 (six) hours as needed., Disp: , Rfl:  .  Cyanocobalamin (VITAMIN B-12 PO), Take 1 each by mouth daily., Disp: , Rfl:   No  Known Allergies  Objective:   VITALS: Per patient if applicable, see vitals. GENERAL: Alert, appears well and in no acute distress. HEENT: Atraumatic, conjunctiva clear, no obvious abnormalities on inspection of external nose and ears. NECK: Normal movements of the head and neck. CARDIOPULMONARY: No increased WOB. Speaking in clear sentences. I:E ratio WNL.  MS: Moves all visible extremities without noticeable abnormality. PSYCH: Pleasant and cooperative, well-groomed. Speech normal rate and rhythm. Affect is appropriate. Insight and judgement are appropriate. Attention is focused, linear, and appropriate.  NEURO: CN grossly intact. Oriented as arrived to appointment on time with no prompting. Moves both UE equally.  SKIN: No obvious lesions, wounds, erythema, or cyanosis noted on face or hands.  Assessment and Plan:   Sarah Parks was seen today for fatigue and generalized body aches.  Diagnoses and all orders for this visit:  Fatigue, unspecified type   No red flags on discussion. Discussed likely viral illness. Push fluids and rest. Monitor symptoms. Alert [place] if any new or unusual symptoms. Not appropriate for COVID-19 testing at this time.  . Reviewed expectations re: course of current medical issues. . Discussed self-management of symptoms. . Outlined signs and symptoms indicating need for more acute intervention. . Patient verbalized understanding and all questions were answered. Marland Kitchen Health Maintenance issues including appropriate healthy diet, exercise, and smoking avoidance were discussed with patient. . See orders for this visit as documented in the electronic medical record.  Sarah Parks, Sarah Parks 10/05/2018

## 2019-01-22 ENCOUNTER — Telehealth: Payer: Self-pay | Admitting: *Deleted

## 2019-01-22 NOTE — Telephone Encounter (Signed)
Spoke to pt told her calling about appt she made for pulled muscle in chest. Asked her if she was having chest pain with any radiation or numbness or tingling? Pt said no just discomfort when she moves at times, thinks she pulled a muscle but the pain has not gone away and not any worse. Told her okay if any chest pain, numbness or tingling down left arm or SOB need to go to the ER. Pt verbalized understanding.

## 2019-01-24 ENCOUNTER — Encounter: Payer: Self-pay | Admitting: Physician Assistant

## 2019-01-24 ENCOUNTER — Telehealth (INDEPENDENT_AMBULATORY_CARE_PROVIDER_SITE_OTHER): Payer: 59 | Admitting: Physician Assistant

## 2019-01-24 DIAGNOSIS — K5900 Constipation, unspecified: Secondary | ICD-10-CM

## 2019-01-24 DIAGNOSIS — R0789 Other chest pain: Secondary | ICD-10-CM | POA: Diagnosis not present

## 2019-01-24 DIAGNOSIS — K5904 Chronic idiopathic constipation: Secondary | ICD-10-CM | POA: Insufficient documentation

## 2019-01-24 NOTE — Progress Notes (Signed)
Virtual Visit via Video   I connected with Sarah Parks on 01/24/19 at  9:20 AM EDT by a video enabled telemedicine application and verified that I am speaking with the correct person using two identifiers. Location patient: Home Location provider: [facility], Office Persons participating in the virtual visit: Sarah Parks, [place] PA-C.  I discussed the limitations of evaluation and management by telemedicine and the availability of in person appointments. The patient expressed understanding and agreed to proceed.  I acted as a Education administrator for [employer], PA-C [employer], LPN  Subjective:   HPI:   Muscle strain Started about a week ago. Worst was Saturday or Sunday, pain was 6/10 -- pain now 4/10. Feels like the pain is inside. Pt c/o Left sided chest discomfort thinks pulled muscle, having pain when she moves at times. Pain does not radiate down arms and denies numbness and tingling, SOB. She has not taken anything for the pain. Does have slight pain with deep inspiration.   Constipation Has been trying to eat more fiber, drink more water. Going to the bathroom every other day and passing "pebbles." Denies rectal bleeding or concerns for hemorrhoids. Long standing history of constipation. Has not tried any medications for this worsening episode. Denies any new or unusual abd pain.  ROS: See pertinent positives and negatives per HPI.  Patient Active Problem List   Diagnosis Date Noted  . Constipation 01/24/2019  . Vitamin B12 deficiency 04/27/2017  . Back pain 01/27/2017  . Family history of malignant neoplasm of breast 07/28/2016  . Cholestasis of pregnancy in third trimester 06/12/2015  . SVD (spontaneous vaginal delivery) 06/12/2015  . Missed abortion 05/14/2014    Social History   Tobacco Use  . Smoking status: Never Smoker  . Smokeless tobacco: Never Used  Substance Use Topics  . Alcohol use: Yes    Comment: But none with pregnancy-wine     Current Outpatient Medications:  .  acetaminophen (TYLENOL) 500 MG tablet, Take 1,000-1,500 mg by mouth every 6 (six) hours as needed., Disp: , Rfl:  .  Cyanocobalamin (VITAMIN B-12 PO), Take 1 each by mouth daily., Disp: , Rfl:   No Known Allergies  Objective:   VITALS: Per patient if applicable, see vitals. GENERAL: Alert, appears well and in no acute distress. HEENT: Atraumatic, conjunctiva clear, no obvious abnormalities on inspection of external nose and ears. NECK: Normal movements of the head and neck. CARDIOPULMONARY: No increased WOB. Speaking in clear sentences. I:E ratio WNL.  MS: Moves all visible extremities without noticeable abnormality. PSYCH: Pleasant and cooperative, well-groomed. Speech normal rate and rhythm. Affect is appropriate. Insight and judgement are appropriate. Attention is focused, linear, and appropriate.  NEURO: CN grossly intact. Oriented as arrived to appointment on time with no prompting. Moves both UE equally.  SKIN: No obvious lesions, wounds, erythema, or cyanosis noted on face or hands.  Assessment and Plan:   Sarah Parks was seen today for left sided chest discomfort..  Diagnoses and all orders for this visit:  Constipation, unspecified constipation type Uncontrolled. Chronic. No red flags. Start miralax and colace daily over the weekend, follow-up Monday if no improvement or worsening symptoms. Discussed fiber, fluids and exercise.  Chest discomfort No red flags. Suspect possible pec strain. Start ibuprofen. Follow-up if no improvement. Urgent care or ER if symptoms worsen or significantly change.  . Reviewed expectations re: course of current medical issues. . Discussed self-management of symptoms. . Outlined signs and symptoms indicating need for more  acute intervention. . Patient verbalized understanding and all questions were answered. Marland Kitchen. Health Maintenance issues including appropriate healthy diet, exercise, and smoking avoidance were  discussed with patient. . See orders for this visit as documented in the electronic medical record.  I discussed the assessment and treatment plan with the patient. The patient was provided an opportunity to ask questions and all were answered. The patient agreed with the plan and demonstrated an understanding of the instructions.   The patient was advised to call back or seek an in-person evaluation if the symptoms worsen or if the condition fails to improve as anticipated.   CMA or LPN served as scribe during this visit. History, Physical, and Plan performed by medical provider. The above documentation has been reviewed and is accurate and complete.   MuldrowSamantha Hicks Feick, [place] 01/24/2019

## 2019-01-28 ENCOUNTER — Other Ambulatory Visit: Payer: Self-pay

## 2019-01-28 DIAGNOSIS — Z20822 Contact with and (suspected) exposure to covid-19: Secondary | ICD-10-CM

## 2019-01-28 NOTE — Telephone Encounter (Signed)
Spoke to pt asked her how her chest pain was? Pt said it is a little worse. She was moving things over the weekend. Pt also said she is going for COVID testing today. She said her children were all sick and there physician order the testing and parents are done also. Told her okay, when your test comes back please let us know. Also being as chest pain is worse need to go to Urgent Care or ED to be evaluated per [facility]. Pt verbalized understanding.

## 2019-01-30 ENCOUNTER — Ambulatory Visit (INDEPENDENT_AMBULATORY_CARE_PROVIDER_SITE_OTHER): Payer: 59

## 2019-01-30 ENCOUNTER — Other Ambulatory Visit: Payer: Self-pay

## 2019-01-30 ENCOUNTER — Ambulatory Visit (INDEPENDENT_AMBULATORY_CARE_PROVIDER_SITE_OTHER): Payer: 59 | Admitting: Physician Assistant

## 2019-01-30 ENCOUNTER — Encounter: Payer: Self-pay | Admitting: Physician Assistant

## 2019-01-30 VITALS — BP 120/82 | HR 88 | Temp 98.4°F | Ht 68.0 in | Wt 179.4 lb

## 2019-01-30 DIAGNOSIS — R0789 Other chest pain: Secondary | ICD-10-CM

## 2019-01-30 LAB — NOVEL CORONAVIRUS, NAA: SARS-CoV-2, NAA: NOT DETECTED

## 2019-01-30 IMAGING — DX CHEST - 2 VIEW
2 series · 2 of 2 positions shown · non-contrast
Comparison: [DATE]

CLINICAL DATA: Chest tightness

EXAM:
CHEST - 2 VIEW

[chest pa]
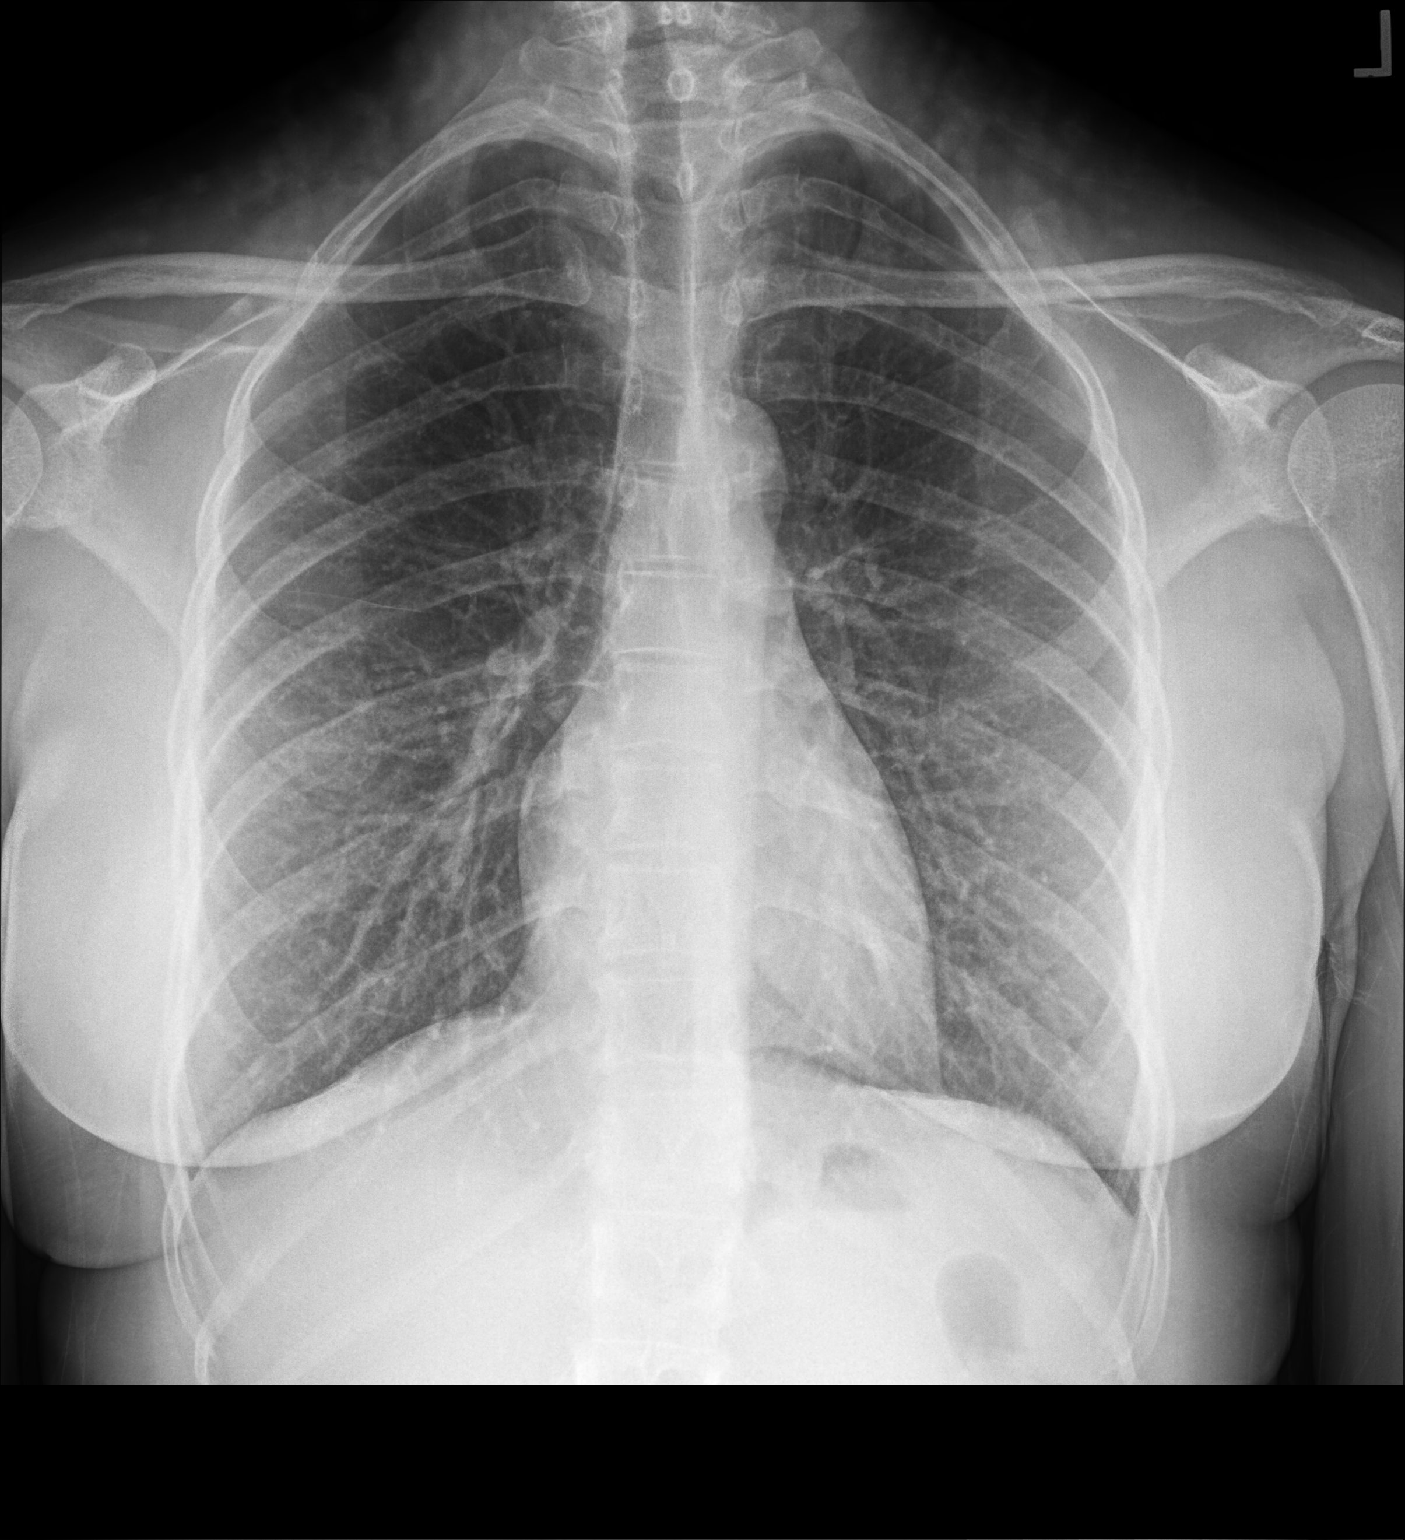

[chest lat]
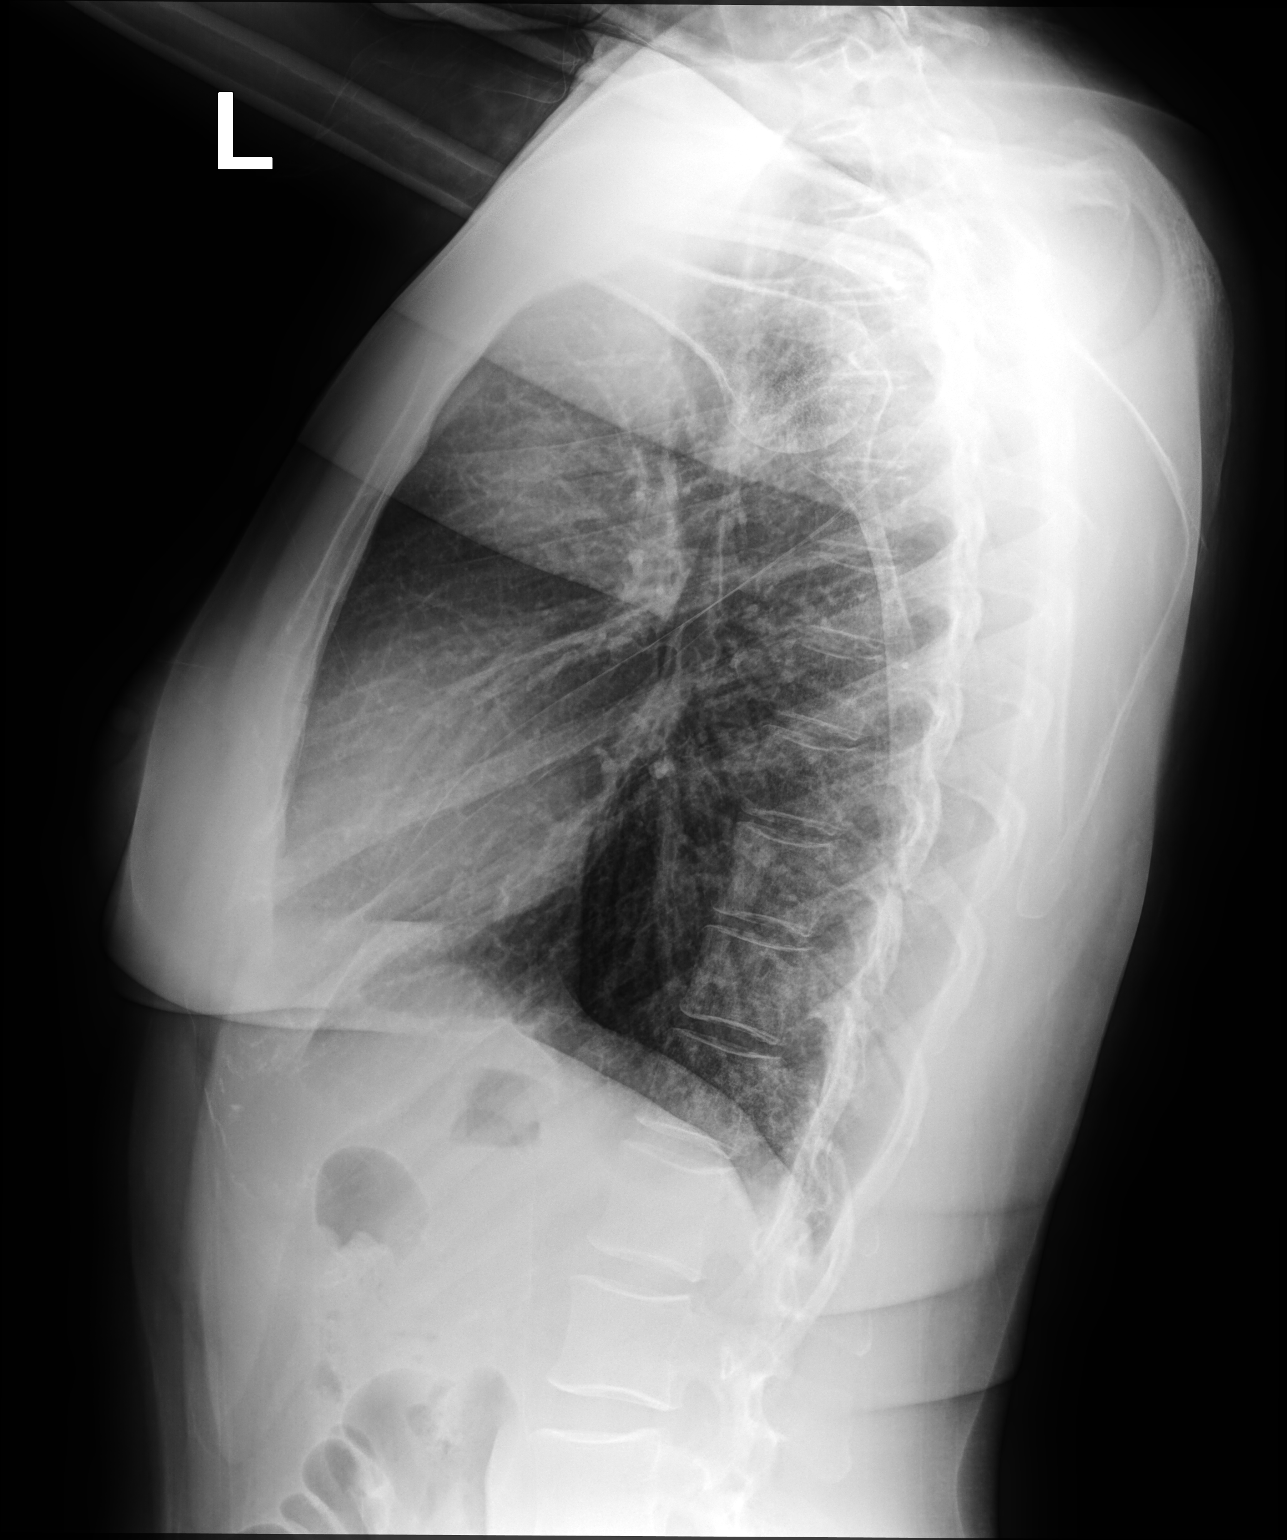

[2 of 2 positions shown; findings below may reference images not displayed]

FINDINGS: Lungs are clear. Heart size and pulmonary vascularity are normal. No
adenopathy. No bone lesions. No pneumothorax.
IMPRESSION: No edema or consolidation.

## 2019-01-30 MED ORDER — MELOXICAM 15 MG PO TABS: 15.0000 mg | ORAL_TABLET | Freq: Every day | ORAL | 0 refills | Status: DC

## 2019-01-30 NOTE — Patient Instructions (Signed)
It was great to see you!  Try the mobic, this is an anti-inflammatory. This will replace ibuprofen. May take daily.  If it doesn't go away in a week or so or you have any changes, please let me know.  I will be in touch when I get the official xray result from the radiologist.  Take care,  Inda Coke PA-C

## 2019-01-30 NOTE — Progress Notes (Signed)
Sarah Parks is a 38 y.o. female is here to follow up on muscle strain.  I acted as a Education administrator for [employer], PA-C [employer], LPN  History of Present Illness:   Chief Complaint  Patient presents with  . muscle strain    HPI   Muscle strain Pt still c/o muscle strain left side of chest x 2 weeks. She has discomfort when sitting up, lying down or side to side. Was taking Ibuprofen with no relief and tried Goody powder one night no relief. Had some SOB last night, has slight cough non-productive. Denies fever or chills. COVID-19 testing was Negative from 7/27.    There are no preventive care reminders to display for this patient.  Past Medical History:  Diagnosis Date  . Anemia   . Cholestasis   . SVD (spontaneous vaginal delivery)    x 2     Social History   Socioeconomic History  . Marital status: Married    Spouse name: Not on file  . Number of children: Not on file  . Years of education: Not on file  . Highest education level: Not on file  Occupational History  . Not on file  Social Needs  . Financial resource strain: Not on file  . Food insecurity    Worry: Not on file    Inability: Not on file  . Transportation needs    Medical: Not on file    Non-medical: Not on file  Tobacco Use  . Smoking status: Never Smoker  . Smokeless tobacco: Never Used  Substance and Sexual Activity  . Alcohol use: Yes    Comment: But none with pregnancy-wine  . Drug use: Not Currently    Types: Marijuana    Comment: Last use 11/22/2007  . Sexual activity: Yes    Birth control/protection: None    Comment: approx [redacted] wks gestation  Lifestyle  . Physical activity    Days per week: Not on file    Minutes per session: Not on file  . Stress: Not on file  Relationships  . Social Herbalist on phone: Not on file    Gets together: Not on file    Attends religious service: Not on file    Active member of club or organization: Not on file    Attends  meetings of clubs or organizations: Not on file    Relationship status: Not on file  . Intimate partner violence    Fear of current or ex partner: Not on file    Emotionally abused: Not on file    Physically abused: Not on file    Forced sexual activity: Not on file  Other Topics Concern  . Not on file  Social History Narrative  . Not on file    Past Surgical History:  Procedure Laterality Date  . BREAST BIOPSY Left    Fibroid tissue 2003  . DILATION AND EVACUATION N/A 05/14/2014   Procedure: DILATATION AND EVACUATION;  Surgeon: Cheri Fowler, MD;  Location: [facility];  Service: Gynecology;  Laterality: N/A;  . DILATION AND EVACUATION N/A 09/27/2017   Procedure: DILATATION AND EVACUATION;  Surgeon: Cheri Fowler, MD;  Location: [facility];  Service: Gynecology;  Laterality: N/A;  . INDUCED ABORTION    . LAPAROSCOPIC BILATERAL SALPINGECTOMY Bilateral 09/27/2017   Procedure: LAPAROSCOPIC BILATERAL SALPINGECTOMY;  Surgeon: Cheri Fowler, MD;  Location: [facility];  Service: Gynecology;  Laterality: Bilateral;  . WISDOM TOOTH EXTRACTION  Family History  Problem Relation Age of Onset  . Hypertension Brother   . Dementia Maternal Grandmother   . Cancer Maternal Grandfather   . Cancer Mother        breast  . Breast cancer Mother   . Colon cancer Neg Hx     PMHx, SurgHx, SocialHx, FamHx, Medications, and Allergies were reviewed in the Visit Navigator and updated as appropriate.   Patient Active Problem List   Diagnosis Date Noted  . Constipation 01/24/2019  . Vitamin B12 deficiency 04/27/2017  . Back pain 01/27/2017  . Family history of malignant neoplasm of breast 07/28/2016  . Cholestasis of pregnancy in third trimester 06/12/2015  . SVD (spontaneous vaginal delivery) 06/12/2015  . Missed abortion 05/14/2014    Social History   Tobacco Use  . Smoking status: Never Smoker  . Smokeless tobacco: Never Used  Substance Use Topics  . Alcohol use: Yes    Comment: But none  with pregnancy-wine  . Drug use: Not Currently    Types: Marijuana    Comment: Last use 11/22/2007    Current Medications and Allergies:    Current Outpatient Medications:  .  acetaminophen (TYLENOL) 500 MG tablet, Take 1,000-1,500 mg by mouth every 6 (six) hours as needed., Disp: , Rfl:  .  Cyanocobalamin (VITAMIN B-12 PO), Take 1 each by mouth daily., Disp: , Rfl:  .  meloxicam (MOBIC) 15 MG tablet, Take 1 tablet (15 mg total) by mouth daily., Disp: 30 tablet, Rfl: 0  No Known Allergies  Review of Systems   ROS Negative unless otherwise specified per HPI.  Vitals:   Vitals:   01/30/19 1334  BP: 120/82  Pulse: 88  Temp: 98.4 F (36.9 C)  TempSrc: Temporal  SpO2: 99%  Weight: 179 lb 6.1 oz (81.4 kg)  Height: 5\' 8"  (1.727 m)     Body mass index is 27.27 kg/m.   Physical Exam:    Physical Exam Vitals signs and nursing note reviewed.  Constitutional:      General: She is not in acute distress.    Appearance: She is well-developed. She is not ill-appearing or toxic-appearing.  Cardiovascular:     Rate and Rhythm: Normal rate and regular rhythm.     Pulses: Normal pulses.     Heart sounds: Normal heart sounds, S1 normal and S2 normal.     Comments: No LE edema Pulmonary:     Effort: Pulmonary effort is normal.     Breath sounds: Normal breath sounds.  Skin:    General: Skin is warm and dry.  Neurological:     Mental Status: She is alert.     GCS: GCS eye subscore is 4. GCS verbal subscore is 5. GCS motor subscore is 6.  Psychiatric:        Speech: Speech normal.        Behavior: Behavior normal. Behavior is cooperative.      Assessment and Plan:    Sarah Parks was seen today for muscle strain.  Diagnoses and all orders for this visit:  Chest tightness -     DG Chest 2 View; Future  Other orders -     meloxicam (MOBIC) 15 MG tablet; Take 1 tablet (15 mg total) by mouth daily.   Chest xray pending, no acute issues on my read. Possible muscle strain?  Will start mobic. If no improvement in 1-2 weeks, will send to sports med for possible u/s. Worsening precautions advised.  . Reviewed expectations re: course of current  medical issues. . Discussed self-management of symptoms. . Outlined signs and symptoms indicating need for more acute intervention. . Patient verbalized understanding and all questions were answered. . See orders for this visit as documented in the electronic medical record. . Patient received an After Visit Summary.  CMA or LPN served as scribe during this visit. History, Physical, and Plan performed by medical provider. The above documentation has been reviewed and is accurate and complete.   Jarold MottoSamantha Rhemi Balbach, PA-C [place] [place] 01/31/2019  Follow-up: No follow-ups on file.

## 2019-01-31 ENCOUNTER — Encounter: Payer: Self-pay | Admitting: Physician Assistant

## 2019-02-21 ENCOUNTER — Other Ambulatory Visit: Payer: Self-pay | Admitting: Physician Assistant

## 2019-04-17 ENCOUNTER — Other Ambulatory Visit: Payer: Self-pay

## 2019-04-17 ENCOUNTER — Encounter: Payer: Self-pay | Admitting: Family Medicine

## 2019-04-17 ENCOUNTER — Ambulatory Visit (INDEPENDENT_AMBULATORY_CARE_PROVIDER_SITE_OTHER): Payer: 59 | Admitting: Family Medicine

## 2019-04-17 VITALS — BP 108/74 | HR 81 | Temp 97.9°F | Ht 68.0 in | Wt 181.0 lb

## 2019-04-17 DIAGNOSIS — R102 Pelvic and perineal pain: Secondary | ICD-10-CM | POA: Diagnosis not present

## 2019-04-17 DIAGNOSIS — R319 Hematuria, unspecified: Secondary | ICD-10-CM

## 2019-04-17 LAB — COMPREHENSIVE METABOLIC PANEL
ALT: 16 U/L (ref 0–35)
AST: 13 U/L (ref 0–37)
Albumin: 4.2 g/dL (ref 3.5–5.2)
Alkaline Phosphatase: 57 U/L (ref 39–117)
BUN: 10 mg/dL (ref 6–23)
CO2: 25 mEq/L (ref 19–32)
Calcium: 9.3 mg/dL (ref 8.4–10.5)
Chloride: 104 mEq/L (ref 96–112)
Creatinine, Ser: 0.89 mg/dL (ref 0.40–1.20)
GFR: 86.41 mL/min (ref >60.00)
Glucose, Bld: 81 mg/dL (ref 70–99)
Potassium: 4.2 mEq/L (ref 3.5–5.1)
Sodium: 137 mEq/L (ref 135–145)
Total Bilirubin: 0.6 mg/dL (ref 0.2–1.2)
Total Protein: 7.2 g/dL (ref 6.0–8.3)

## 2019-04-17 LAB — POC URINALSYSI DIPSTICK (AUTOMATED)
Bilirubin, UA: NEGATIVE
Blood, UA: POSITIVE
Glucose, UA: NEGATIVE
Ketones, UA: NEGATIVE
Leukocytes, UA: NEGATIVE
Nitrite, UA: NEGATIVE
Protein, UA: NEGATIVE
Spec Grav, UA: 1.025 (ref 1.010–1.025)
Urobilinogen, UA: 0.2 E.U./dL
pH, UA: 6 (ref 5.0–8.0)

## 2019-04-17 LAB — CBC
HCT: 41.5 % (ref 36.0–46.0)
Hemoglobin: 14.2 g/dL (ref 12.0–15.0)
MCHC: 34.2 g/dL (ref 30.0–36.0)
MCV: 97.8 fl (ref 78.0–100.0)
Platelets: 293 10*3/uL (ref 150.0–400.0)
RBC: 4.24 Mil/uL (ref 3.87–5.11)
RDW: 12.9 % (ref 11.5–15.5)
WBC: 8.1 10*3/uL (ref 4.0–10.5)

## 2019-04-17 LAB — TSH: TSH: 1.01 u[IU]/mL (ref 0.35–4.50)

## 2019-04-17 LAB — POCT URINE PREGNANCY: Preg Test, Ur: NEGATIVE

## 2019-04-17 NOTE — Progress Notes (Signed)
Chief Complaint:  Sarah Parks is a 38 y.o. female who presents for same day appointment with a chief complaint of pelvic pain.   Assessment/Plan:  Pelvic pain/hematuria Concern for possible endometriosis.  Given her hematuria, we will check CT abdomen and pelvis.  Check CBC, C met, and TSH today.  Recommended 600-800 mg of ibuprofen as needed for severe pain.  She does not wish to start hormonal contraceptive at this point.  Depending on results of CT, will likely need further evaluation by OB/GYN.  Discussed reasons return to care.     Subjective:  HPI:  Pelvic Pain, acute problem Started about a year ago after having D&E after having a miscarriage. Since then, she has had recurrent RLQ abdominal and pelvic pain.  Typically occurs 1 to 2 weeks before her cycle.  Pain is described as a burning sensation.  This occurs normally.  Sometimes can be very severe and wake her up from sleep.  No dysuria.  No fevers or chills.  No nausea or vomiting.  No changes in bowel habits.  No hematuria.  Occasionally has dyspareunia just before her cycle.  No specific treatments tried.  Symptoms have worsened over last week.  No other obvious aggravating or alleviating factors.   ROS: Per HPI  PMH: She reports that she has never smoked. She has never used smokeless tobacco. She reports current alcohol use. She reports previous drug use. Drug: Marijuana.      Objective:  Physical Exam: BP 108/74   Pulse 81   Temp 97.9 F (36.6 C)   Ht 5' 8" (1.727 m)   Wt 181 lb (82.1 kg)   LMP 03/25/2019   SpO2 99%   BMI 27.52 kg/m   Gen: NAD, resting comfortably CV: Regular rate and rhythm with no murmurs appreciated Pulm: Normal work of breathing, clear to auscultation bilaterally with no crackles, wheezes, or rhonchi GI: Normal bowel sounds present. Soft, Nontender, Nondistended. MSK: No edema, cyanosis, or clubbing noted Skin: Warm, dry Neuro: Grossly normal, moves all extremities Psych: Normal  affect and thought content  Results for orders placed or performed in visit on 04/17/19 (from the past 24 hour(s))  POCT Urinalysis Dipstick (Automated)     Status: None   Collection Time: 04/17/19  8:11 AM  Result Value Ref Range   Color, UA Yellow    Clarity, UA Clear    Glucose, UA Negative Negative   Bilirubin, UA Negative    Ketones, UA Negative    Spec Grav, UA 1.025 1.010 - 1.025   Blood, UA Positive    pH, UA 6.0 5.0 - 8.0   Protein, UA Negative Negative   Urobilinogen, UA 0.2 0.2 or 1.0 E.U./dL   Nitrite, UA Negative    Leukocytes, UA Negative Negative  POCT urine pregnancy     Status: None   Collection Time: 04/17/19  8:13 AM  Result Value Ref Range   Preg Test, Ur Negative Negative        Caleb M. Parker, MD 04/17/2019 8:38 AM  

## 2019-04-17 NOTE — Patient Instructions (Signed)
It was very nice to see Sarah Parks today!  I think Sarah Parks may have endometriosis. We will get blood work and and a CT scan to further evaluate.  Sarah Parks can take 600-800mg  of ibuprofen as needed every 8 hours to help with your pain.  Take care, Dr Jerline Pain   Endometriosis  Endometriosis is a condition in which the tissue that lines the uterus (endometrium) grows outside of its normal location. The tissue may grow in many locations close to the uterus, but it commonly grows on the ovaries, fallopian tubes, vagina, or bowel. When the uterus sheds the endometrium every menstrual cycle, there is bleeding wherever the endometrial tissue is located. This can cause pain because blood is irritating to tissues that are not normally exposed to it. What are the causes? The cause of endometriosis is not known. What increases the risk? Sarah Parks may be more likely to develop endometriosis if Sarah Parks:  Have a family history of endometriosis.  Have never given birth.  Started your period at age 69 or younger.  Have high levels of estrogen in your body.  Were exposed to a certain medicine (diethylstilbestrol) before Sarah Parks were born (in utero).  Had low birth weight.  Were born as a twin, triplet, or other multiple.  Have a BMI of less than 25. BMI is an estimate of body fat and is calculated from height and weight. What are the signs or symptoms? Often, there are no symptoms of this condition. If you do have symptoms, they may:  Vary depending on where your endometrial tissue is growing.  Occur during your menstrual period (most common) or midcycle.  Come and go, or Sarah Parks may go months with no symptoms at all.  Stop with menopause. Symptoms may include:  Pain in the back or abdomen.  Heavier bleeding during periods.  Pain during sex.  Painful bowel movements.  Infertility.  Pelvic pain.  Bleeding more than once a month. How is this diagnosed? This condition is diagnosed based on your symptoms and a  physical exam. Sarah Parks may have tests, such as:  Blood tests and urine tests. These may be done to help rule out other possible causes of your symptoms.  Ultrasound, to look for abnormal tissues.  An X-ray of the lower bowel (barium enema).  An ultrasound that is done through the vagina (transvaginally).  CT scan.  MRI.  Laparoscopy. In this procedure, a lighted, pencil-sized instrument called a laparoscope is inserted into your abdomen through an incision. The laparoscope allows your health care provider to look at the organs inside your body and check for abnormal tissue to confirm the diagnosis. If abnormal tissue is found, your health care provider may remove a small piece of tissue (biopsy) to be examined under a microscope. How is this treated? Treatment for this condition may include:  Medicines to relieve pain, such as NSAIDs.  Hormone therapy. This involves using artificial (synthetic) hormones to reduce endometrial tissue growth. Your health care provider may recommend using a hormonal form of birth control, or other medicines.  Surgery. This may be done to remove abnormal endometrial tissue. ? In some cases, tissue may be removed using a laparoscope and a laser (laparoscopic laser treatment). ? In severe cases, surgery may be done to remove the fallopian tubes, uterus, and ovaries (hysterectomy). Follow these instructions at home:  Take over-the-counter and prescription medicines only as told by your health care provider.  Do not drive or use heavy machinery while taking prescription pain medicine.  Try to  avoid activities that cause pain, including sexual activity.  Keep all follow-up visits as told by your health care provider. This is important. Contact a health care provider if:  Sarah Parks have pain in the area between your hip bones (pelvic area) that occurs: ? Before, during, or after your period. ? In between your period and gets worse during your period. ? During or  after sex. ? With bowel movements or urination, especially during your period.  Sarah Parks have problems getting pregnant.  Sarah Parks have a fever. Get help right away if:  Sarah Parks have severe pain that does not get better with medicine.  Sarah Parks have severe nausea and vomiting, or Sarah Parks cannot eat without vomiting.  Sarah Parks have pain that affects only the lower, right side of your abdomen.  Sarah Parks have abdominal pain that gets worse.  Sarah Parks have abdominal swelling.  Sarah Parks have blood in your stool. This information is not intended to replace advice given to Sarah Parks by your health care provider. Make sure Sarah Parks discuss any questions Sarah Parks have with your health care provider. Document Released: 06/17/2000 Document Revised: 06/02/2017 Document Reviewed: 11/21/2015 Elsevier Patient Education  2020 [employer].

## 2019-04-18 NOTE — Progress Notes (Signed)
Dr Marigene Ehlers interpretation of your lab work:  Your blood work is all NORMAL. We will contact you with the results of the CT scan once they are available.    If you have any additional questions, please give [place] a call or send [place] a message through [place].  Take care, Dr Jerline Pain

## 2019-04-19 ENCOUNTER — Telehealth: Payer: Self-pay

## 2019-04-19 ENCOUNTER — Ambulatory Visit
Admission: RE | Admit: 2019-04-19 | Discharge: 2019-04-19 | Disposition: A | Discharge status: Home / Self Care | Payer: 59 | Source: Ambulatory Visit | Attending: Family Medicine | Admitting: Family Medicine

## 2019-04-19 DIAGNOSIS — R102 Pelvic and perineal pain: Secondary | ICD-10-CM

## 2019-04-19 DIAGNOSIS — R319 Hematuria, unspecified: Secondary | ICD-10-CM

## 2019-04-19 IMAGING — CT CT ABD-PELV W/ CM
2 of 4 series · 12 of 46 positions shown, 14 images · IV contrast (iopamidol)
Comparison: Pelvic sonogram of [DATE]

CLINICAL DATA: Hematuria, unknown cause. Pelvic pain since early
this year.

EXAM:
CT ABDOMEN AND PELVIS WITH CONTRAST
TECHNIQUE: Multidetector CT imaging of the abdomen and pelvis was performed
using the standard protocol following bolus administration of
intravenous contrast.
CONTRAST:  100mL [V9] IOPAMIDOL ([V9]) INJECTION 61%

[Series 2: abd pelvis 5.00 br40 s3 axial · axial · 0.55mm/px · z∈[+1268,+1668]mm · 9 of 96 slices shown, 11 images]
[im 8/96  soft-tissue]
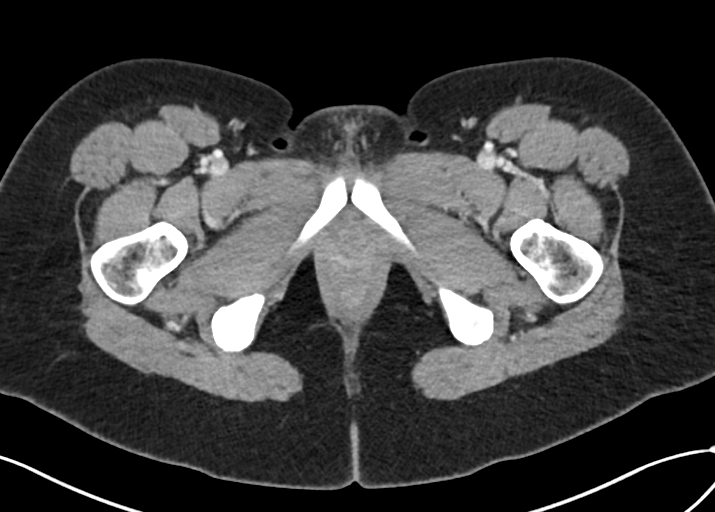
[im 8/96  bone]
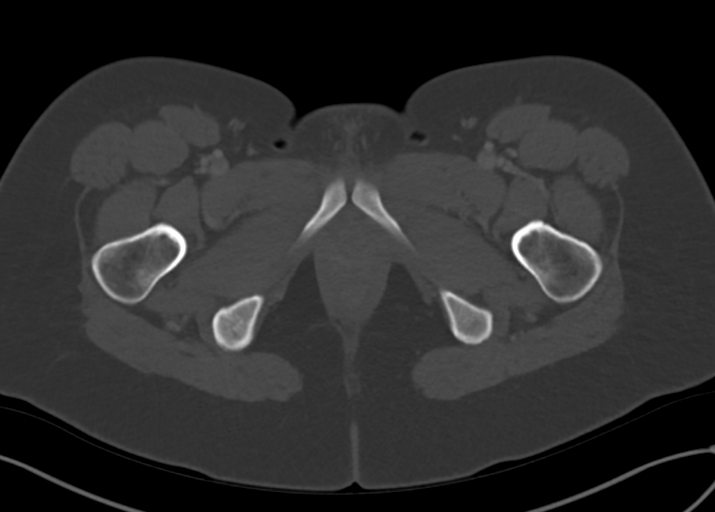
[im 16/96  soft-tissue]
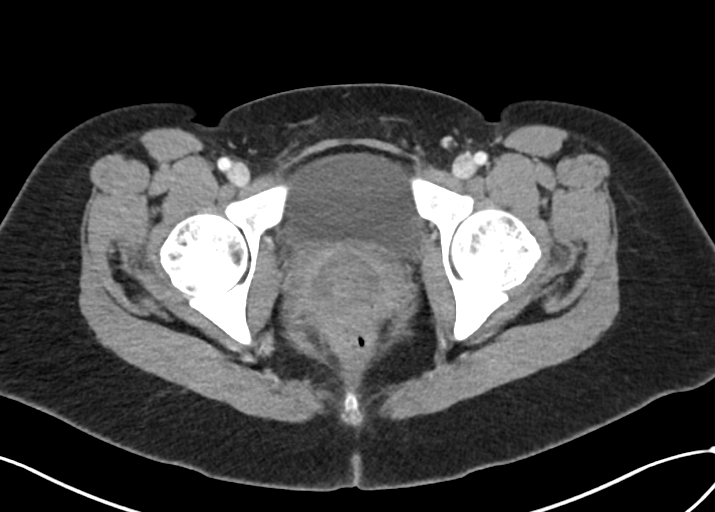
[im 28/96  soft-tissue]
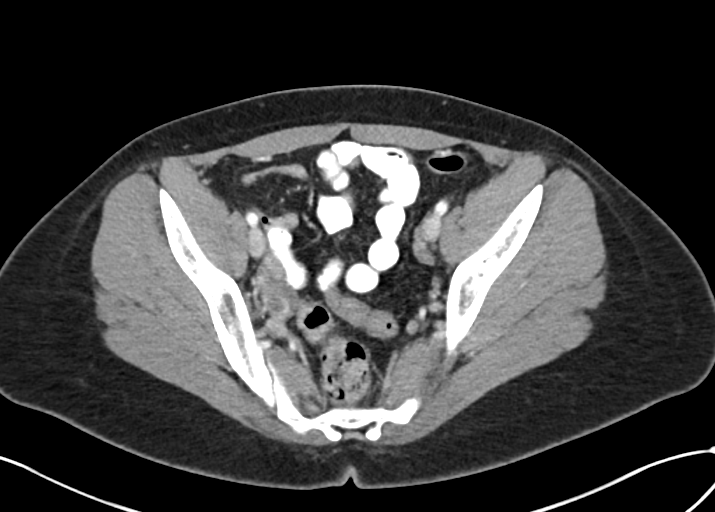
[im 36/96  soft-tissue]
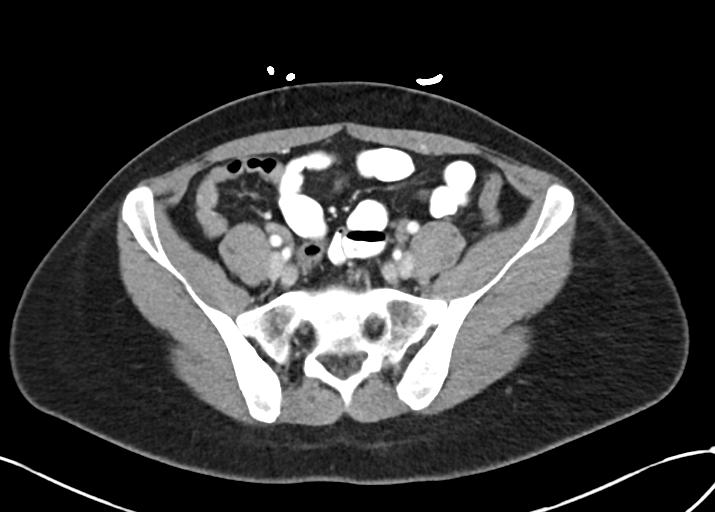
[im 48/96  soft-tissue]
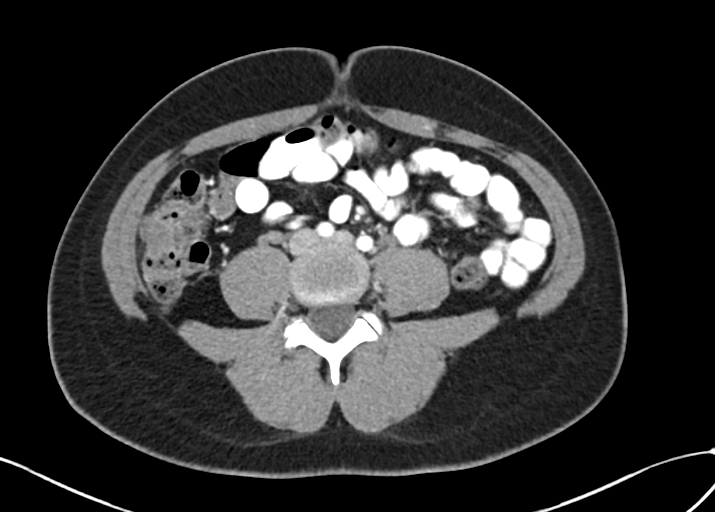
[im 60/96  soft-tissue]
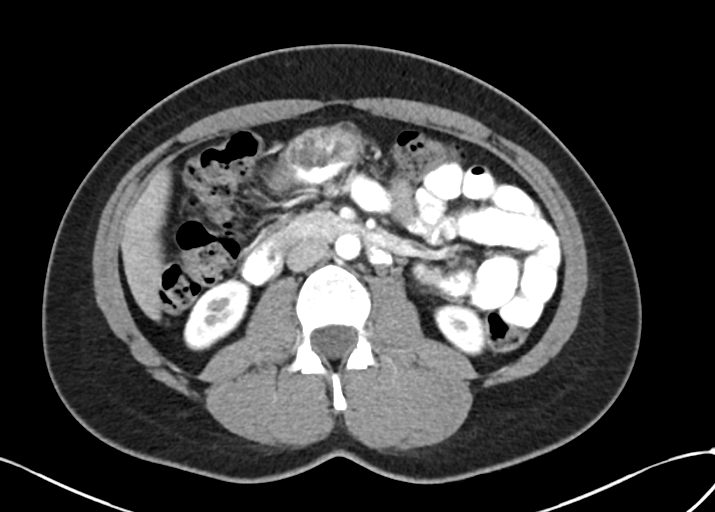
[im 68/96  soft-tissue]
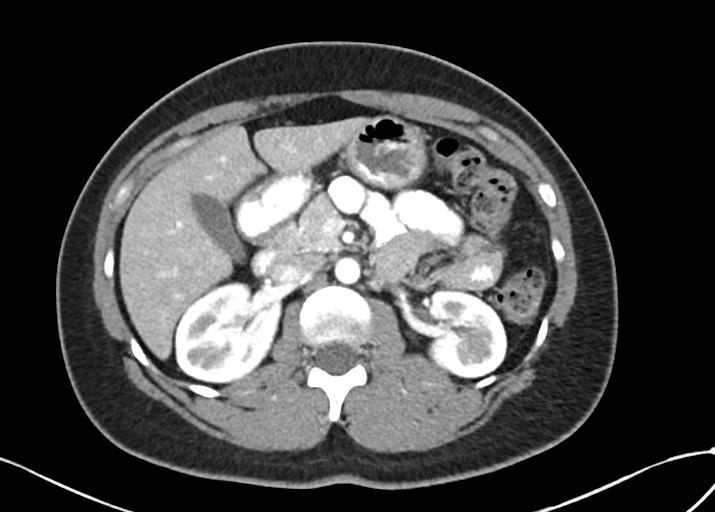
[im 80/96  soft-tissue]
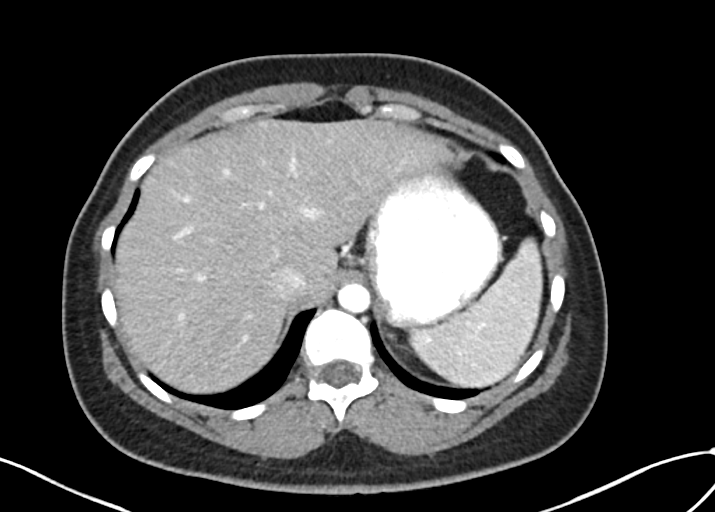
[im 88/96  soft-tissue]
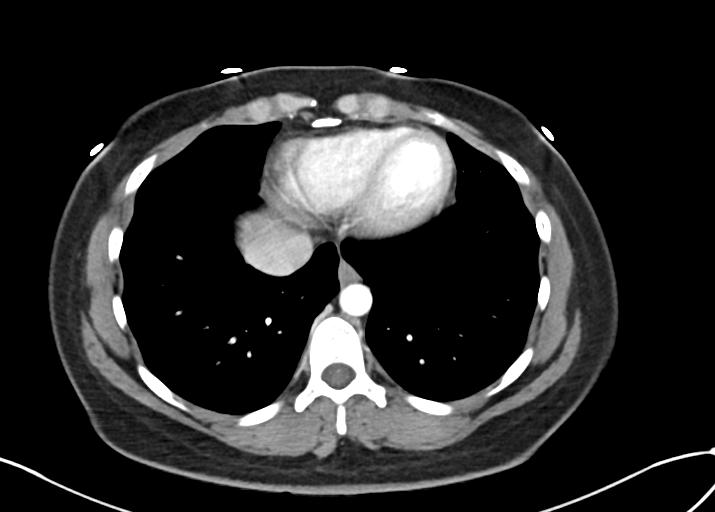
[im 88/96  bone]
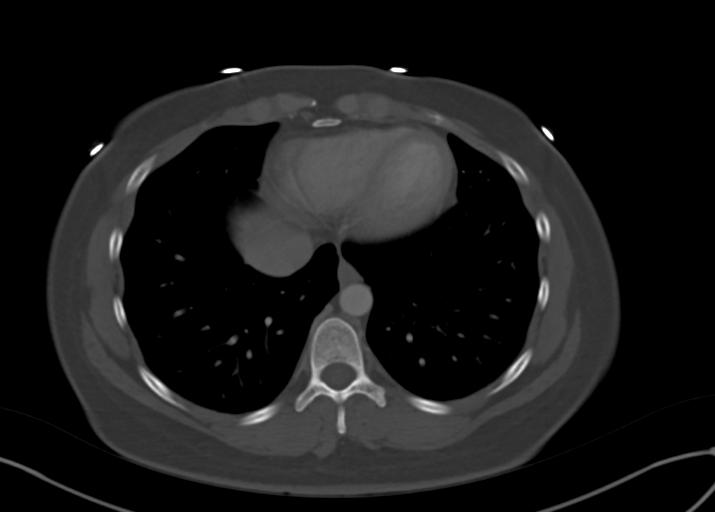

[Series 6: abd pelvis 2.00 br40 s3 cor · coronal · 0.77mm/px · 3 of 127 slices shown]
[im 43/127  soft-tissue]
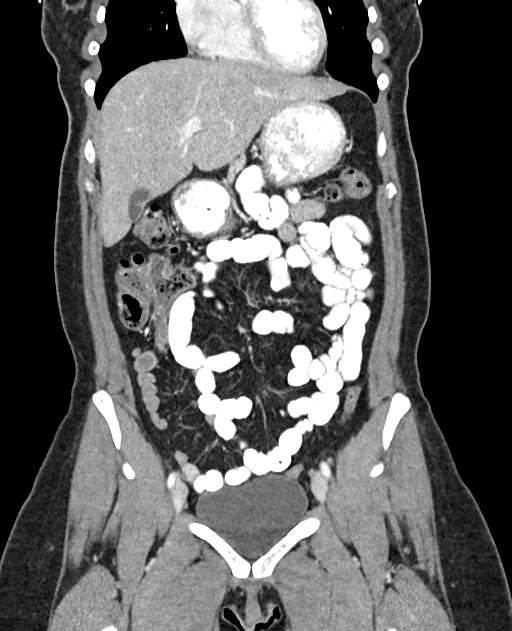
[im 57/127  soft-tissue]
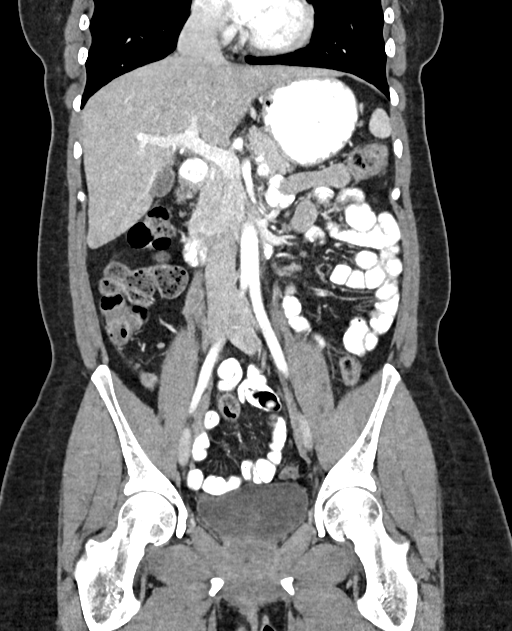
[im 71/127  soft-tissue]
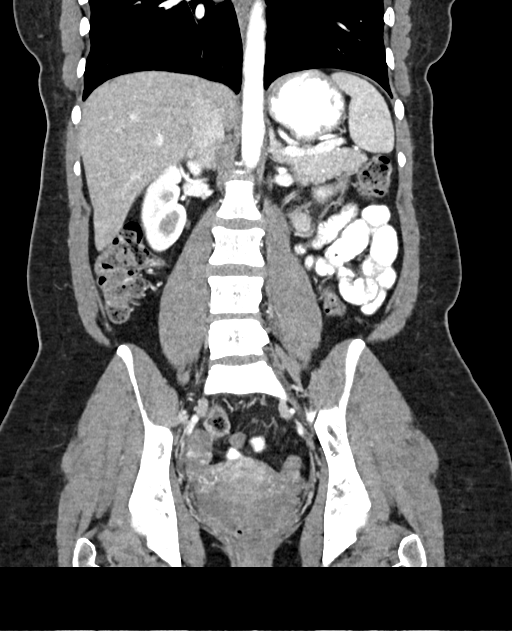

[12 of 46 positions shown; findings below may reference images not displayed]

FINDINGS: Lower chest: No signs of effusion or consolidation.

Hepatobiliary: Probable background hepatic steatosis with focal
fatty intensive occasion along interloop lobar fissure.

Biliary tree is unremarkable.

Pancreas: Unremarkable. No pancreatic ductal dilatation or
surrounding inflammatory changes.

Spleen: Normal in size without focal abnormality.

Adrenals/Urinary Tract: Adrenal glands are normal.

Kidneys enhance symmetrically without focal lesion or
hydronephrosis.

Stomach/Bowel: No signs of acute bowel process. Normal appendix.
Stool fills much of the colon.

Vascular/Lymphatic: Patent abdominal vasculature.

No signs of upper abdominal or retroperitoneal adenopathy.

No signs of pelvic lymphadenopathy.

Reproductive: Signs of corpus luteum cyst on the right. Retroflexed
uterus, unremarkable by CT.

Other: Small umbilical hernia contains only fat. No signs of free
air. No signs of focal fluid collection.

Musculoskeletal: No acute or destructive bone process.
IMPRESSION: 1. No cause for the patient's symptoms identified.
2. Signs of corpus luteum cyst on the right.

## 2019-04-19 MED ORDER — IOPAMIDOL (ISOVUE-300) INJECTION 61%
100.0000 mL | Freq: Once | INTRAVENOUS | Status: AC | PRN
  Administered 2019-04-19: 100 mL via INTRAVENOUS

## 2019-04-19 NOTE — Progress Notes (Signed)
Please inform patient of the following:  Patient had a small cyst on her right ovary - this could explain some her pain, but this would not explain the blood in her urine.  This usually will improve with birth control pills - recommend that she follow up her or with her OBGYN to discuss starting if she wishes.   I recommend a urological referral to look for other causes for the blood in her urine. Please place referral.  Caleb M. Jerline Pain, MD 04/19/2019 2:41 PM

## 2019-04-19 NOTE — Telephone Encounter (Signed)
Copied from [place] [id number]. Topic: General - Inquiry >> Apr 19, 2019  4:30 PM Virl Axe D wrote: Reason for CRM: Pt stated that she did not fully understand her CT results that were explained to her. She would like to know if Dr. Jerline Pain or Aldona Bar could contact her to go the results so she has a better understanding. Please advise.

## 2019-04-21 ENCOUNTER — Encounter: Payer: Self-pay | Admitting: Family Medicine

## 2019-04-22 ENCOUNTER — Other Ambulatory Visit: Payer: Self-pay

## 2019-04-22 DIAGNOSIS — R319 Hematuria, unspecified: Secondary | ICD-10-CM

## 2019-04-22 NOTE — Telephone Encounter (Signed)
I have contacted the patient via [place] and answered her questions.

## 2019-04-22 NOTE — Telephone Encounter (Signed)
Please advise 

## 2019-04-22 NOTE — Telephone Encounter (Signed)
Recommend she schedule follow up visit to discuss.  Algis Greenhouse. Jerline Pain, MD 04/22/2019 12:11 PM

## 2019-05-03 ENCOUNTER — Encounter: Payer: Self-pay | Admitting: Physician Assistant

## 2019-05-03 ENCOUNTER — Telehealth: Payer: Self-pay | Admitting: *Deleted

## 2019-05-03 ENCOUNTER — Ambulatory Visit (INDEPENDENT_AMBULATORY_CARE_PROVIDER_SITE_OTHER): Payer: 59 | Admitting: Physician Assistant

## 2019-05-03 VITALS — Ht 68.0 in | Wt 179.0 lb

## 2019-05-03 DIAGNOSIS — R102 Pelvic and perineal pain: Secondary | ICD-10-CM

## 2019-05-03 NOTE — Progress Notes (Signed)
   TELEPHONE ENCOUNTER   Patient verbally agreed to telephone visit and is aware that copayment and coinsurance may apply. Patient was treated using telemedicine according to accepted telemedicine protocols.  Location of the patient: home Location of provider: [place] Names of all persons participating in the telemedicine service and role in the encounter: Inda Coke, [place] , Donne Anon  Subjective:   Chief Complaint  Patient presents with  . RLQ pain     HPI   RLQ pain Pt was diagnosed with ovarian cyst right ovary on 10/14, has pain off and on. Pt would like to discuss options to control pain.  She was being seen by Dr. Cheri Fowler, but she states that she is still paying bills off from her surgery and is unable to return to their office at this time.  Denies: fever, chills, poor appetite, severe worsening of pain, pain with walking  Pain radiates down leg at times. Has persistent hematuria and has urology consult pending.  She takes ibuprofen occasionally for her cyst and it helps but pain always returns.    Patient Active Problem List   Diagnosis Date Noted  . Constipation 01/24/2019  . Vitamin B12 deficiency 04/27/2017  . Back pain 01/27/2017  . Family history of malignant neoplasm of breast 07/28/2016  . Cholestasis of pregnancy in third trimester 06/12/2015  . SVD (spontaneous vaginal delivery) 06/12/2015  . Missed abortion 05/14/2014   Social History   Tobacco Use  . Smoking status: Never Smoker  . Smokeless tobacco: Never Used  Substance Use Topics  . Alcohol use: Yes    Comment: But none with pregnancy-wine    Current Outpatient Medications:  .  Cyanocobalamin (VITAMIN B-12 PO), Take 1 each by mouth daily., Disp: , Rfl:  No Known Allergies  Assessment & Plan:   1. Pelvic pain   Referral to Dr. Sumner Boast for further evaluation and treatment. Worsening precautions have been advised in the interim.  No orders of the defined  types were placed in this encounter.  No orders of the defined types were placed in this encounter.   Inda Coke, [place] 05/03/2019  Time spent with the patient: 8 minutes, spent in obtaining information about her symptoms, reviewing her previous labs, evaluations, and treatments, counseling her about her condition (please see the discussed topics above), and developing a plan to further investigate it; she had a number of questions which I addressed.

## 2019-05-03 NOTE — Telephone Encounter (Signed)
Spoke to pt told her Aldona Bar said she discussed with Dr. Jerline Pain and plan is to continue to pursue urology appt and will refer to new GYN. Told her Aldona Bar did put the referral in for GYN and someone will be contacting you to schedule an appt. Pt verbalized understanding.

## 2019-05-06 ENCOUNTER — Other Ambulatory Visit: Payer: Self-pay

## 2019-05-08 ENCOUNTER — Other Ambulatory Visit: Payer: Self-pay

## 2019-05-09 ENCOUNTER — Ambulatory Visit (INDEPENDENT_AMBULATORY_CARE_PROVIDER_SITE_OTHER): Payer: 59 | Admitting: Obstetrics and Gynecology

## 2019-05-09 ENCOUNTER — Encounter: Payer: Self-pay | Admitting: Obstetrics and Gynecology

## 2019-05-09 ENCOUNTER — Encounter: Payer: Self-pay | Admitting: Nurse Practitioner

## 2019-05-09 VITALS — BP 110/70 | HR 147 | Temp 97.4°F | Ht 68.5 in | Wt 180.0 lb

## 2019-05-09 DIAGNOSIS — N949 Unspecified condition associated with female genital organs and menstrual cycle: Secondary | ICD-10-CM

## 2019-05-09 DIAGNOSIS — R109 Unspecified abdominal pain: Secondary | ICD-10-CM | POA: Diagnosis not present

## 2019-05-09 DIAGNOSIS — K5900 Constipation, unspecified: Secondary | ICD-10-CM

## 2019-05-09 DIAGNOSIS — N943 Premenstrual tension syndrome: Secondary | ICD-10-CM

## 2019-05-09 DIAGNOSIS — N946 Dysmenorrhea, unspecified: Secondary | ICD-10-CM

## 2019-05-09 DIAGNOSIS — R102 Pelvic and perineal pain: Secondary | ICD-10-CM

## 2019-05-09 DIAGNOSIS — R4586 Emotional lability: Secondary | ICD-10-CM | POA: Diagnosis not present

## 2019-05-09 MED ORDER — DOXYCYCLINE HYCLATE 100 MG PO CAPS: 100.0000 mg | ORAL_CAPSULE | Freq: Two times a day (BID) | ORAL | 0 refills | Status: DC

## 2019-05-09 NOTE — Patient Instructions (Addendum)
Premenstrual Syndrome Premenstrual syndrome (PMS) is a group of physical, emotional, and behavioral symptoms that affect women of childbearing age as part of their menstrual cycle. PMS starts 1-2 weeks before the start of a woman's menstrual period and goes away a few days after menstrual bleeding starts. It often happens in a predictable pattern (recurs). PMS may cause other health conditions to become worse, such as asthma, allergies, and migraines. PMS can range from mild to severe. When it is severe, it is called premenstrual dysphoric disorder (PMDD). PMS may interfere with normal daily activities. What are the causes? The cause of this condition is not known, but it seems to be related to hormone changes that happen before menstruation. What are the signs or symptoms? Symptoms of this condition often happen every month. They go away completely after your period starts. Physical symptoms of this condition include:  Bloating.  Breast pain.  Headaches.  Extreme fatigue.  Backaches.  Swelling of the hands and feet.  Weight gain.  Hot flashes. Emotional and behavioral symptoms of this condition include:  Mood swings.  Depression.  Angry outbursts.  Irritability.  Anxiety.  Crying spells.  Food cravings or appetite changes.  Changes in sexual desire.  Confusion.  Aggression.  Social withdrawal.  Poor concentration. How is this diagnosed? This condition may be diagnosed based on a history of your symptoms. This condition is generally diagnosed if symptoms of PMS:  Are present in the 5 days before your period starts.  End within 4 days after your period starts.  Happen at least 3 months in a row.  Interfere with some of your normal activities. Other conditions that can cause some of these symptoms must be ruled out before PMS can be diagnosed. These include depression, anxiety, anemia, and thyroid problems. How is this treated? This condition may be treated  by:  Maintaining a healthy lifestyle. This includes eating a well-balanced diet and exercising regularly.  Taking medicines. Medicines can help relieve symptoms such as cramps, aches, pains, headaches, and breast tenderness. Depending on the severity of the condition, your health care provider may recommend various over-the-counter pain medicines. Follow these instructions at home: Eating and drinking   Eat a well-balanced diet.  Avoid caffeine and alcohol.  Limit the amount of salt and salty foods you eat. This will help reduce bloating.  Drink enough fluid to keep your urine pale yellow.  Take a multivitamin if told to do so by your health care provider. Lifestyle   Do not use any products that contain nicotine or tobacco, such as cigarettes, e-cigarettes, and chewing tobacco. If you need help quitting, ask your health care provider.  Exercise regularly as suggested by your health care provider.  Get enough sleep. For most adults, this is 7-8 hours of sleep each night.  Practice relaxation techniques such as yoga, tai chi, or meditation.  Find healthy ways to manage stress. General instructions   For 2-3 months, write down your symptoms, their severity, and how long they last. This will help your health care provider choose the best treatment for you.  Take over-the-counter and prescription medicines only as told by your health care provider.  If you are using birth control pills (oral contraceptives), use them as told by your health care provider. Contact a health care provider if:  Your symptoms get worse.  You develop new symptoms.  You have trouble doing your daily activities. Summary  Premenstrual syndrome (PMS) is a group of physical, emotional, and behavioral symptoms that   affect women of childbearing age.  PMS starts 1-2 weeks before the start of a woman's period and goes away a few days after the period starts.  PMS is treated by maintaining a healthy  lifestyle and taking medicines to relieve the symptoms. This information is not intended to replace advice given to you by your health care provider. Make sure you discuss any questions you have with your health care provider. Document Released: 06/17/2000 Document Revised: 01/31/2018 Document Reviewed: 01/31/2018 Elsevier Patient Education  2020 [employer]. About Constipation  Constipation Overview Constipation is the most common gastrointestinal complaint - about 4 million Americans experience constipation and make 2.5 million physician visits a year to get help for the problem.  Constipation can occur when the colon absorbs too much water, the colon's muscle contraction is slow or sluggish, and/or there is delayed transit time through the colon.  The result is stool that is hard and dry.  Indicators of constipation include straining during bowel movements greater than 25% of the time, having fewer than three bowel movements per week, and/or the feeling of incomplete evacuation.  There are established guidelines ([place] II ) for defining constipation. A person needs to have two or more of the following symptoms for at least 12 weeks (not necessarily consecutive) in the preceding 12 months: . Straining in  greater than 25% of bowel movements . Lumpy or hard stools in greater than 25% of bowel movements . Sensation of incomplete emptying in greater than 25% of bowel movements . Sensation of anorectal obstruction/blockade in greater than 25% of bowel movements . Manual maneuvers to help empty greater than 25% of bowel movements (e.g., digital evacuation, support of the pelvic floor)  . Less than  3 bowel movements/week . Loose stools are not present, and criteria for irritable bowel syndrome are insufficient  Common Causes of Constipation . Lack of fiber in your diet . Lack of physical activity . Medications, including iron and calcium supplements  . Dairy intake . Dehydration . Abuse of  laxatives  Travel  Irritable Bowel Syndrome  Pregnancy  Luteal phase of menstruation (after ovulation and before menses)  Colorectal problems  Intestinal Dysfunction  Treating Constipation  There are several ways of treating constipation, including changes to diet and exercise, use of laxatives, adjustments to the pelvic floor, and scheduled toileting.  These treatments include: . increasing fiber and fluids in the diet  . increasing physical activity . learning muscle coordination   learning proper toileting techniques and toileting modifications   designing and sticking  to a toileting schedule     2007, [employer] Doc.22Pelvic Pain, Female Pelvic pain is pain in your lower abdomen, below your belly button and between your hips. The pain may start suddenly (be acute), keep coming back (be recurring), or last a long time (become chronic). Pelvic pain that lasts longer than 6 months is considered chronic. Pelvic pain may affect your:  Reproductive organs.  Urinary system.  Digestive tract.  Musculoskeletal system. There are many potential causes of pelvic pain. Sometimes, the pain can be a result of digestive or urinary conditions, strained muscles or ligaments, or reproductive conditions. Sometimes the cause of pelvic pain is not known. Follow these instructions at home:   Take over-the-counter and prescription medicines only as told by your health care provider.  Rest as told by your health care provider.  Do not have sex if it hurts.  Keep a journal of your pelvic pain. Write down: ? When the pain  started. ? Where the pain is located. ? What seems to make the pain better or worse, such as food or your period (menstrual cycle). ? Any symptoms you have along with the pain.  Keep all follow-up visits as told by your health care provider. This is important. Contact a health care provider if:  Medicine does not help your pain.  Your pain comes  back.  You have new symptoms.  You have abnormal vaginal discharge or bleeding, including bleeding after menopause.  You have a fever or chills.  You are constipated.  You have blood in your urine or stool.  You have foul-smelling urine.  You feel weak or light-headed. Get help right away if:  You have sudden severe pain.  Your pain gets steadily worse.  You have severe pain along with fever, nausea, vomiting, or excessive sweating.  You lose consciousness. Summary  Pelvic pain is pain in your lower abdomen, below your belly button and between your hips.  There are many potential causes of pelvic pain.  Keep a journal of your pelvic pain. This information is not intended to replace advice given to you by your health care provider. Make sure you discuss any questions you have with your health care provider. Document Released: 05/17/2004 Document Revised: 12/06/2017 Document Reviewed: 12/06/2017 Elsevier Patient Education  2020 [employer].

## 2019-05-09 NOTE — Progress Notes (Signed)
38 y.o. [id number] Married Black or African American Not Hispanic or Latino female here for a consultation from [place], [place] for pelvic pain and an ovarian cyst.    The patient had a CT on 04/19/19 for hematuria and pelvic pain. CT showed signs of a corpus luteum cyst on the right. No abnormalities were noted.     Her pain started earlier this year in the RLQ. Initially the pain was daily, quick sharp/burning pain, lasts for a couple of seconds. The pain is up to a 7/10 in severity, has woken her up from her sleep. Occasionally the pain is more dull. She can get the pain multiple times a day. It got better for a couple of months where it wasn't coming as frequently, but in the last 2 months it has returned to daily.  She has long term issues with her bowels. She tried to have a BM daily, but more commonly every 2-3 days. More constipated leading up to her cycle. She doesn't notice a change in the pain the week prior to her cycle. No urinary frequency, urgency or dysuria. Nocturia 2-3 x night, normal amounts.  She is sexually active, occasional deep dyspareunia, similar to the other pain. Positional. No STD concerns.   The pain is always worse when she is on her vitamin regimen. No change in her bowel with vitamins.   She has persistent hematuria and has a Urology consult pending.   Cycles are monthly x 4 days. Can saturate a super tampon in up to 5 hours. She has moderate cramps, have worsened with time. Some help with ibuprofen.  She is having hot flashes, mood changes, can have significant mood changes for a week prior to her cycle. Varies from cycle to cycle.   She has had issues with nausea with birth control. Nuva ring and depo-provera were the worst contraception for her.   Patient's last menstrual period was 04/19/2019.          Sexually active: Yes.    The current method of family planning is tubal ligation.    Exercising: No.  The patient does not participate in regular exercise  at present. Smoker:  no  Health Maintenance: Pap:  2018 Normal  History of abnormal Pap:  no MMG:  07/26/18 Density c Bi-rads 1neg  BMD:   NA Colonoscopy: NA TDaP:  07/05/10 Gardasil: no   reports that she has never smoked. She has never used smokeless tobacco. She reports current alcohol use. She reports previous drug use. Drug: Marijuana. Drinks on the weekends, maybe a small bottle of wine. 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   Past Medical History:  Diagnosis Date  . Anemia   . Cholestasis   . SVD (spontaneous vaginal delivery)    x 2    Past Surgical History:  Procedure Laterality Date  . BREAST BIOPSY Left    Fibroid tissue 2003  . DILATION AND EVACUATION N/A 05/14/2014   Procedure: DILATATION AND EVACUATION;  Surgeon: Cheri Fowler, MD;  Location: [facility];  Service: Gynecology;  Laterality: N/A;  . DILATION AND EVACUATION N/A 09/27/2017   Procedure: DILATATION AND EVACUATION;  Surgeon: Cheri Fowler, MD;  Location: [facility];  Service: Gynecology;  Laterality: N/A;  . INDUCED ABORTION    . LAPAROSCOPIC BILATERAL SALPINGECTOMY Bilateral 09/27/2017   Procedure: LAPAROSCOPIC BILATERAL SALPINGECTOMY;  Surgeon: Cheri Fowler, MD;  Location: [facility];  Service: Gynecology;  Laterality: Bilateral;  .  WISDOM TOOTH EXTRACTION      Current Outpatient Medications  Medication Sig Dispense Refill  . Ascorbic Acid (VITAMIN C) 100 MG tablet Take 100 mg by mouth daily.    . cholecalciferol (VITAMIN D3) 25 MCG (1000 UT) tablet Take 1,000 Units by mouth daily.    . Cyanocobalamin (VITAMIN B-12 PO) Take 1 each by mouth daily.     No current facility-administered medications for this visit.     Family History  Problem Relation Age of Onset  . Hypertension Brother   . Dementia Maternal Grandmother   . Cancer Maternal Grandfather   . Cancer Mother        breast  . Breast cancer Mother   . Colon cancer Neg Hx     Review of Systems   Genitourinary: Positive for pelvic pain.  All other systems reviewed and are negative.   Exam:   BP 110/70   Pulse (!) 147   Temp (!) 97.4 F (36.3 C)   Ht 5' 8.5" (1.74 m)   Wt 180 lb (81.6 kg)   LMP 04/19/2019   SpO2 98%   BMI 26.97 kg/m   Weight change: @WEIGHTCHANGE @ Height:   Height: 5' 8.5" (174 cm)  Ht Readings from Last 3 Encounters:  05/09/19 5' 8.5" (1.74 m)  05/03/19 5\' 8"  (1.727 m)  04/17/19 5\' 8"  (1.727 m)    General appearance: alert, cooperative and appears stated age Head: Normocephalic, without obvious abnormality, atraumatic Neck: no adenopathy, supple, symmetrical, trachea midline and thyroid normal to inspection and palpation Lungs: clear to auscultation bilaterally Cardiovascular: regular rate and rhythm Abdomen: soft, non-tender; non distended,  no masses,  no organomegaly Extremities: extremities normal, atraumatic, no cyanosis or edema Skin: Skin color, texture, turgor normal. No rashes or lesions Lymph nodes: Cervical, supraclavicular, and axillary nodes normal. No abnormal inguinal nodes palpated Neurologic: Grossly normal   Pelvic: External genitalia:  no lesions              Urethra:  normal appearing urethra with no masses, tenderness or lesions              Bartholins and Skenes: normal                 Vagina: normal appearing vagina with normal color and discharge, no lesions              Cervix: no cervical motion tenderness and no lesions               Bimanual Exam:  Uterus:  retroverted, mobile, normal sized tender.               Adnexa: no masses, tender in the right adnexa.                Rectovaginal: Confirms               Anus:  normal sphincter tone, no lesions  No pelvic floor or bladder tenderness  Chaperone was present for exam.  A:  Pelvic/abdominal pain. Constipation and pain are worse the week prior to her cycle  Long term issues with constipation  Uterine tenderness  Dyspareunia  PMS  Constipation (recent normal  TSH)  P:   Send urine for GC/CT  Treat with doxycycline for possible endometritis. I don't think this is likely responsible for her RLQ pain, but could cause her dyspareunia.   F/U in 2 weeks for a repeat exam  Discussed the option of a trial of OCPs (no  contraindications), she declines  Information on contraception given  Will refer to GI for further evaluation of pain and constipation    CC: [name], PA Note sent

## 2019-05-12 LAB — GC/CHLAMYDIA PROBE AMP
Chlamydia trachomatis, NAA: NEGATIVE
Neisseria Gonorrhoeae by PCR: NEGATIVE

## 2019-05-18 NOTE — Progress Notes (Deleted)
05/18/2019 Sarah Parks [id number] 12-23-1980   HISTORY OF PRESENT ILLNESS: Sarah Parks is a 38 year old female with a past medical history of anemia, hematuria. Past tubal ligation.   Abdominal/pelvic CT 04/19/2019: 1. No cause for the patient's symptoms identified. 2. Signs of corpus luteum cyst on the right. 3. Small umbilical hernia contains only fat. No signs of free air. No signs of focal fluid collection. 4.Probable background hepatic steatosis with focal fatty intensive occasion along interloop lobar fissure.  CBC Latest Ref Rng & Units 04/17/2019 08/01/2018 03/27/2018  WBC 4.0 - 10.5 K/uL 8.1 7.9 7.5  Hemoglobin 12.0 - 15.0 g/dL 14.2 15.0 14.5  Hematocrit 36.0 - 46.0 % 41.5 44.8 43.2  Platelets 150.0 - 400.0 K/uL 293.0 316.0 310.0   CMP Latest Ref Rng & Units 04/17/2019 08/01/2018 03/27/2018  Glucose 70 - 99 mg/dL 81 88 83  BUN 6 - 23 mg/dL 10 11 11   Creatinine 0.40 - 1.20 mg/dL 0.89 0.90 0.83  Sodium 135 - 145 mEq/L 137 136 137  Potassium 3.5 - 5.1 mEq/L 4.2 4.2 4.0  Chloride 96 - 112 mEq/L 104 103 104  CO2 19 - 32 mEq/L 25 26 27   Calcium 8.4 - 10.5 mg/dL 9.3 9.7 9.8  Total Protein 6.0 - 8.3 g/dL 7.2 7.4 8.1  Total Bilirubin 0.2 - 1.2 mg/dL 0.6 0.7 0.5  Alkaline Phos 39 - 117 U/L 57 63 58  AST 0 - 37 U/L 13 14 19   ALT 0 - 35 U/L 16 21 20      Past Medical History:  Diagnosis Date  . Anemia   . Cholestasis   . SVD (spontaneous vaginal delivery)    x 2   Past Surgical History:  Procedure Laterality Date  . BREAST BIOPSY Left    Fibroid tissue 2003  . DILATION AND EVACUATION N/A 05/14/2014   Procedure: DILATATION AND EVACUATION;  Surgeon: Cheri Fowler, MD;  Location: [facility];  Service: Gynecology;  Laterality: N/A;  . DILATION AND EVACUATION N/A 09/27/2017   Procedure: DILATATION AND EVACUATION;  Surgeon: Cheri Fowler, MD;  Location: [facility];  Service: Gynecology;  Laterality: N/A;  . INDUCED ABORTION    . LAPAROSCOPIC BILATERAL  SALPINGECTOMY Bilateral 09/27/2017   Procedure: LAPAROSCOPIC BILATERAL SALPINGECTOMY;  Surgeon: Cheri Fowler, MD;  Location: [facility];  Service: Gynecology;  Laterality: Bilateral;  . WISDOM TOOTH EXTRACTION      reports that she has never smoked. She has never used smokeless tobacco. She reports current alcohol use of about 3.0 standard drinks of alcohol per week. She reports previous drug use. Drug: Marijuana. family history includes Breast cancer in her mother; Cancer in her maternal grandfather and mother; Dementia in her maternal grandmother; Hypertension in her brother. No Known Allergies    Outpatient Encounter Medications as of 05/20/2019  Medication Sig  . Ascorbic Acid (VITAMIN C) 100 MG tablet Take 100 mg by mouth daily.  . cholecalciferol (VITAMIN D3) 25 MCG (1000 UT) tablet Take 1,000 Units by mouth daily.  . Cyanocobalamin (VITAMIN B-12 PO) Take 1 each by mouth daily.  Marland Kitchen doxycycline (VIBRAMYCIN) 100 MG capsule Take 1 capsule (100 mg total) by mouth 2 (two) times daily. Take BID for 10 days.  Take with food as can cause GI distress.   No facility-administered encounter medications on file as of 05/20/2019.      REVIEW OF SYSTEMS  : All other systems reviewed and negative except where noted in the History of Present Illness.  PHYSICAL EXAM: There were no vitals taken for this visit. General: Well developed white female in no acute distress Head: Normocephalic and atraumatic Eyes:  sclerae anicteric,conjunctive pink. Ears: Normal auditory acuity Neck: Supple, no masses.  Lungs: Clear throughout to auscultation Heart: Regular rate and rhythm Abdomen: Soft, nontender, non distended. No masses or hepatomegaly noted. Normal bowel sounds Rectal:  Musculoskeletal: Symmetrical with no gross deformities  Skin: No lesions on visible extremities Extremities: No edema  Neurological: Alert oriented x 4, grossly nonfocal Cervical Nodes:  No significant cervical adenopathy  Psychological:  Alert and cooperative. Normal mood and affect  ASSESSMENT AND PLAN:  29. 38 year old female with abdominal pain  2. Hematuria   3. Fatty liver per CTAP. Normal LFTs.  4. Small umbilical hernia containing fat per CTAP    CC:  Romualdo Bolk, MD

## 2019-05-20 ENCOUNTER — Ambulatory Visit: Payer: 59 | Admitting: Nurse Practitioner

## 2019-05-23 ENCOUNTER — Ambulatory Visit: Payer: 59 | Admitting: Obstetrics and Gynecology

## 2019-06-07 ENCOUNTER — Ambulatory Visit (INDEPENDENT_AMBULATORY_CARE_PROVIDER_SITE_OTHER): Payer: 59 | Admitting: Physician Assistant

## 2019-06-07 ENCOUNTER — Encounter: Payer: Self-pay | Admitting: Physician Assistant

## 2019-06-07 DIAGNOSIS — J029 Acute pharyngitis, unspecified: Secondary | ICD-10-CM

## 2019-06-07 MED ORDER — AMOXICILLIN 500 MG PO CAPS: 500.0000 mg | ORAL_CAPSULE | Freq: Two times a day (BID) | ORAL | 0 refills | Status: AC

## 2019-06-07 NOTE — Progress Notes (Signed)
Virtual Visit via Video   I connected with Sarah Parks on 06/07/19 at 11:20 AM EST by a video enabled telemedicine application and verified that I am speaking with the correct person using two identifiers. Location patient: Home Location provider: [facility], Office Persons participating in the virtual visit: Sarah Parks, [place] PA-C  I discussed the limitations of evaluation and management by telemedicine and the availability of in person appointments. The patient expressed understanding and agreed to proceed.  Subjective:   HPI:   Sore throat Patient reports that she has had a sore throat that started today.  Her daughter was recently diagnosed with strep.  Her daughter was having sore throat and other upper respiratory symptoms and was tested for strep, flu and Covid.  Flu and Covid test were negative.  Patient denies fever, chills, cough, nasal congestion.  She has not tried anything for symptoms yet.  When she looks in the back of her throat she sees white patches.  ROS: See pertinent positives and negatives per HPI.  Patient Active Problem List   Diagnosis Date Noted  . Constipation 01/24/2019  . Vitamin B12 deficiency 04/27/2017  . Back pain 01/27/2017  . Family history of malignant neoplasm of breast 07/28/2016  . Cholestasis of pregnancy in third trimester 06/12/2015  . SVD (spontaneous vaginal delivery) 06/12/2015  . Missed abortion 05/14/2014    Social History   Tobacco Use  . Smoking status: Never Smoker  . Smokeless tobacco: Never Used  Substance Use Topics  . Alcohol use: Yes    Alcohol/week: 3.0 standard drinks    Types: 2 Glasses of wine, 1 Cans of beer per week    Current Outpatient Medications:  .  Cyanocobalamin (VITAMIN B-12 PO), Take 1 each by mouth daily., Disp: , Rfl:  .  amoxicillin (AMOXIL) 500 MG capsule, Take 1 capsule (500 mg total) by mouth 2 (two) times daily for 10 days., Disp: 20 capsule, Rfl: 0  No Known  Allergies  Objective:   VITALS: Per patient if applicable, see vitals. GENERAL: Alert, appears well and in no acute distress. HEENT: Atraumatic, conjunctiva clear, no obvious abnormalities on inspection of external nose and ears. NECK: Normal movements of the head and neck. CARDIOPULMONARY: No increased WOB. Speaking in clear sentences. I:E ratio WNL.  MS: Moves all visible extremities without noticeable abnormality. PSYCH: Pleasant and cooperative, well-groomed. Speech normal rate and rhythm. Affect is appropriate. Insight and judgement are appropriate. Attention is focused, linear, and appropriate.  NEURO: CN grossly intact. Oriented as arrived to appointment on time with no prompting. Moves both UE equally.  SKIN: No obvious lesions, wounds, erythema, or cyanosis noted on face or hands.  Assessment and Plan:   Sarah Parks was seen today for strep throat.  Diagnoses and all orders for this visit:  Pharyngitis, unspecified etiology  Other orders -     amoxicillin (AMOXIL) 500 MG capsule; Take 1 capsule (500 mg total) by mouth 2 (two) times daily for 10 days.   No red flags on exam.  Did have positive exposure to strep and is now having symptoms.  Will initiate amoxicillin per orders. Discussed taking medications as prescribed. Reviewed return precautions including worsening fever, SOB, worsening cough or other concerns. Push fluids and rest. I recommend that patient follow-up if symptoms worsen or persist despite treatment x 7-10 days, sooner if needed.  . Reviewed expectations re: course of current medical issues. . Discussed self-management of symptoms. . Outlined signs and symptoms indicating need  for more acute intervention. . Patient verbalized understanding and all questions were answered. Marland Kitchen Health Maintenance issues including appropriate healthy diet, exercise, and smoking avoidance were discussed with patient. . See orders for this visit as documented in the electronic medical  record.  I discussed the assessment and treatment plan with the patient. The patient was provided an opportunity to ask questions and all were answered. The patient agreed with the plan and demonstrated an understanding of the instructions.   The patient was advised to call back or seek an in-person evaluation if the symptoms worsen or if the condition fails to improve as anticipated.   [place], [name] 06/07/2019

## 2019-08-20 IMAGING — US US PELVIS COMPLETE WITH TRANSVAGINAL
1 series · 13 of 25 positions shown · non-contrast
Comparison: None

CLINICAL DATA: RIGHT lower quadrant pelvic pain occurring monthly
about 3 days prior to menses, patient states RIGHT lower quadrant
pain now daily for 2 weeks



[Series 1: us pelvis complete with transvaginal · 0.28mm/px · 13 of 59 slices shown]
[im 1/59]
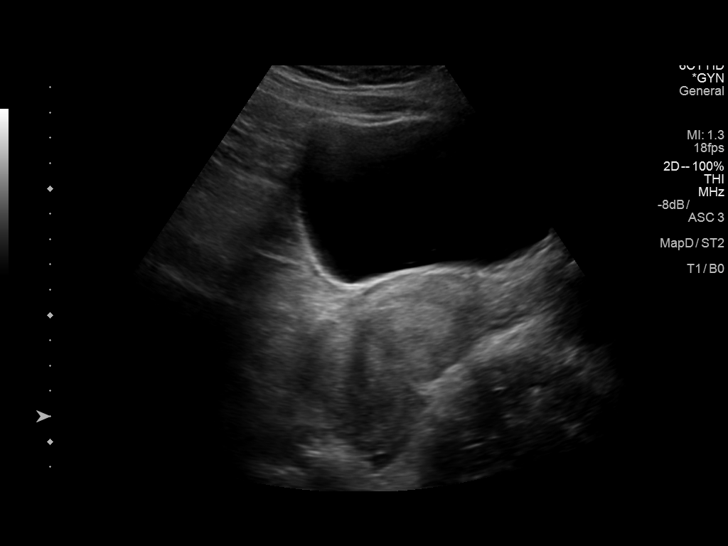
[im 5/59]
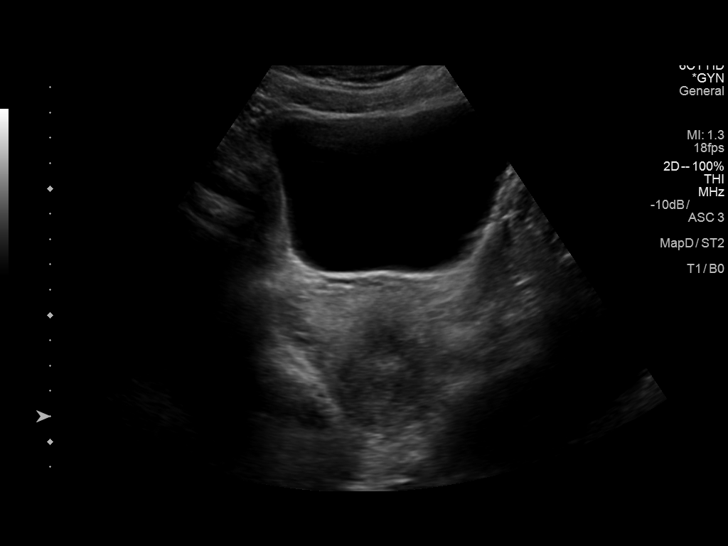
[im 10/59]
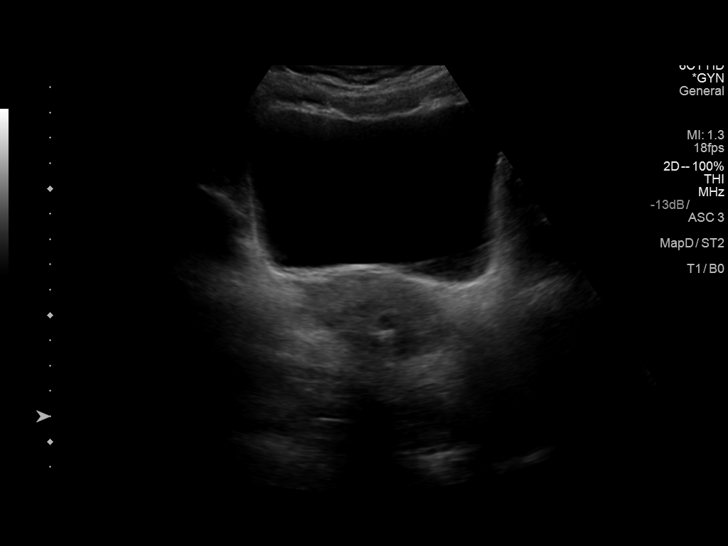
[im 15/59]
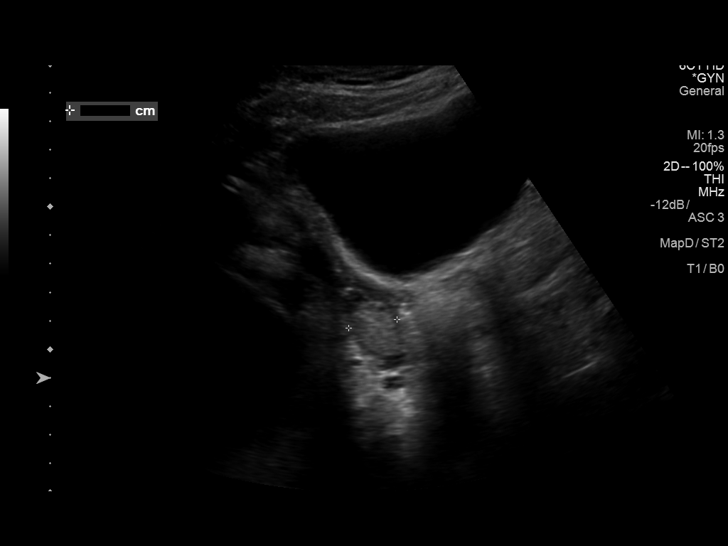
[im 20/59]
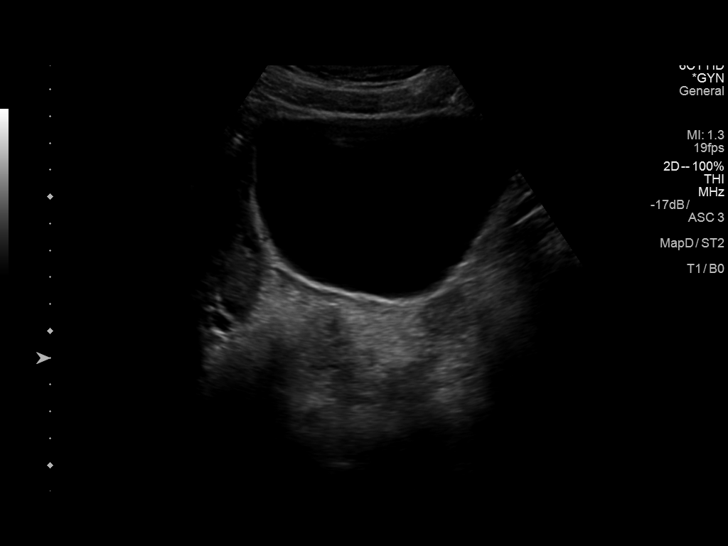
[im 25/59]
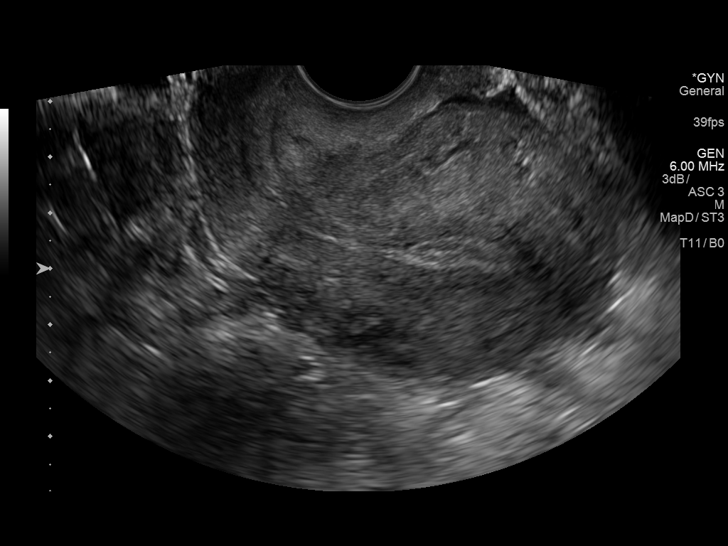
[im 30/59]
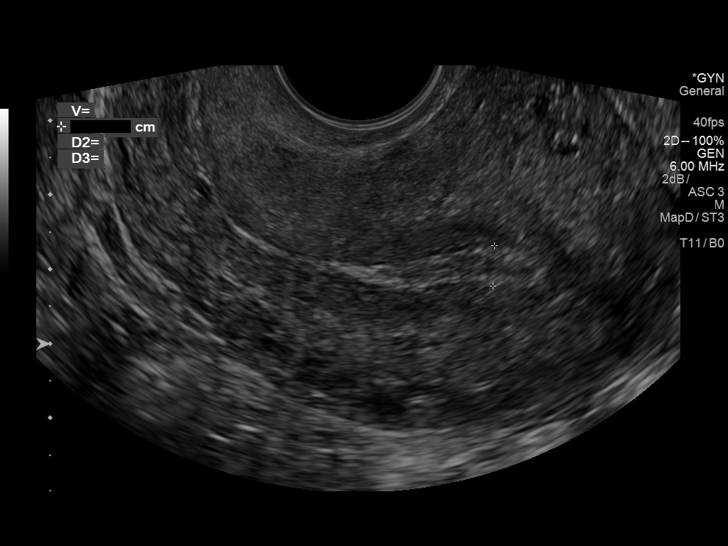
[im 34/59]
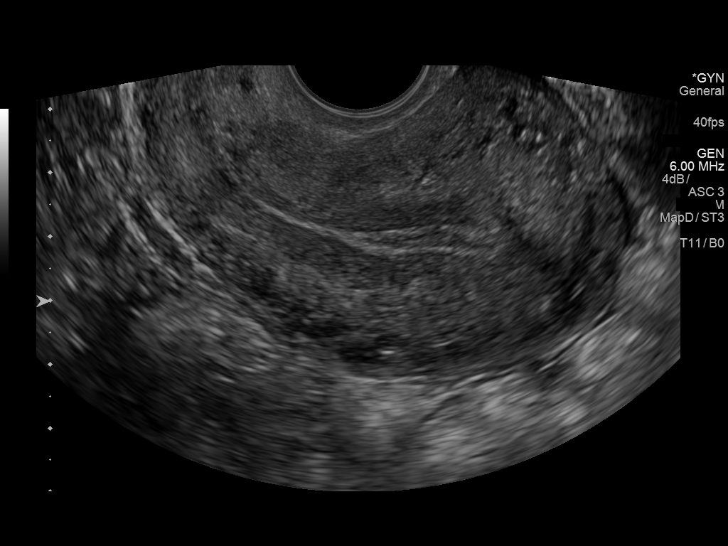
[im 39/59]
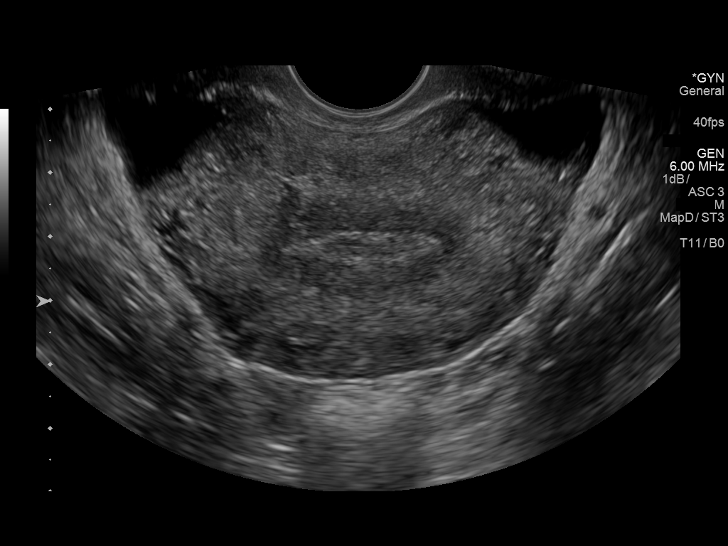
[im 44/59]
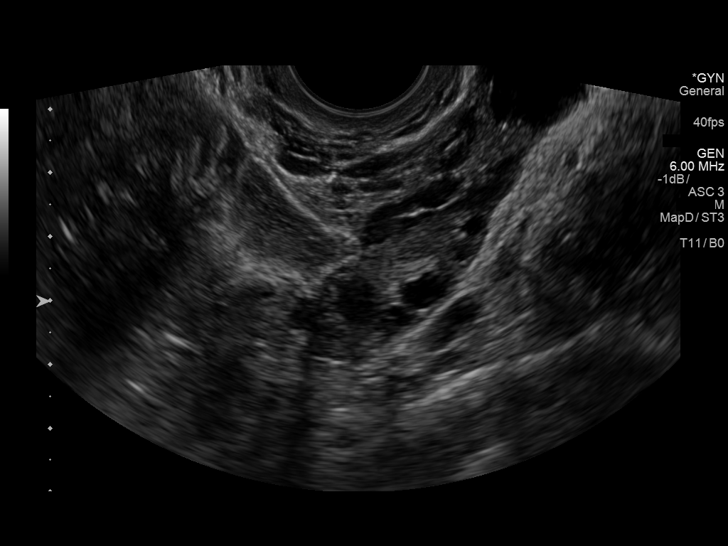
[im 49/59]
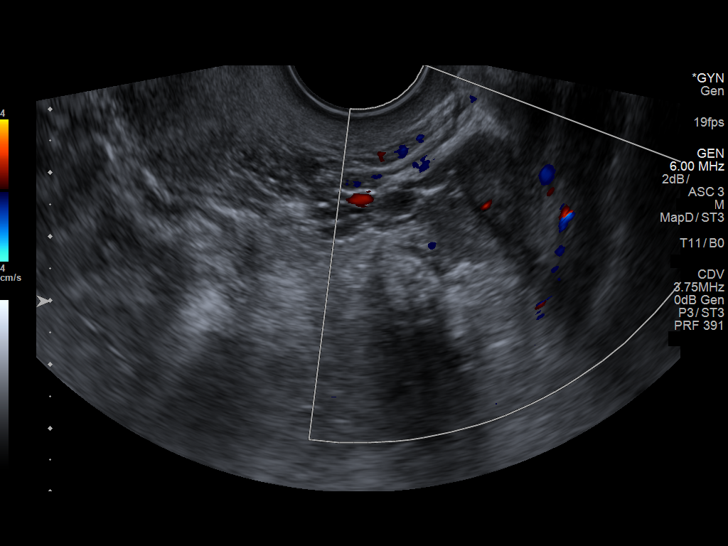
[im 54/59]
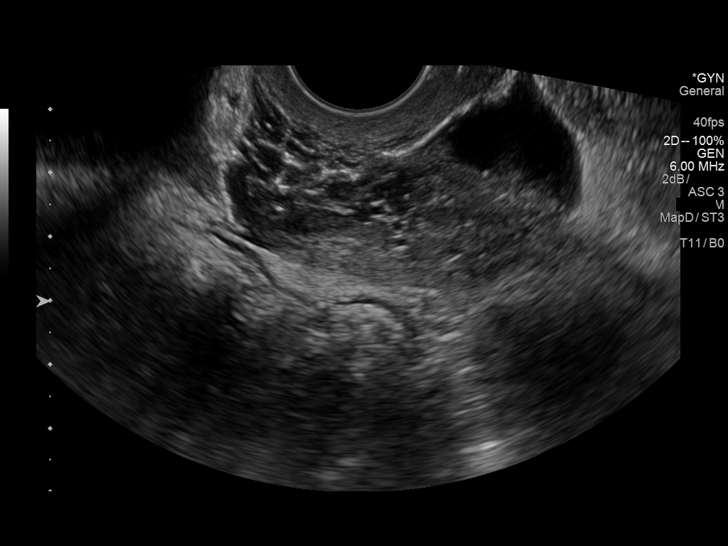
[im 59/59]
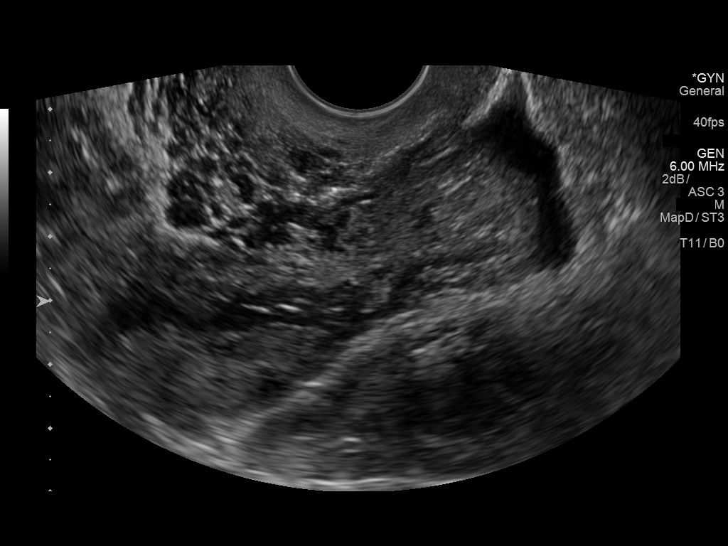

[13 of 25 positions shown; findings below may reference images not displayed]

FINDINGS: Uterus

Measurements: 8.2 x 5.0 x 5.9 cm = volume: 126 mL. Retroverted.
Normal morphology without mass

Endometrium

Thickness: 5 mm, normal.  No endometrial fluid or focal abnormality

Right ovary

Measurements: 3.5 x 1.5 x 3.1 cm = volume: 8.4 mL. Normal morphology
without mass. Blood flow present within RIGHT ovary on color Doppler
imaging.

Left ovary

Measurements: 4.1 x 2.1 x 1.2 cm = volume: 5.5 mL. Normal morphology
without mass. Blood flow present within LEFT ovary on color Doppler
imaging.

Other findings

Small amount of nonspecific free pelvic fluid.  No adnexal masses.
IMPRESSION: Retroverted uterus.

Small amount of nonspecific free pelvic fluid.

Otherwise negative exam.

## 2019-10-23 ENCOUNTER — Other Ambulatory Visit: Payer: Self-pay

## 2019-10-23 ENCOUNTER — Encounter: Payer: Self-pay | Admitting: Physician Assistant

## 2019-10-23 ENCOUNTER — Other Ambulatory Visit (HOSPITAL_COMMUNITY)
Admission: RE | Admit: 2019-10-23 | Discharge: 2019-10-23 | Disposition: A | Discharge status: Home / Self Care | Payer: 59 | Source: Ambulatory Visit | Attending: Physician Assistant | Admitting: Physician Assistant

## 2019-10-23 ENCOUNTER — Ambulatory Visit (INDEPENDENT_AMBULATORY_CARE_PROVIDER_SITE_OTHER): Payer: 59 | Admitting: Physician Assistant

## 2019-10-23 VITALS — BP 120/82 | HR 96 | Temp 98.4°F | Ht 68.5 in | Wt 181.4 lb

## 2019-10-23 DIAGNOSIS — Z124 Encounter for screening for malignant neoplasm of cervix: Secondary | ICD-10-CM

## 2019-10-23 DIAGNOSIS — E538 Deficiency of other specified B group vitamins: Secondary | ICD-10-CM | POA: Diagnosis not present

## 2019-10-23 DIAGNOSIS — Z136 Encounter for screening for cardiovascular disorders: Secondary | ICD-10-CM

## 2019-10-23 DIAGNOSIS — E663 Overweight: Secondary | ICD-10-CM

## 2019-10-23 DIAGNOSIS — Z803 Family history of malignant neoplasm of breast: Secondary | ICD-10-CM

## 2019-10-23 DIAGNOSIS — Z13220 Encounter for screening for lipoid disorders: Secondary | ICD-10-CM

## 2019-10-23 DIAGNOSIS — Z0000 Encounter for general adult medical examination without abnormal findings: Secondary | ICD-10-CM

## 2019-10-23 LAB — CBC WITH DIFFERENTIAL/PLATELET
Basophils Absolute: 0.1 10*3/uL (ref 0.0–0.1)
Basophils Relative: 1.4 % (ref 0.0–3.0)
Eosinophils Absolute: 0.1 10*3/uL (ref 0.0–0.7)
Eosinophils Relative: 1.2 % (ref 0.0–5.0)
HCT: 44.7 % (ref 36.0–46.0)
Hemoglobin: 15.1 g/dL — ABNORMAL HIGH (ref 12.0–15.0)
Lymphocytes Relative: 33.6 % (ref 12.0–46.0)
Lymphs Abs: 2.9 10*3/uL (ref 0.7–4.0)
MCHC: 33.7 g/dL (ref 30.0–36.0)
MCV: 99 fl (ref 78.0–100.0)
Monocytes Absolute: 0.6 10*3/uL (ref 0.1–1.0)
Monocytes Relative: 7.6 % (ref 3.0–12.0)
Neutro Abs: 4.8 10*3/uL (ref 1.4–7.7)
Neutrophils Relative %: 56.2 % (ref 43.0–77.0)
Platelets: 302 10*3/uL (ref 150.0–400.0)
RBC: 4.52 Mil/uL (ref 3.87–5.11)
RDW: 13.2 % (ref 11.5–15.5)
WBC: 8.6 10*3/uL (ref 4.0–10.5)

## 2019-10-23 LAB — COMPREHENSIVE METABOLIC PANEL
ALT: 17 U/L (ref 0–35)
AST: 17 U/L (ref 0–37)
Albumin: 4.5 g/dL (ref 3.5–5.2)
Alkaline Phosphatase: 60 U/L (ref 39–117)
BUN: 11 mg/dL (ref 6–23)
CO2: 27 mEq/L (ref 19–32)
Calcium: 9.8 mg/dL (ref 8.4–10.5)
Chloride: 102 mEq/L (ref 96–112)
Creatinine, Ser: 0.86 mg/dL (ref 0.40–1.20)
GFR: 89.65 mL/min (ref >60.00)
Glucose, Bld: 87 mg/dL (ref 70–99)
Potassium: 4.4 mEq/L (ref 3.5–5.1)
Sodium: 139 mEq/L (ref 135–145)
Total Bilirubin: 0.9 mg/dL (ref 0.2–1.2)
Total Protein: 7.6 g/dL (ref 6.0–8.3)

## 2019-10-23 LAB — LIPID PANEL
Cholesterol: 188 mg/dL (ref 0–200)
HDL: 56.3 mg/dL (ref >39.00)
LDL Cholesterol: 117 mg/dL — ABNORMAL HIGH (ref 0–99)
NonHDL: 131.29
Total CHOL/HDL Ratio: 3
Triglycerides: 71 mg/dL (ref 0.0–149.0)
VLDL: 14.2 mg/dL (ref 0.0–40.0)

## 2019-10-23 LAB — VITAMIN B12: Vitamin B-12: 332 pg/mL (ref 211–911)

## 2019-10-23 NOTE — Patient Instructions (Signed)
It was great to see you!  You will be contacted about your referral to the genetic counselor.  Please go to the lab for blood work.   Our office will call you with your results unless you have chosen to receive results via MyChart.  If your blood work is normal we will follow-up each year for physicals and as scheduled for chronic medical problems.  If anything is abnormal we will treat accordingly and get you in for a follow-up.  Take care,  [facility] Maintenance, Female Adopting a healthy lifestyle and getting preventive care are important in promoting health and wellness. Ask your health care provider about:  The right schedule for you to have regular tests and exams.  Things you can do on your own to prevent diseases and keep yourself healthy. What should I know about diet, weight, and exercise? Eat a healthy diet   Eat a diet that includes plenty of vegetables, fruits, low-fat dairy products, and lean protein.  Do not eat a lot of foods that are high in solid fats, added sugars, or sodium. Maintain a healthy weight Body mass index (BMI) is used to identify weight problems. It estimates body fat based on height and weight. Your health care provider can help determine your BMI and help you achieve or maintain a healthy weight. Get regular exercise Get regular exercise. This is one of the most important things you can do for your health. Most adults should:  Exercise for at least 150 minutes each week. The exercise should increase your heart rate and make you sweat (moderate-intensity exercise).  Do strengthening exercises at least twice a week. This is in addition to the moderate-intensity exercise.  Spend less time sitting. Even light physical activity can be beneficial. Watch cholesterol and blood lipids Have your blood tested for lipids and cholesterol at 37 years of age, then have this test every 5 years. Have your cholesterol levels checked more often  if:  Your lipid or cholesterol levels are high.  You are older than 38 years of age.  You are at high risk for heart disease. What should I know about cancer screening? Depending on your health history and family history, you may need to have cancer screening at various ages. This may include screening for:  Breast cancer.  Cervical cancer.  Colorectal cancer.  Skin cancer.  Lung cancer. What should I know about heart disease, diabetes, and high blood pressure? Blood pressure and heart disease  High blood pressure causes heart disease and increases the risk of stroke. This is more likely to develop in people who have high blood pressure readings, are of African descent, or are overweight.  Have your blood pressure checked: ? Every 3-5 years if you are 13-42 years of age. ? Every year if you are 52 years old or older. Diabetes Have regular diabetes screenings. This checks your fasting blood sugar level. Have the screening done:  Once every three years after age 26 if you are at a normal weight and have a low risk for diabetes.  More often and at a younger age if you are overweight or have a high risk for diabetes. What should I know about preventing infection? Hepatitis B If you have a higher risk for hepatitis B, you should be screened for this virus. Talk with your health care provider to find out if you are at risk for hepatitis B infection. Hepatitis C Testing is recommended for:  Everyone born from [birth year]  through 1965.  Anyone with known risk factors for hepatitis C. Sexually transmitted infections (STIs)  Get screened for STIs, including gonorrhea and chlamydia, if: ? You are sexually active and are younger than 38 years of age. ? You are older than 38 years of age and your health care provider tells you that you are at risk for this type of infection. ? Your sexual activity has changed since you were last screened, and you are at increased risk for chlamydia or  gonorrhea. Ask your health care provider if you are at risk.  Ask your health care provider about whether you are at high risk for HIV. Your health care provider may recommend a prescription medicine to help prevent HIV infection. If you choose to take medicine to prevent HIV, you should first get tested for HIV. You should then be tested every 3 months for as long as you are taking the medicine. Pregnancy  If you are about to stop having your period (premenopausal) and you may become pregnant, seek counseling before you get pregnant.  Take 400 to 800 micrograms (mcg) of folic acid every day if you become pregnant.  Ask for birth control (contraception) if you want to prevent pregnancy. Osteoporosis and menopause Osteoporosis is a disease in which the bones lose minerals and strength with aging. This can result in bone fractures. If you are 61 years old or older, or if you are at risk for osteoporosis and fractures, ask your health care provider if you should:  Be screened for bone loss.  Take a calcium or vitamin D supplement to lower your risk of fractures.  Be given hormone replacement therapy (HRT) to treat symptoms of menopause. Follow these instructions at home: Lifestyle  Do not use any products that contain nicotine or tobacco, such as cigarettes, e-cigarettes, and chewing tobacco. If you need help quitting, ask your health care provider.  Do not use street drugs.  Do not share needles.  Ask your health care provider for help if you need support or information about quitting drugs. Alcohol use  Do not drink alcohol if: ? Your health care provider tells you not to drink. ? You are pregnant, may be pregnant, or are planning to become pregnant.  If you drink alcohol: ? Limit how much you use to 0-1 drink a day. ? Limit intake if you are breastfeeding.  Be aware of how much alcohol is in your drink. In the [place] one drink equals one 12 oz bottle of beer (355 mL), one 5 oz  glass of wine (148 mL), or one 1 oz glass of hard liquor (44 mL). General instructions  Schedule regular health, dental, and eye exams.  Stay current with your vaccines.  Tell your health care provider if: ? You often feel depressed. ? You have ever been abused or do not feel safe at home. Summary  Adopting a healthy lifestyle and getting preventive care are important in promoting health and wellness.  Follow your health care provider's instructions about healthy diet, exercising, and getting tested or screened for diseases.  Follow your health care provider's instructions on monitoring your cholesterol and blood pressure. This information is not intended to replace advice given to you by your health care provider. Make sure you discuss any questions you have with your health care provider. Document Revised: 06/13/2018 Document Reviewed: 06/13/2018 Elsevier Patient Education  2020 [employer].

## 2019-10-23 NOTE — Progress Notes (Signed)
I acted as a Education administrator for [employer], PA-C [place], [place]  Subjective:    Sarah Parks is a 39 y.o. female and is here for a comprehensive physical exam. Patient is not fasting.  HPI    Health Maintenance Due  Topic Date Due  . PAP SMEAR-Modifier  07/05/2019    Acute Concerns: None  Chronic Issues: Vitamin B12 deficiency -- Pt stopped taking oral vitamin B12 over a year ago. Denies paresthesias. Breast cancer in mom -- mom and great aunt with history of breast cancer. Mom recently diagnosed with breast cancer a second time (last month) and underwent double mastectomy.  Health Maintenance: Immunizations -- UTD Colonoscopy -- n/a Mammogram -- n/a PAP -- due today; no history of abnormal Diet -- eating healthier, likes chips (worst of her snacks), limited sodas Caffeine intake -- coffee daily Sleep habits -- no issues Exercise -- none Current Weight -- Weight: 181 lb 6.4 oz (82.3 kg)  Weight History: Wt Readings from Last 10 Encounters:  10/23/19 181 lb 6.4 oz (82.3 kg)  05/09/19 180 lb (81.6 kg)  05/03/19 179 lb (81.2 kg)  04/17/19 181 lb (82.1 kg)  01/30/19 179 lb 6.1 oz (81.4 kg)  09/27/18 183 lb (83 kg)  08/01/18 182 lb (82.6 kg)  03/27/18 183 lb (83 kg)  09/22/17 181 lb (82.1 kg)  07/05/17 179 lb 6.4 oz (81.4 kg)   Mood -- good Patient's last menstrual period was 10/14/2019. Period characteristics -- irregular cycles Birth control -- hx of tubal Alcohol -- has a beer occasional weekdays, occasional cocktail  Depression screen [facility] 2/9 10/23/2019  Decreased Interest 0  Down, Depressed, Hopeless 0  PHQ - 2 Score 0  Altered sleeping -  Tired, decreased energy -  Change in appetite -  Feeling bad or failure about yourself  -  Trouble concentrating -  Moving slowly or fidgety/restless -  Suicidal thoughts -  PHQ-9 Score -  Difficult doing work/chores -     Other providers/specialists: Patient Care Team: Inda Coke, [place] as PCP -  General (Physician Assistant)   PMHx, SurgHx, SocialHx, Medications, and Allergies were reviewed in the Visit Navigator and updated as appropriate.   Past Medical History:  Diagnosis Date  . Anemia   . Cholestasis   . SVD (spontaneous vaginal delivery)    x 2     Past Surgical History:  Procedure Laterality Date  . BREAST BIOPSY Left    Fibroid tissue 2003  . DILATION AND EVACUATION N/A 05/14/2014   Procedure: DILATATION AND EVACUATION;  Surgeon: Cheri Fowler, MD;  Location: [facility];  Service: Gynecology;  Laterality: N/A;  . DILATION AND EVACUATION N/A 09/27/2017   Procedure: DILATATION AND EVACUATION;  Surgeon: Cheri Fowler, MD;  Location: [facility];  Service: Gynecology;  Laterality: N/A;  . INDUCED ABORTION    . LAPAROSCOPIC BILATERAL SALPINGECTOMY Bilateral 09/27/2017   Procedure: LAPAROSCOPIC BILATERAL SALPINGECTOMY;  Surgeon: Cheri Fowler, MD;  Location: [facility];  Service: Gynecology;  Laterality: Bilateral;  . WISDOM TOOTH EXTRACTION       Family History  Problem Relation Age of Onset  . Hypertension Brother   . Dementia Maternal Grandmother   . Cancer Maternal Grandfather   . Cancer Mother        breast  . Colon cancer Neg Hx     Social History   Tobacco Use  . Smoking status: Never Smoker  . Smokeless tobacco: Never Used  Substance Use Topics  . Alcohol use: Yes  Alcohol/week: 3.0 standard drinks    Types: 1 Cans of beer, 2 Shots of liquor per week  . Drug use: Not Currently    Types: Marijuana    Review of Systems:   Review of Systems  Constitutional: Negative.  Negative for chills, fever, malaise/fatigue and weight loss.  HENT: Negative.  Negative for hearing loss, sinus pain and sore throat.   Eyes: Negative.   Respiratory: Negative.  Negative for cough and hemoptysis.   Cardiovascular: Negative.  Negative for chest pain, palpitations, leg swelling and PND.  Gastrointestinal: Negative.  Negative for abdominal pain, constipation, diarrhea,  heartburn, nausea and vomiting.  Genitourinary: Negative.  Negative for dysuria, frequency and urgency.  Musculoskeletal: Negative.  Negative for back pain, myalgias and neck pain.  Skin: Negative.  Negative for itching and rash.  Neurological: Negative.  Negative for dizziness, tingling, seizures and headaches.  Endo/Heme/Allergies: Negative.  Negative for polydipsia.  Psychiatric/Behavioral: Negative.  Negative for depression. The patient is not nervous/anxious.     Objective:   BP 120/82 (BP Location: Left Arm, Patient Position: Sitting, Cuff Size: Normal)   Pulse 96   Temp 98.4 F (36.9 C) (Temporal)   Ht 5' 8.5" (1.74 m)   Wt 181 lb 6.4 oz (82.3 kg)   LMP 10/14/2019   SpO2 99%   BMI 27.18 kg/m   General Appearance:    Alert, cooperative, no distress, appears stated age  Head:    Normocephalic, without obvious abnormality, atraumatic  Eyes:    PERRL, conjunctiva/corneas clear, EOM's intact, fundi    benign, both eyes  Ears:    Normal TM's and external ear canals, both ears  Nose:   Nares normal, septum midline, mucosa normal, no drainage    or sinus tenderness  Throat:   Lips, mucosa, and tongue normal; teeth and gums normal  Neck:   Supple, symmetrical, trachea midline, no adenopathy;    thyroid:  no enlargement/tenderness/nodules; no carotid   bruit or JVD  Back:     Symmetric, no curvature, ROM normal, no CVA tenderness  Lungs:     Clear to auscultation bilaterally, respirations unlabored  Chest Wall:    No tenderness or deformity   Heart:    Regular rate and rhythm, S1 and S2 normal, no murmur, rub   or gallop  Breast Exam:    Deferred  Abdomen:     Soft, non-tender, bowel sounds active all four quadrants,    no masses, no organomegaly  Genitalia:    Normal female without lesion, discharge or tenderness  Rectal:    Normal tone no masses or tenderness  Extremities:   Extremities normal, atraumatic, no cyanosis or edema  Pulses:   2+ and symmetric all extremities    Skin:   Skin color, texture, turgor normal, no rashes or lesions  Lymph nodes:   Cervical, supraclavicular, and axillary nodes normal  Neurologic:   CNII-XII intact, normal strength, sensation and reflexes    throughout     Assessment/Plan:   Tanaysha was seen today for annual exam, b12 deficiency and discuss genetic testing.  Diagnoses and all orders for this visit:  Routine physical examination Today patient counseled on age appropriate routine health concerns for screening and prevention, each reviewed and up to date or declined. Immunizations reviewed and up to date or declined. Labs ordered and reviewed. Risk factors for depression reviewed and negative. Hearing function and visual acuity are intact. ADLs screened and addressed as needed. Functional ability and level of safety reviewed and  appropriate. Education, counseling and referrals performed based on assessed risks today. Patient provided with a copy of personalized plan for preventive services. -     Comprehensive metabolic panel -     CBC with Differential/Platelet  B12 deficiency Asymptomatic. Update labs and recommend supplementation. -     Vitamin B12  Encounter for lipid screening for cardiovascular disease -     Lipid panel  Pap smear for cervical cancer screening -     Cytology - PAP( [place])  Overweight Encouraged continued efforts and clean diet; try to add in scheduled exercise.  Family history of breast cancer in mother -     Ambulatory referral to Genetics  Well Adult Exam: Labs ordered: Yes. Patient counseling was done. See below for items discussed. Discussed the patient's BMI. The BMI is not in the acceptable range; BMI management plan is completed Follow up in one year.  Patient Counseling:   [x]     Nutrition: Stressed importance of moderation in sodium/caffeine intake, saturated fat and cholesterol, caloric balance, sufficient intake of fresh fruits, vegetables, fiber, calcium, iron, and 1  mg of folate supplement per day (for females capable of pregnancy).   [x]      Stressed the importance of regular exercise.    [x]     Substance Abuse: Discussed cessation/primary prevention of tobacco, alcohol, or other drug use; driving or other dangerous activities under the influence; availability of treatment for abuse.    [x]      Injury prevention: Discussed safety belts, safety helmets, smoke detector, smoking near bedding or upholstery.    [x]      Sexuality: Discussed sexually transmitted diseases, partner selection, use of condoms, avoidance of unintended pregnancy  and contraceptive alternatives.    [x]     Dental health: Discussed importance of regular tooth brushing, flossing, and dental visits.   [x]      Health maintenance and immunizations reviewed. Please refer to Health maintenance section.   CMA or LPN served as scribe during this visit. History, Physical, and Plan performed by medical provider. The above documentation has been reviewed and is accurate and complete.  , PA-C [place] [facility]

## 2019-10-25 ENCOUNTER — Telehealth: Payer: Self-pay | Admitting: Genetic Counselor

## 2019-10-25 LAB — CYTOLOGY - PAP
Adequacy: ABSENT
Comment: NEGATIVE
Diagnosis: UNDETERMINED — AB
High risk HPV: POSITIVE — AB

## 2019-10-25 NOTE — Telephone Encounter (Signed)
Received a genetic counseling referral from [name], [name] for fhx of breast cancer. Pt has been cld and scheduled for a virtual visit w/Karen Lowell Guitar on 4/28 at 10am. I verified the pt has an active mychart account. Pt aware that a link will be sent to her via text.

## 2019-10-30 ENCOUNTER — Inpatient Hospital Stay: Payer: No Typology Code available for payment source | Attending: Genetic Counselor | Admitting: Genetic Counselor

## 2019-10-30 ENCOUNTER — Other Ambulatory Visit: Payer: Self-pay | Admitting: Genetic Counselor

## 2019-10-30 ENCOUNTER — Encounter: Payer: Self-pay | Admitting: Genetic Counselor

## 2019-10-30 DIAGNOSIS — Z803 Family history of malignant neoplasm of breast: Secondary | ICD-10-CM

## 2019-10-30 NOTE — Progress Notes (Signed)
REFERRING PROVIDER: Inda Coke, [address],  [place] [id number]  PRIMARY PROVIDER:  Inda Coke, [place]  PRIMARY REASON FOR VISIT:  1. Family history of malignant neoplasm of breast      HISTORY OF PRESENT ILLNESS:  I connected with  Sarah Parks on 10/30/2019 at 10:00 AM EDT by MyChart video conference and verified that I am speaking with the correct person using two identifiers.   Patient location: Home Provider location: [facility]  Sarah Parks, a 39 y.o. female, was seen for a [place] cancer genetics consultation at the request of Aldona Bar. Morene Rankins due to a family history of breast cancer.  Sarah Parks presents to clinic today to discuss the possibility of a hereditary predisposition to cancer, genetic testing, and to further clarify her future cancer risks, as well as potential cancer risks for family members.   Sarah Parks is a 39 y.o. female with no personal history of cancer.    CANCER HISTORY:  Oncology History   No history exists.     RISK FACTORS:  Menarche was at age 3.  First live birth at age 13.   Ovaries intact: yes.  Hysterectomy: no.  Menopausal status: premenopausal.  HRT use: 0 years. Colonoscopy: no; not examined. Mammogram within the last year: yes. Number of breast biopsies: 1. Up to date with pelvic exams: yes. Any excessive radiation exposure in the past: no  Past Medical History:  Diagnosis Date  . Anemia   . Cholestasis   . SVD (spontaneous vaginal delivery)    x 2    Past Surgical History:  Procedure Laterality Date  . BREAST BIOPSY Left    Fibroid tissue 2003  . DILATION AND EVACUATION N/A 05/14/2014   Procedure: DILATATION AND EVACUATION;  Surgeon: Cheri Fowler, MD;  Location: [facility];  Service: Gynecology;  Laterality: N/A;  . DILATION AND EVACUATION N/A 09/27/2017   Procedure: DILATATION AND EVACUATION;  Surgeon: Cheri Fowler, MD;  Location: [facility];  Service: Gynecology;  Laterality: N/A;  . INDUCED  ABORTION    . LAPAROSCOPIC BILATERAL SALPINGECTOMY Bilateral 09/27/2017   Procedure: LAPAROSCOPIC BILATERAL SALPINGECTOMY;  Surgeon: Cheri Fowler, MD;  Location: [facility];  Service: Gynecology;  Laterality: Bilateral;  . WISDOM TOOTH EXTRACTION      Social History   Socioeconomic History  . Marital status: Married    Spouse name: Not on file  . Number of children: Not on file  . Years of education: Not on file  . Highest education level: Not on file  Occupational History  . Not on file  Tobacco Use  . Smoking status: Never Smoker  . Smokeless tobacco: Never Used  Substance and Sexual Activity  . Alcohol use: Yes    Alcohol/week: 3.0 standard drinks    Types: 1 Cans of beer, 2 Shots of liquor per week  . Drug use: Not Currently    Types: Marijuana  . Sexual activity: Yes    Birth control/protection: None  Other Topics Concern  . Not on file  Social History Narrative  . Not on file   Social Determinants of Health   Financial Resource Strain:   . Difficulty of Paying Living Expenses:   Food Insecurity:   . Worried About Charity fundraiser in the Last Year:   . Arboriculturist in the Last Year:   Transportation Needs:   . Film/video editor (Medical):   Marland Kitchen Lack of Transportation (Non-Medical):   Physical Activity:   . Days  of Exercise per Week:   . Minutes of Exercise per Session:   Stress:   . Feeling of Stress :   Social Connections:   . Frequency of Communication with Friends and Family:   . Frequency of Social Gatherings with Friends and Family:   . Attends Religious Services:   . Active Member of Clubs or Organizations:   . Attends Archivist Meetings:   Marland Kitchen Marital Status:      FAMILY HISTORY:  We obtained a detailed, 4-generation family history.  Significant diagnoses are listed below: Family History  Problem Relation Age of Onset  . Hypertension Brother   . Dementia Maternal Grandmother   . Cancer Maternal Grandfather        unknown  .  Breast cancer Mother 47       second dx at 41  . HIV/AIDS Father   . Cancer Maternal Uncle        unknown dx  . Breast cancer Other        MGF's sister  . Colon cancer Neg Hx     The patient has two daughters who are cancer free. She has two maternal half brothers who are cancer free.  Her father died of HIV and her mother is living.  Her father is deceased at age 92.  He had a brother and sister who also died in their 30's-40's due to drug and/or alcohol abuse.  No information is known about this side of the family.  Her mother was diagnosed with breast cancer at 70 and 52.  She has 12 brothers and sisters, one brother was diagnosed with an unknown cancer.  The maternal grandparents are deceased. The grandmother died at 60 and the grandfather had an unknown cancer.  He had a sister with breast cancer.  Sarah Parks is aware of previous family history of genetic testing for hereditary cancer risks. Patient's maternal ancestors are of African American descent, and paternal ancestors are of African American descent. There is no reported Ashkenazi Jewish ancestry. There is no known consanguinity.    GENETIC COUNSELING ASSESSMENT: Sarah Parks is a 39 y.o. female with a family history of breast cancer which is somewhat suggestive of a hereditary cancer syndrome and predisposition to cancer given the young age of onset in her mother. We, therefore, discussed and recommended the following at today's visit.   DISCUSSION: We discussed that 5-10% of breast cancer is hereditary, with most cases associated with BRCA mutations.  There are other genes that can be associated with hereditary breast cancer syndromes.  These include PALB2, ATM and CHEK2.  We discussed that testing is beneficial for several reasons including knowing how to follow individuals and understand if other family members could be at risk for cancer and allow them to undergo genetic testing.   We reviewed the characteristics, features and  inheritance patterns of hereditary cancer syndromes. We also discussed genetic testing, including the appropriate family members to test, the process of testing, insurance coverage and turn-around-time for results. We discussed the implications of a negative, positive, carrier and/or variant of uncertain significant result. We recommended Sarah Parks pursue genetic testing for the common hereditary cancer gene panel. The Common Hereditary Gene Panel offered by [facility] includes sequencing and/or deletion duplication testing of the following 48 genes: APC, ATM, AXIN2, BARD1, BMPR1A, BRCA1, BRCA2, BRIP1, CDH1, CDK4, CDKN2A (p14ARF), CDKN2A (p16INK4a), CHEK2, CTNNA1, DICER1, EPCAM (Deletion/duplication testing only), GREM1 (promoter region deletion/duplication testing only), KIT, MEN1, MLH1, MSH2, MSH3, MSH6, MUTYH, NBN,  NF1, NHTL1, PALB2, PDGFRA, PMS2, POLD1, POLE, PTEN, RAD50, RAD51C, RAD51D, RNF43, SDHB, SDHC, SDHD, SMAD4, SMARCA4. STK11, TP53, TSC1, TSC2, and VHL.  The following genes were evaluated for sequence changes only: SDHA and HOXB13 c.251G>A variant only.   Based on Sarah Parks's family history of cancer, she meets medical criteria for genetic testing. Despite that she meets criteria, she may still have an out of pocket cost. We discussed that if her out of pocket cost for testing is over $100, the laboratory will call and confirm whether she wants to proceed with testing.  If the out of pocket cost of testing is less than $100 she will be billed by the genetic testing laboratory.   Based on the patient's family history, a statistical model (Tyrer Cusik) was used to estimate her risk of developing breast cancer. This estimates her lifetime risk of developing breast cancer to be approximately 28%. This estimation does not consider any genetic testing results.  The patient's lifetime breast cancer risk is a preliminary estimate based on available information using one of several models endorsed by the  [place] (ACS). The ACS recommends consideration of breast MRI screening as an adjunct to mammography for patients at high risk (defined as 20% or greater lifetime risk).   Sarah Parks has been determined to be at high risk for breast cancer.  Therefore, we recommend that annual screening with mammography and breast MRI be performed.  We discussed that Sarah Parks should discuss her individual situation with her referring physician and determine a breast cancer screening plan with which they are both comfortable.      PLAN: After considering the risks, benefits, and limitations, Sarah Parks provided informed consent to pursue genetic testing.  A blood draw has been scheduled for November 01, 2019 and the blood sample will be sent to [facility] for analysis of the common hereditary cancer panel. Results should be available within approximately 2-3 weeks' time, at which point they will be disclosed by telephone to Sarah Parks, as will any additional recommendations warranted by these results. Sarah Parks will receive a summary of her genetic counseling visit and a copy of her results once available. This information will also be available in Epic.   Lastly, we encouraged Sarah Parks to remain in contact with cancer genetics annually so that we can continuously update the family history and inform her of any changes in cancer genetics and testing that may be of benefit for this family.   Sarah Parks questions were answered to her satisfaction today. Our contact information was provided should additional questions or concerns arise. Thank you for the referral and allowing [place] to share in the care of your patient.   Tasman Zapata P. Florene [place], [place], [facility] Licensed, Insurance risk surveyor [email or web address] phone: [phone]  The patient was seen for a total of 35 minutes in face-to-face genetic counseling.  This patient was discussed with Drs. Magrinat, Lindi Adie and/or Burr Medico who agrees  with the above.    _______________________________________________________________________ For Office Staff:  Number of people involved in session: 1 Was an Intern/ student involved with case: no

## 2019-11-01 ENCOUNTER — Inpatient Hospital Stay: Payer: No Typology Code available for payment source

## 2019-11-01 ENCOUNTER — Other Ambulatory Visit: Payer: Self-pay

## 2019-11-01 DIAGNOSIS — Z803 Family history of malignant neoplasm of breast: Secondary | ICD-10-CM

## 2019-11-04 LAB — GENETIC SCREENING ORDER

## 2019-11-27 ENCOUNTER — Encounter: Payer: Self-pay | Admitting: Genetic Counselor

## 2019-11-27 ENCOUNTER — Telehealth: Payer: Self-pay | Admitting: Genetic Counselor

## 2019-11-27 ENCOUNTER — Ambulatory Visit: Payer: Self-pay | Admitting: Genetic Counselor

## 2019-11-27 DIAGNOSIS — Z1379 Encounter for other screening for genetic and chromosomal anomalies: Secondary | ICD-10-CM

## 2019-11-27 NOTE — Telephone Encounter (Signed)
Revealed negative genetic testing.  Discussed that we do not know why she has breast cancer or why there is cancer in the family. It could be due to a different gene that we are not testing, or maybe our current technology may not be able to pick something up.  It will be important for her to keep in contact with genetics to keep up with whether additional testing may be needed.  Patient is determined to be at high risk for breast cancer based on TC risk.  Will refer to high risk breast clinic.

## 2019-11-27 NOTE — Progress Notes (Signed)
HPI:  [name] was previously seen in the [facility] due to a family history of breast cancer and concerns regarding a hereditary predisposition to cancer. Please refer to our prior cancer genetics clinic note for more information regarding our discussion, assessment and recommendations, at the time. [name] recent genetic test results were disclosed to her, as were recommendations warranted by these results. These results and recommendations are discussed in more detail below.  CANCER HISTORY:  Oncology History   No history exists.    FAMILY HISTORY:  We obtained a detailed, 4-generation family history.  Significant diagnoses are listed below: Family History  Problem Relation Age of Onset  . Hypertension Brother   . Dementia Maternal Grandmother   . Cancer Maternal Grandfather        unknown  . Breast cancer Mother 53       second dx at 31  . HIV/AIDS Father   . Cancer Maternal Uncle        unknown dx  . Breast cancer Other        MGF's sister  . Colon cancer Neg Hx     The patient has two daughters who are cancer free. She has two maternal half brothers who are cancer free.  Her father died of HIV and her mother is living.  Her father is deceased at age 6. She has reportedly tested negative for any hereditary cancer gene mutations.  He had a brother and sister who also died in their 30's-40's due to drug and/or alcohol abuse.  No information is known about this side of the family.  Her mother was diagnosed with breast cancer at 58 and 32.  She has 12 brothers and sisters, one brother was diagnosed with an unknown cancer.  The maternal grandparents are deceased. The grandmother died at 67 and the grandfather had an unknown cancer.  He had a sister with breast cancer.  [name] is aware of previous family history of genetic testing for hereditary cancer risks. Patient's maternal ancestors are of African American descent, and paternal ancestors are  of African American descent. There is no reported Ashkenazi Jewish ancestry. There is no known consanguinity.     GENETIC TEST RESULTS: Genetic testing reported out on Nov 09, 2019 through the common hereditary cancer panel found no pathogenic mutations. The Common Hereditary Gene Panel offered by [facility] includes sequencing and/or deletion duplication testing of the following 48 genes: APC, ATM, AXIN2, BARD1, BMPR1A, BRCA1, BRCA2, BRIP1, CDH1, CDK4, CDKN2A (p14ARF), CDKN2A (p16INK4a), CHEK2, CTNNA1, DICER1, EPCAM (Deletion/duplication testing only), GREM1 (promoter region deletion/duplication testing only), KIT, MEN1, MLH1, MSH2, MSH3, MSH6, MUTYH, NBN, NF1, NHTL1, PALB2, PDGFRA, PMS2, POLD1, POLE, PTEN, RAD50, RAD51C, RAD51D, RNF43, SDHB, SDHC, SDHD, SMAD4, SMARCA4. STK11, TP53, TSC1, TSC2, and VHL.  The following genes were evaluated for sequence changes only: SDHA and HOXB13 c.251G>A variant only. The test report has been scanned into EPIC and is located under the Molecular Pathology section of the Results Review tab.  A portion of the result report is included below for reference.     We discussed with [name] that because current genetic testing is not perfect, it is possible there may be a gene mutation in one of these genes that current testing cannot detect, but that chance is small.  We also discussed, that there could be another gene that has not yet been discovered, or that we have not yet tested, that is responsible for the cancer diagnoses in the  family. It is also possible there is a hereditary cause for the cancer in the family that [name] did not inherit and therefore was not identified in her testing.  Therefore, it is important to remain in touch with cancer genetics in the future so that we can continue to offer [name] the most up to date genetic testing.   ADDITIONAL GENETIC TESTING: We discussed with [name] that there are other genes that are associated with increased  cancer risk that can be analyzed. Should [name] wish to pursue additional genetic testing, we are happy to discuss and coordinate this testing, at any time.    CANCER SCREENING RECOMMENDATIONS: [name] test result is considered negative (normal). Reportedly, her mother also had recent negative genetic testing as well. This means that we have not identified a hereditary cause for her family history of breast cancer at this time. Most cancers happen by chance and this negative test suggests that her cancer may fall into this category.    While reassuring, this does not definitively rule out a hereditary predisposition to cancer. It is still possible that there could be genetic mutations that are undetectable by current technology. There could be genetic mutations in genes that have not been tested or identified to increase cancer risk.  Therefore, it is recommended she continue to follow the cancer management and screening guidelines provided by her primary healthcare provider.   An individual's cancer risk and medical management are not determined by genetic test results alone. Overall cancer risk assessment incorporates additional factors, including personal medical history, family history, and any available genetic information that may result in a personalized plan for cancer prevention and surveillance  Based on [name] family of cancer, as well as her genetic test results, statistical models (Tyrer Cusik)  and literature data were used to estimate her risk of developing breast cancer. These estimate her lifetime risk of developing breast cancer to be approximately 27.9%.  The patient's lifetime breast cancer risk is a preliminary estimate based on available information using one of several models endorsed by the [place] (ACS). The ACS recommends consideration of breast MRI screening as an adjunct to mammography for patients at high risk (defined as 20% or greater lifetime  risk). A more detailed breast cancer risk assessment can be considered, if clinically indicated.     We, therefore, discussed that it is reasonable for [name] to be followed by a high-risk breast cancer clinic; in addition to a yearly mammogram and physical exam by a healthcare provider, she should discuss the usefulness of an annual breast MRI with the high-risk clinic providers.  We have referred her to the [facility].  RECOMMENDATIONS FOR FAMILY MEMBERS:  Individuals in this family might be at some increased risk of developing cancer, over the general population risk, simply due to the family history of cancer.  We recommended women in this family have a yearly mammogram beginning at age 71, or 71 years younger than the earliest onset of cancer, an annual clinical breast exam, and perform monthly breast self-exams. Women in this family should also have a gynecological exam as recommended by their primary provider. All family members should have a colonoscopy by age 21.  FOLLOW-UP: Lastly, we discussed with Ms. Kibble that cancer genetics is a rapidly advancing field and it is possible that new genetic tests will be appropriate for her and/or her family members in the future. We encouraged her to remain in  contact with cancer genetics on an annual basis so we can update her personal and family histories and let her know of advances in cancer genetics that may benefit this family.   Our contact number was provided. [name] questions were answered to her satisfaction, and she knows she is welcome to call us at anytime with additional questions or concerns.   Roma Kayser, [place], [facility] Licensed, Certified Genetic Counselor [email or web address]

## 2019-12-04 ENCOUNTER — Inpatient Hospital Stay: Payer: No Typology Code available for payment source | Admitting: Hematology and Oncology

## 2019-12-04 ENCOUNTER — Telehealth: Payer: Self-pay | Admitting: Hematology and Oncology

## 2019-12-04 NOTE — Telephone Encounter (Signed)
I received a call from Ms. Westbrook to reschedule her new pt appt w/Dr. Pamelia Hoit. Pt has been rescheduled to see Dr. Pamelia Hoit on 6/8 at 1pm.

## 2019-12-10 ENCOUNTER — Inpatient Hospital Stay: Payer: No Typology Code available for payment source | Attending: Genetic Counselor | Admitting: Hematology and Oncology

## 2019-12-10 DIAGNOSIS — Z1239 Encounter for other screening for malignant neoplasm of breast: Secondary | ICD-10-CM | POA: Insufficient documentation

## 2019-12-10 NOTE — Assessment & Plan Note (Deleted)
Tyrer Cusick model: 10-year risk: 2.7% (average risk 1.2%), lifetime risk 27.9% (population risk: 13.1%) Genetic testing: Negative for mutations.  Risk reduction: We discussed pharmacological and nonpharmacological risk reduction measures. We discussed the pros and cons of tamoxifen in reducing her risk. We also discussed exercise, diet, healthy eating habits, reducing alcohol consumption, and role of supplements in reduction of breast cancer risk.  Tamoxifen counseling: We discussed the risks and benefits of tamoxifen. These include but not limited to insomnia, hot flashes, mood changes, vaginal dryness, and weight gain. Although rare, serious side effects including endometrial cancer, risk of blood clots were also discussed. We strongly believe that the benefits far outweigh the risks. Patient understands these risks and consented to starting treatment. Planned treatment duration is 5 years.   Breast cancer surveillance: 1.  Annual mammograms 2.  Annual breast MRIs

## 2019-12-27 ENCOUNTER — Telehealth (INDEPENDENT_AMBULATORY_CARE_PROVIDER_SITE_OTHER): Payer: No Typology Code available for payment source | Admitting: Physician Assistant

## 2019-12-27 ENCOUNTER — Encounter: Payer: Self-pay | Admitting: Physician Assistant

## 2019-12-27 VITALS — Ht 68.5 in | Wt 181.0 lb

## 2019-12-27 DIAGNOSIS — H9202 Otalgia, left ear: Secondary | ICD-10-CM | POA: Diagnosis not present

## 2019-12-27 MED ORDER — AMOXICILLIN 875 MG PO TABS
875.0000 mg | ORAL_TABLET | Freq: Two times a day (BID) | ORAL | 0 refills | Status: DC
Start: 2019-12-27 — End: 2020-12-14

## 2019-12-27 MED ORDER — FLUTICASONE PROPIONATE 50 MCG/ACT NA SUSP: 2.0000 | Freq: Every day | NASAL | 2 refills | Status: DC

## 2019-12-27 NOTE — Progress Notes (Signed)
Virtual Visit via Video   I connected with Sarah Parks on 12/27/19 at  8:30 AM EDT by a video enabled telemedicine application and verified that I am speaking with the correct person using two identifiers. Location patient: Home Location provider:  [facility], Office Persons participating in the virtual visit: Sarah, [place] PA-C  I discussed the limitations of evaluation and management by telemedicine and the availability of in person appointments. The patient expressed understanding and agreed to proceed.  Subjective:   HPI:  Ear pain Past couple of days -- mostly L ear and some possible sore throat. Both of her daughters have had ear infections in the past 6 weeks and both were treated with oral abx. She woke up today with significant nasal congestion. She is taking ibuprofen and tylenol. Felt cold all day but denies fevers/chills.  Denies: cough, SOB, chest pain, trouble swallowing  ROS: See pertinent positives and negatives per HPI.  Patient Active Problem List   Diagnosis Date Noted  . Breast cancer screening, high risk patient 12/10/2019  . Genetic testing 11/27/2019  . Constipation 01/24/2019  . Vitamin B12 deficiency 04/27/2017  . Back pain 01/27/2017  . Family history of malignant neoplasm of breast 07/28/2016  . Cholestasis of pregnancy in third trimester 06/12/2015  . Missed abortion 05/14/2014    Social History   Tobacco Use  . Smoking status: Never Smoker  . Smokeless tobacco: Never Used  Substance Use Topics  . Alcohol use: Yes    Alcohol/week: 3.0 standard drinks    Types: 1 Cans of beer, 2 Shots of liquor per week    Current Outpatient Medications:  .  Cyanocobalamin (VITAMIN B-12 PO), Take 1 each by mouth daily., Disp: , Rfl:  .  ELDERBERRY PO, Take by mouth., Disp: , Rfl:  .  amoxicillin (AMOXIL) 875 MG tablet, Take 1 tablet (875 mg total) by mouth 2 (two) times daily., Disp: 20 tablet, Rfl: 0 .  fluticasone (FLONASE) 50  MCG/ACT nasal spray, Place 2 sprays into both nostrils daily., Disp: 16 g, Rfl: 2 .  Multiple Vitamin (MULTIVITAMIN ADULT PO), Take by mouth. (Patient not taking: Reported on 12/27/2019), Disp: , Rfl:   No Known Allergies  Objective:   VITALS: Per patient if applicable, see vitals. GENERAL: Alert, appears well and in no acute distress. HEENT: Atraumatic, conjunctiva clear, no obvious abnormalities on inspection of external nose and ears. NECK: Normal movements of the head and neck. CARDIOPULMONARY: No increased WOB. Speaking in clear sentences. I:E ratio WNL.  MS: Moves all visible extremities without noticeable abnormality. PSYCH: Pleasant and cooperative, well-groomed. Speech normal rate and rhythm. Affect is appropriate. Insight and judgement are appropriate. Attention is focused, linear, and appropriate.  NEURO: CN grossly intact. Oriented as arrived to appointment on time with no prompting. Moves both UE equally.  SKIN: No obvious lesions, wounds, erythema, or cyanosis noted on face or hands.  Assessment and Plan:   Sarah Parks was seen today for ear pain.  Diagnoses and all orders for this visit:  Left ear pain No red flags on discussion.  Suspect AOM. Will initiate oral amoxicillin and flonase nasal spray per orders. Discussed taking medications as prescribed. Reviewed return precautions including fever, SOB, worsening cough or other concerns. Push fluids and rest. I recommend that patient follow-up if symptoms worsen or persist despite treatment x 7-10 days, sooner if needed.   Other orders -     amoxicillin (AMOXIL) 875 MG tablet; Take 1 tablet (875 mg  total) by mouth 2 (two) times daily. -     fluticasone (FLONASE) 50 MCG/ACT nasal spray; Place 2 sprays into both nostrils daily.    . Reviewed expectations re: course of current medical issues. . Discussed self-management of symptoms. . Outlined signs and symptoms indicating need for more acute intervention. . Patient  verbalized understanding and all questions were answered. Marland Kitchen Health Maintenance issues including appropriate healthy diet, exercise, and smoking avoidance were discussed with patient. . See orders for this visit as documented in the electronic medical record.  I discussed the assessment and treatment plan with the patient. The patient was provided an opportunity to ask questions and all were answered. The patient agreed with the plan and demonstrated an understanding of the instructions.   The patient was advised to call back or seek an in-person evaluation if the symptoms worsen or if the condition fails to improve as anticipated.   CMA or LPN served as scribe during this visit. History, Physical, and Plan performed by medical provider. The above documentation has been reviewed and is accurate and complete.  [place], [place] 12/27/2019

## 2020-11-03 ENCOUNTER — Telehealth: Payer: Self-pay | Admitting: Nurse Practitioner

## 2020-11-03 NOTE — Telephone Encounter (Signed)
Pt is wanting a TOC from Sarah Parks, Sarah Parks, Sarah Parks at [facility] to [facility]. [facility] is much closer to her house. Is this OK?

## 2020-11-03 NOTE — Telephone Encounter (Signed)
ok 

## 2020-11-03 NOTE — Telephone Encounter (Signed)
Ok with me 

## 2020-11-03 NOTE — Telephone Encounter (Signed)
I called pt and left a vm to give [facility] a cb for a TOC appointment.

## 2020-11-03 NOTE — Telephone Encounter (Signed)
Pt called back and scheduled an appointment

## 2020-12-14 ENCOUNTER — Ambulatory Visit (INDEPENDENT_AMBULATORY_CARE_PROVIDER_SITE_OTHER): Payer: No Typology Code available for payment source | Admitting: Nurse Practitioner

## 2020-12-14 ENCOUNTER — Other Ambulatory Visit: Payer: Self-pay

## 2020-12-14 ENCOUNTER — Encounter: Payer: Self-pay | Admitting: Nurse Practitioner

## 2020-12-14 VITALS — BP 110/62 | HR 91 | Temp 98.8°F | Ht 68.5 in | Wt 181.2 lb

## 2020-12-14 DIAGNOSIS — K5904 Chronic idiopathic constipation: Secondary | ICD-10-CM | POA: Diagnosis not present

## 2020-12-14 MED ORDER — LUBIPROSTONE 8 MCG PO CAPS: 8.0000 ug | ORAL_CAPSULE | Freq: Every day | ORAL | 5 refills | Status: DC

## 2020-12-14 NOTE — Patient Instructions (Signed)
Maintain adequate oral hydration. Increase daily fiber intake: fruits and vegetables or benefiber or metamucil or chia seeds Start daily probiotics May try magnesium gluconate OTC supplement.  If above dose not help, start

## 2020-12-14 NOTE — Assessment & Plan Note (Addendum)
Chronic, ongoing since 2020, gradually worsening, unable to use miralax due to bloating. Some improvement with OTC laxative, coffee and cleanse tea. Sometimes improve with menstrual cycle. No Fhx of colon cancer. CT ABD/pelvis done 04/2019: normal.  Advised to Maintain adequate oral hydration. Increase daily fiber intake: fruits and vegetables or benefiber or metamucil or chia seeds Start daily probiotics May try magnesium gluconate OTC supplement. If above dose not help, start amitiza If no improvement, you will need referral to GI

## 2020-12-14 NOTE — Progress Notes (Signed)
Subjective:  Patient ID: Sarah Parks, female    DOB: 09-Aug-1980  Age: 40 y.o. MRN: [id number]  CC: Constipation (Dry hard pebbles/)  Constipation This is a chronic problem. The current episode started more than 1 year ago. The problem has been gradually worsening since onset. Her stool frequency is 2 to 3 times per week. The stool is described as pellet like. The patient is not on a high fiber diet. She Exercises regularly. There has Been adequate water intake. Associated symptoms include abdominal pain, bloating, flatus and nausea. Pertinent negatives include no anorexia, back pain, diarrhea, difficulty urinating, fecal incontinence, fever, hematochezia, hemorrhoids, melena, rectal pain, vomiting or weight loss. She has tried laxatives for the symptoms. The treatment provided moderate relief. There is no history of abdominal surgery, endocrine disease, metabolic disease, neurologic disease or neuromuscular disease.   Reviewed past Medical, Social and Family history today.  Outpatient Medications Prior to Visit  Medication Sig Dispense Refill   Cyanocobalamin (VITAMIN B-12 PO) Take 1 each by mouth daily.     ELDERBERRY PO Take by mouth.     fluticasone (FLONASE) 50 MCG/ACT nasal spray Place 2 sprays into both nostrils daily. (Patient not taking: Reported on 12/14/2020) 16 g 2   amoxicillin (AMOXIL) 875 MG tablet Take 1 tablet (875 mg total) by mouth 2 (two) times daily. 20 tablet 0   Multiple Vitamin (MULTIVITAMIN ADULT PO) Take by mouth. (Patient not taking: Reported on 12/27/2019)     No facility-administered medications prior to visit.    ROS See HPI  Objective:  BP 110/62   Pulse 91   Temp 98.8 F (37.1 C)   Ht 5' 8.5" (1.74 m)   Wt 181 lb 3.2 oz (82.2 kg)   SpO2 99%   BMI 27.15 kg/m   Physical Exam Cardiovascular:     Rate and Rhythm: Normal rate.     Pulses: Normal pulses.  Pulmonary:     Effort: Pulmonary effort is normal.  Abdominal:     General: Bowel sounds  are normal. There is no distension.     Palpations: Abdomen is soft.     Tenderness: There is abdominal tenderness. There is no guarding.  Neurological:     Mental Status: She is alert and oriented to person, place, and time.  Psychiatric:        Mood and Affect: Mood normal.        Behavior: Behavior normal.        Thought Content: Thought content normal.    Assessment & Plan:  This visit occurred during the SARS-CoV-2 public health emergency.  Safety protocols were in place, including screening questions prior to the visit, additional usage of staff PPE, and extensive cleaning of exam room while observing appropriate contact time as indicated for disinfecting solutions.   Sarah Parks was seen today for constipation.  Diagnoses and all orders for this visit:  Chronic idiopathic constipation -     lubiprostone (AMITIZA) 8 MCG capsule; Take 1 capsule (8 mcg total) by mouth daily with breakfast.   Problem List Items Addressed This Visit       Digestive   Chronic idiopathic constipation - Primary    Chronic, ongoing since 2020, gradually worsening, unable to use miralax due to bloating. Some improvement with OTC laxative, coffee and cleanse tea. Sometimes improve with menstrual cycle. No Fhx of colon cancer. CT ABD/pelvis done 04/2019: normal.  Advised to Maintain adequate oral hydration. Increase daily fiber intake: fruits and vegetables or benefiber or  metamucil or chia seeds Start daily probiotics May try magnesium gluconate OTC supplement. If above dose not help, start amitiza If no improvement, you will need referral to GI       Relevant Medications   lubiprostone (AMITIZA) 8 MCG capsule    Follow-up: No follow-ups on file.  Sarah Penna, NP

## 2020-12-23 ENCOUNTER — Encounter: Payer: No Typology Code available for payment source | Admitting: Nurse Practitioner

## 2021-02-08 ENCOUNTER — Encounter: Payer: No Typology Code available for payment source | Admitting: Nurse Practitioner

## 2021-02-16 ENCOUNTER — Encounter: Payer: No Typology Code available for payment source | Admitting: Nurse Practitioner

## 2021-03-02 ENCOUNTER — Other Ambulatory Visit: Payer: Self-pay

## 2021-03-02 ENCOUNTER — Other Ambulatory Visit (HOSPITAL_COMMUNITY)
Admission: RE | Admit: 2021-03-02 | Discharge: 2021-03-02 | Disposition: A | Discharge status: Home / Self Care | Payer: No Typology Code available for payment source | Source: Ambulatory Visit | Attending: Nurse Practitioner | Admitting: Nurse Practitioner

## 2021-03-02 ENCOUNTER — Ambulatory Visit (INDEPENDENT_AMBULATORY_CARE_PROVIDER_SITE_OTHER): Payer: No Typology Code available for payment source | Admitting: Nurse Practitioner

## 2021-03-02 ENCOUNTER — Encounter: Payer: Self-pay | Admitting: Nurse Practitioner

## 2021-03-02 VITALS — BP 110/60 | HR 111 | Temp 97.5°F | Ht 68.0 in | Wt 182.2 lb

## 2021-03-02 DIAGNOSIS — Z136 Encounter for screening for cardiovascular disorders: Secondary | ICD-10-CM | POA: Diagnosis not present

## 2021-03-02 DIAGNOSIS — Z0001 Encounter for general adult medical examination with abnormal findings: Secondary | ICD-10-CM | POA: Diagnosis present

## 2021-03-02 DIAGNOSIS — Z113 Encounter for screening for infections with a predominantly sexual mode of transmission: Secondary | ICD-10-CM | POA: Diagnosis present

## 2021-03-02 DIAGNOSIS — Z13220 Encounter for screening for lipoid disorders: Secondary | ICD-10-CM | POA: Diagnosis not present

## 2021-03-02 DIAGNOSIS — Z124 Encounter for screening for malignant neoplasm of cervix: Secondary | ICD-10-CM | POA: Diagnosis present

## 2021-03-02 DIAGNOSIS — H9193 Unspecified hearing loss, bilateral: Secondary | ICD-10-CM

## 2021-03-02 LAB — COMPREHENSIVE METABOLIC PANEL
ALT: 17 U/L (ref 0–35)
AST: 18 U/L (ref 0–37)
Albumin: 4.3 g/dL (ref 3.5–5.2)
Alkaline Phosphatase: 55 U/L (ref 39–117)
BUN: 5 mg/dL — ABNORMAL LOW (ref 6–23)
CO2: 27 mEq/L (ref 19–32)
Calcium: 9.6 mg/dL (ref 8.4–10.5)
Chloride: 106 mEq/L (ref 96–112)
Creatinine, Ser: 0.92 mg/dL (ref 0.40–1.20)
GFR: 78.85 mL/min (ref >60.00)
Glucose, Bld: 84 mg/dL (ref 70–99)
Potassium: 4.2 mEq/L (ref 3.5–5.1)
Sodium: 139 mEq/L (ref 135–145)
Total Bilirubin: 0.4 mg/dL (ref 0.2–1.2)
Total Protein: 7.4 g/dL (ref 6.0–8.3)

## 2021-03-02 LAB — CBC WITH DIFFERENTIAL/PLATELET
Basophils Absolute: 0 10*3/uL (ref 0.0–0.1)
Basophils Relative: 0.2 % (ref 0.0–3.0)
Eosinophils Absolute: 0.1 10*3/uL (ref 0.0–0.7)
Eosinophils Relative: 1.4 % (ref 0.0–5.0)
HCT: 42.2 % (ref 36.0–46.0)
Hemoglobin: 14 g/dL (ref 12.0–15.0)
Lymphocytes Relative: 28.8 % (ref 12.0–46.0)
Lymphs Abs: 1.8 10*3/uL (ref 0.7–4.0)
MCHC: 33.2 g/dL (ref 30.0–36.0)
MCV: 99.7 fl (ref 78.0–100.0)
Monocytes Absolute: 0.4 10*3/uL (ref 0.1–1.0)
Monocytes Relative: 7.1 % (ref 3.0–12.0)
Neutro Abs: 3.8 10*3/uL (ref 1.4–7.7)
Neutrophils Relative %: 62.5 % (ref 43.0–77.0)
Platelets: 298 10*3/uL (ref 150.0–400.0)
RBC: 4.23 Mil/uL (ref 3.87–5.11)
RDW: 13.1 % (ref 11.5–15.5)
WBC: 6.1 10*3/uL (ref 4.0–10.5)

## 2021-03-02 LAB — LIPID PANEL
Cholesterol: 168 mg/dL (ref 0–200)
HDL: 50.7 mg/dL (ref >39.00)
LDL Cholesterol: 104 mg/dL — ABNORMAL HIGH (ref 0–99)
NonHDL: 117.19
Total CHOL/HDL Ratio: 3
Triglycerides: 64 mg/dL (ref 0.0–149.0)
VLDL: 12.8 mg/dL (ref 0.0–40.0)

## 2021-03-02 LAB — TSH: TSH: 0.39 u[IU]/mL (ref 0.35–5.50)

## 2021-03-02 NOTE — Patient Instructions (Addendum)
Go to lab for blood draw  Let me know if your mother's breast cancer was estrogen positive.  Normal audiometry today. Entered referral to ENT  Preventive Care 72-39 Years Old, Female Preventive care refers to lifestyle choices and visits with your health care provider that can promote health and wellness. This includes: A yearly physical exam. This is also called an annual wellness visit. Regular dental and eye exams. Immunizations. Screening for certain conditions. Healthy lifestyle choices, such as: Eating a healthy diet. Getting regular exercise. Not using drugs or products that contain nicotine and tobacco. Limiting alcohol use. What can I expect for my preventive care visit? Physical exam Your health care provider may check your: Height and weight. These may be used to calculate your BMI (body mass index). BMI is a measurement that tells if you are at a healthy weight. Heart rate and blood pressure. Body temperature. Skin for abnormal spots. Counseling Your health care provider may ask you questions about your: Past medical problems. Family's medical history. Alcohol, tobacco, and drug use. Emotional well-being. Home life and relationship well-being. Sexual activity. Diet, exercise, and sleep habits. Work and work Statistician. Access to firearms. Method of birth control. Menstrual cycle. Pregnancy history. What immunizations do I need?  Vaccines are usually given at various ages, according to a schedule. Your health care provider will recommend vaccines for you based on your age, medicalhistory, and lifestyle or other factors, such as travel or where you work. What tests do I need?  Blood tests Lipid and cholesterol levels. These may be checked every 5 years starting at age 62. Hepatitis C test. Hepatitis B test. Screening Diabetes screening. This is done by checking your blood sugar (glucose) after you have not eaten for a while (fasting). STD (sexually  transmitted disease) testing, if you are at risk. BRCA-related cancer screening. This may be done if you have a family history of breast, ovarian, tubal, or peritoneal cancers. Pelvic exam and Pap test. This may be done every 3 years starting at age 10. Starting at age 37, this may be done every 5 years if you have a Pap test in combination with an HPV test. Talk with your health care provider about your test results, treatment options,and if necessary, the need for more tests. Follow these instructions at home: Eating and drinking  Eat a healthy diet that includes fresh fruits and vegetables, whole grains, lean protein, and low-fat dairy products. Take vitamin and mineral supplements as recommended by your health care provider. Do not drink alcohol if: Your health care provider tells you not to drink. You are pregnant, may be pregnant, or are planning to become pregnant. If you drink alcohol: Limit how much you have to 0-1 drink a day. Be aware of how much alcohol is in your drink. In the [place] one drink equals one 12 oz bottle of beer (355 mL), one 5 oz glass of wine (148 mL), or one 1 oz glass of hard liquor (44 mL).  Lifestyle Take daily care of your teeth and gums. Brush your teeth every morning and night with fluoride toothpaste. Floss one time each day. Stay active. Exercise for at least 30 minutes 5 or more days each week. Do not use any products that contain nicotine or tobacco, such as cigarettes, e-cigarettes, and chewing tobacco. If you need help quitting, ask your health care provider. Do not use drugs. If you are sexually active, practice safe sex. Use a condom or other form of protection to prevent STIs (  sexually transmitted infections). If you do not wish to become pregnant, use a form of birth control. If you plan to become pregnant, see your health care provider for a prepregnancy visit. Find healthy ways to cope with stress, such as: Meditation, yoga, or listening to  music. Journaling. Talking to a trusted person. Spending time with friends and family. Safety Always wear your seat belt while driving or riding in a vehicle. Do not drive: If you have been drinking alcohol. Do not ride with someone who has been drinking. When you are tired or distracted. While texting. Wear a helmet and other protective equipment during sports activities. If you have firearms in your house, make sure you follow all gun safety procedures. Seek help if you have been physically or sexually abused. What's next? Go to your health care provider once a year for an annual wellness visit. Ask your health care provider how often you should have your eyes and teeth checked. Stay up to date on all vaccines. This information is not intended to replace advice given to you by your health care provider. Make sure you discuss any questions you have with your healthcare provider. Document Revised: 02/16/2020 Document Reviewed: 03/01/2018 Elsevier Patient Education  2022 [employer].

## 2021-03-02 NOTE — Progress Notes (Signed)
Subjective:    Patient ID: Sarah Parks, female    DOB: 04-01-1981, 40 y.o.   MRN: [id number]  Patient presents today for CPE and  eval of decreased hearing  HPI Reports decreased hearing since COVID infection 01/2021, has difficulty with low frequency sounds. Denies any dizziness or tinnitus or headache  Vision:up to date Hearing Screening   125Hz  250Hz  500Hz  1000Hz  2000Hz  3000Hz  4000Hz  5000Hz  6000Hz  8000Hz   Right ear n/a n/a y y y y y y y y  Left ear n/a n/a y y y y y y y y    Dental:up to date . Exercise:none Weight:  Wt Readings from Last 3 Encounters:  03/02/21 182 lb 3.2 oz (82.6 kg)  12/14/20 181 lb 3.2 oz (82.2 kg)  12/27/19 181 lb (82.1 kg)    Sexual History (orientation,birth control, marital status, STD):agreed to pelvic and breast exam today. Also agreed to STD screen.   Depression/Suicide: Depression screen [facility] 2/9 03/02/2021 10/23/2019 08/01/2018 02/01/2017  Decreased Interest 0 0 0 0  Down, Depressed, Hopeless 0 0 0 0  PHQ - 2 Score 0 0 0 0  Altered sleeping 1 - 0 -  Tired, decreased energy 0 - 1 -  Change in appetite 0 - 0 -  Feeling bad or failure about yourself  0 - 0 -  Trouble concentrating 0 - 0 -  Moving slowly or fidgety/restless 0 - 0 -  Suicidal thoughts 0 - 0 -  PHQ-9 Score 1 - 1 -  Difficult doing work/chores Not difficult at all - Not difficult at all -   Immunizations: (TDAP, Hep C screen, Pneumovax, Influenza, zoster)  Health Maintenance  Topic Date Due   COVID-19 Vaccine (1) Never done   Hepatitis C Screening: USPSTF Recommendation to screen - Ages 22-79 yo.  Never done   Tetanus Vaccine  07/05/2020   Pap Smear  10/23/2022   HIV Screening  Completed   Pneumococcal Vaccination  Aged Out   HPV Vaccine  Aged Out   Flu Shot  Discontinued   Fall Risk: Fall Risk  12/14/2020 08/01/2018  Falls in the past year? 0 0  Number falls in past yr: 0 0  Injury with Fall? 0 0   Medications and allergies reviewed with patient and  updated if appropriate.  Patient Active Problem List   Diagnosis Date Noted   Breast cancer screening, high risk patient 12/10/2019   Genetic testing 11/27/2019   Chronic idiopathic constipation 01/24/2019   Vitamin B12 deficiency 04/27/2017   Back pain 01/27/2017   Family history of malignant neoplasm of breast 07/28/2016   Missed abortion 05/14/2014    Current Outpatient Medications on File Prior to Visit  Medication Sig Dispense Refill   Cyanocobalamin (VITAMIN B-12 PO) Take 1 each by mouth daily.     ELDERBERRY PO Take by mouth.     lubiprostone (AMITIZA) 8 MCG capsule Take 1 capsule (8 mcg total) by mouth daily with breakfast. 30 capsule 5   fluticasone (FLONASE) 50 MCG/ACT nasal spray Place 2 sprays into both nostrils daily. (Patient not taking: Reported on 03/02/2021) 16 g 2   No current facility-administered medications on file prior to visit.    Past Medical History:  Diagnosis Date   Anemia    Cholestasis    SVD (spontaneous vaginal delivery)    x 2    Past Surgical History:  Procedure Laterality Date   BREAST BIOPSY Left    Fibroid tissue 2003   DILATION AND EVACUATION  N/A 05/14/2014   Procedure: DILATATION AND EVACUATION;  Surgeon: Lavina Hammanodd Meisinger, MD;  Location: [facility];  Service: Gynecology;  Laterality: N/A;   DILATION AND EVACUATION N/A 09/27/2017   Procedure: DILATATION AND EVACUATION;  Surgeon: Lavina HammanMeisinger, Todd, MD;  Location: [facility];  Service: Gynecology;  Laterality: N/A;   INDUCED ABORTION     LAPAROSCOPIC BILATERAL SALPINGECTOMY Bilateral 09/27/2017   Procedure: LAPAROSCOPIC BILATERAL SALPINGECTOMY;  Surgeon: Lavina HammanMeisinger, Todd, MD;  Location: [facility];  Service: Gynecology;  Laterality: Bilateral;   WISDOM TOOTH EXTRACTION     Social History   Socioeconomic History   Marital status: Married    Spouse name: Not on file   Number of children: Not on file   Years of education: Not on file   Highest education level: Not on file  Occupational History   Not  on file  Tobacco Use   Smoking status: Never   Smokeless tobacco: Never  Vaping Use   Vaping Use: Never used  Substance and Sexual Activity   Alcohol use: Yes    Alcohol/week: 3.0 standard drinks    Types: 1 Cans of beer, 2 Shots of liquor per week   Drug use: Not Currently    Types: Marijuana   Sexual activity: Yes    Birth control/protection: None  Other Topics Concern   Not on file  Social History Narrative   Not on file   Social Determinants of Health   Financial Resource Strain: Not on file  Food Insecurity: Not on file  Transportation Needs: Not on file  Physical Activity: Not on file  Stress: Not on file  Social Connections: Not on file    Family History  Problem Relation Age of Onset   Hypertension Brother    Dementia Maternal Grandmother    Cancer Maternal Grandfather        unknown   Breast cancer Mother [id number]       second dx at 4063   HIV/AIDS Father    Cancer Maternal Uncle        unknown dx   Breast cancer Other        MGF's sister   Colon cancer Neg Hx        Review of Systems  Constitutional:  Negative for fever, malaise/fatigue and weight loss.  HENT:  Positive for hearing loss. Negative for congestion, ear pain, sinus pain, sore throat and tinnitus.   Eyes:        Negative for visual changes  Respiratory:  Negative for cough and shortness of breath.   Cardiovascular:  Negative for chest pain, palpitations and leg swelling.  Gastrointestinal:  Negative for blood in stool, constipation, diarrhea and heartburn.  Genitourinary:  Negative for dysuria, frequency and urgency.  Musculoskeletal:  Negative for falls, joint pain and myalgias.  Skin:  Negative for rash.  Neurological:  Negative for dizziness, sensory change and headaches.  Endo/Heme/Allergies:  Does not bruise/bleed easily.  Psychiatric/Behavioral:  Negative for depression, substance abuse and suicidal ideas. The patient is not nervous/anxious.    Objective:   Vitals:   03/02/21 1053   BP: 110/60  Pulse: (!) 111  Temp: (!) 97.5 F (36.4 C)  SpO2: 100%    Body mass index is 27.7 kg/m.   Physical Examination:  Physical Exam Exam conducted with a chaperone present.  Constitutional:      General: She is not in acute distress. HENT:     Right Ear: Hearing, tympanic membrane, ear canal and external ear normal. No decreased hearing  noted. No middle ear effusion. There is no impacted cerumen. No mastoid tenderness.     Left Ear: Hearing, tympanic membrane, ear canal and external ear normal. No decreased hearing noted.  No middle ear effusion. There is no impacted cerumen. No mastoid tenderness.     Ears:     Weber exam findings: Does not lateralize.    Right Rinne: AC > BC.    Left Rinne: AC > BC. Eyes:     General: No scleral icterus.    Extraocular Movements: Extraocular movements intact.     Conjunctiva/sclera: Conjunctivae normal.  Cardiovascular:     Rate and Rhythm: Normal rate and regular rhythm.     Pulses: Normal pulses.     Heart sounds: Normal heart sounds.  Pulmonary:     Effort: Pulmonary effort is normal. No respiratory distress.     Breath sounds: Normal breath sounds.  Chest:  Breasts:    Right: Normal.     Left: Normal.  Abdominal:     General: There is no distension.     Palpations: Abdomen is soft.     Hernia: There is no hernia in the left inguinal area or right inguinal area.  Genitourinary:    General: Normal vulva.     Exam position: Supine.     Labia:        Right: No rash, tenderness or lesion.        Left: No rash, tenderness or lesion.      Urethra: No prolapse or urethral pain.     Vagina: Normal. No vaginal discharge.     Cervix: Normal.     Uterus: Normal.      Adnexa: Right adnexa normal and left adnexa normal.  Musculoskeletal:        General: Normal range of motion.     Cervical back: Normal range of motion and neck supple.     Right lower leg: No edema.     Left lower leg: No edema.  Lymphadenopathy:      Cervical: No cervical adenopathy.     Upper Body:     Right upper body: No supraclavicular, axillary or pectoral adenopathy.     Left upper body: No supraclavicular, axillary or pectoral adenopathy.     Lower Body: No right inguinal adenopathy. No left inguinal adenopathy.  Skin:    General: Skin is warm and dry.  Neurological:     Mental Status: She is alert and oriented to person, place, and time.  Psychiatric:        Mood and Affect: Mood normal.        Behavior: Behavior normal.        Thought Content: Thought content normal.    ASSESSMENT and PLAN: This visit occurred during the SARS-CoV-2 public health emergency.  Safety protocols were in place, including screening questions prior to the visit, additional usage of staff PPE, and extensive cleaning of exam room while observing appropriate contact time as indicated for disinfecting solutions.   Star was seen today for annual exam.  Diagnoses and all orders for this visit:  Encounter for preventative adult health care exam with abnormal findings -     Comprehensive metabolic panel -     CBC with Differential/Platelet -     Lipid panel -     TSH -     Cytology - PAP  Encounter for lipid screening for cardiovascular disease -     Lipid panel  Encounter for Papanicolaou smear for cervical cancer  screening -     Cytology - PAP  Decreased hearing of both ears  Screen for STD (sexually transmitted disease) -     HIV Antibody (routine testing w rflx) -     RPR -     Cervicovaginal ancillary only -     Hepatitis C antibody     Problem List Items Addressed This Visit   None Visit Diagnoses     Encounter for preventative adult health care exam with abnormal findings    -  Primary   Relevant Orders   Comprehensive metabolic panel   CBC with Differential/Platelet   Lipid panel   TSH   Cytology - PAP   Encounter for lipid screening for cardiovascular disease       Relevant Orders   Lipid panel   Encounter for  Papanicolaou smear for cervical cancer screening       Relevant Orders   Cytology - PAP   Decreased hearing of both ears       Screen for STD (sexually transmitted disease)       Relevant Orders   HIV Antibody (routine testing w rflx)   RPR   Cervicovaginal ancillary only   Hepatitis C antibody       Follow up: Return in about 1 year (around 03/02/2022) for CPE (fasting).  Alysia Penna, NP

## 2021-03-03 LAB — CYTOLOGY - PAP
Comment: NEGATIVE
High risk HPV: POSITIVE — AB

## 2021-03-03 LAB — RPR: RPR Ser Ql: NONREACTIVE

## 2021-03-03 LAB — HEPATITIS C ANTIBODY
Hepatitis C Ab: NONREACTIVE
SIGNAL TO CUT-OFF: 0.2 (ref <1.00)

## 2021-03-03 LAB — CERVICOVAGINAL ANCILLARY ONLY
Chlamydia: NEGATIVE
Comment: NEGATIVE
Comment: NEGATIVE
Comment: NORMAL
Neisseria Gonorrhea: NEGATIVE
Trichomonas: NEGATIVE

## 2021-03-03 LAB — HIV ANTIBODY (ROUTINE TESTING W REFLEX): HIV 1&2 Ab, 4th Generation: NONREACTIVE

## 2021-06-22 ENCOUNTER — Telehealth: Payer: No Typology Code available for payment source | Admitting: Physician Assistant

## 2021-06-22 DIAGNOSIS — R6889 Other general symptoms and signs: Secondary | ICD-10-CM

## 2021-06-22 MED ORDER — OSELTAMIVIR PHOSPHATE 75 MG PO CAPS
75.0000 mg | ORAL_CAPSULE | Freq: Two times a day (BID) | ORAL | 0 refills | Status: DC
Start: 2021-06-22 — End: 2021-07-07

## 2021-06-22 NOTE — Progress Notes (Signed)
I have spent 5 minutes in review of e-visit questionnaire, review and updating patient chart, medical decision making and response to patient.   Lindsay Straka Cody Hiedi [place], PA-C    

## 2021-06-22 NOTE — Progress Notes (Signed)

## 2021-06-24 ENCOUNTER — Telehealth: Payer: No Typology Code available for payment source | Admitting: Physician Assistant

## 2021-06-24 DIAGNOSIS — R6889 Other general symptoms and signs: Secondary | ICD-10-CM

## 2021-06-24 MED ORDER — ONDANSETRON 4 MG PO TBDP: 4.0000 mg | ORAL_TABLET | Freq: Three times a day (TID) | ORAL | 0 refills | Status: DC | PRN

## 2021-06-24 NOTE — Patient Instructions (Signed)
Denny Peon, thank you for joining Piedad Climes, PA-C for today's virtual visit.  While this provider is not your primary care provider (PCP), if your PCP is located in our provider database this encounter information will be shared with them immediately following your visit.  Consent: (Patient) Denny Peon provided verbal consent for this virtual visit at the beginning of the encounter.  Current Medications:  Current Outpatient Medications:    Cyanocobalamin (VITAMIN B-12 PO), Take 1 each by mouth daily., Disp: , Rfl:    ELDERBERRY PO, Take by mouth., Disp: , Rfl:    fluticasone (FLONASE) 50 MCG/ACT nasal spray, Place 2 sprays into both nostrils daily. (Patient not taking: Reported on 03/02/2021), Disp: 16 g, Rfl: 2   lubiprostone (AMITIZA) 8 MCG capsule, Take 1 capsule (8 mcg total) by mouth daily with breakfast., Disp: 30 capsule, Rfl: 5   oseltamivir (TAMIFLU) 75 MG capsule, Take 1 capsule (75 mg total) by mouth 2 (two) times daily., Disp: 10 capsule, Rfl: 0   Medications ordered in this encounter:  No orders of the defined types were placed in this encounter.    *If you need refills on other medications prior to your next appointment, please contact your pharmacy*  Follow-Up: Call back or seek an in-person evaluation if the symptoms worsen or if the condition fails to improve as anticipated.  Other Instructions Please continue to hydrate and rest. Take the Zofran as directed to help with nausea so you can get more fluids and some food in your system. You can use OTC Zarbees cough syrup as a more natural way to help with cough itself. You can continue warm liquids with some honey as well.  Tylenol for headache or fever. Limit Ibuprofen/Aleve due to stomach upset.   If not improving or anything worsens, I want you to be evaluated in person as discussed.   Bland Diet A bland diet consists of foods that are often soft and do not have a lot of fat, fiber, or extra  seasonings. Foods without fat, fiber, or seasoning are easier for the body to digest. They are also less likely to irritate your mouth, throat, stomach, and other parts of your digestive system. A bland diet is sometimes called a BRAT diet. What is my plan? Your health care provider or food and nutrition specialist (dietitian) may recommend specific changes to your diet to prevent symptoms or to treat your symptoms. These changes may include: Eating small meals often. Cooking food until it is soft enough to chew easily. Chewing your food well. Drinking fluids slowly. Not eating foods that are very spicy, sour, or fatty. Not eating citrus fruits, such as oranges and grapefruit. What do I need to know about this diet? Eat a variety of foods from the bland diet food list. Do not follow a bland diet longer than needed. Ask your health care provider whether you should take vitamins or supplements. What foods can I eat? Grains Hot cereals, such as cream of wheat. Rice. Bread, crackers, or tortillas made from refined white flour. Vegetables Canned or cooked vegetables. Mashed or boiled potatoes. Fruits Bananas. Applesauce. Other types of cooked or canned fruit with the skin and seeds removed, such as canned peaches or pears. Meats and other proteins Scrambled eggs. Creamy peanut butter or other nut butters. Lean, well-cooked meats, such as chicken or fish. Tofu. Soups or broths. Dairy Low-fat dairy products, such as milk, cottage cheese, or yogurt. Beverages Water. Herbal tea. Apple juice. Fats and  oils Mild salad dressings. Canola or olive oil. Sweets and desserts Pudding. Custard. Fruit gelatin. Ice cream. The items listed above may not be a complete list of recommended foods and beverages. Contact a dietitian for more options. What foods are not recommended? Grains Whole grain breads and cereals. Vegetables Raw vegetables. Fruits Raw fruits, especially citrus, berries, or dried  fruits. Dairy Whole fat dairy foods. Beverages Caffeinated drinks. Alcohol. Seasonings and condiments Strongly flavored seasonings or condiments. Hot sauce. Salsa. Other foods Spicy foods. Fried foods. Sour foods, such as pickled or fermented foods. Foods with high sugar content. Foods high in fiber. The items listed above may not be a complete list of foods and beverages to avoid. Contact a dietitian for more information. Summary A bland diet consists of foods that are often soft and do not have a lot of fat, fiber, or extra seasonings. Foods without fat, fiber, or seasoning are easier for the body to digest. Check with your health care provider to see how long you should follow this diet plan. It is not meant to be followed for long periods. This information is not intended to replace advice given to you by your health care provider. Make sure you discuss any questions you have with your health care provider. Document Revised: 07/19/2017 Document Reviewed: 07/19/2017 Elsevier Patient Education  2022 [employer].    If you have been instructed to have an in-person evaluation today at a local Urgent Care facility, please use the link below. It will take you to a list of all of our available [facility], including address, phone number and hours of operation. Please do not delay care.  [facility]  If you or a family member do not have a primary care provider, use the link below to schedule a visit and establish care. When you choose a [facility] primary care physician or advanced practice provider, you gain a long-term partner in health. Find a Primary Care Provider  Learn more about [facility]'s in-office and virtual care options: [facility] - Get Care Now

## 2021-06-24 NOTE — Progress Notes (Signed)
Virtual Visit Consent   Sarah Parks, you are scheduled for a virtual visit with a [facility] provider today.     Just as with appointments in the office, your consent must be obtained to participate.  Your consent will be active for this visit and any virtual visit you may have with one of our providers in the next 365 days.     If you have a MyChart account, a copy of this consent can be sent to you electronically.  All virtual visits are billed to your insurance company just like a traditional visit in the office.    As this is a virtual visit, video technology does not allow for your provider to perform a traditional examination.  This may limit your provider's ability to fully assess your condition.  If your provider identifies any concerns that need to be evaluated in person or the need to arrange testing (such as labs, EKG, etc.), we will make arrangements to do so.     Although advances in technology are sophisticated, we cannot ensure that it will always work on either your end or our end.  If the connection with a video visit is poor, the visit may have to be switched to a telephone visit.  With either a video or telephone visit, we are not always able to ensure that we have a secure connection.     I need to obtain your verbal consent now.   Are you willing to proceed with your visit today?    Sarah Parks has provided verbal consent on 06/24/2021 for a virtual visit (video or telephone).   Piedad Climes, [place]   Date: 06/24/2021 4:54 PM   Virtual Visit via Video Note   I, Piedad Climes, connected with  Sarah Parks  ([id number], 03/09/1981) on 06/24/21 at  4:45 PM EST by a video-enabled telemedicine application and verified that I am speaking with the correct person using two identifiers.  Location: Patient: Virtual Visit Location Patient: Home Provider: Virtual Visit Location Provider: Home Office   I discussed the limitations of evaluation and  management by telemedicine and the availability of in person appointments. The patient expressed understanding and agreed to proceed.    History of Present Illness: Sarah Parks is a 40 y.o. who identifies as a female who was assigned female at birth, and is being seen today for ongoing symptoms after diagnosis of flu. Feels she keeps getting better and then takes a few steps back. Still with more significant body aches. Fever comes and goes -- mostly gone during the day and then present more so at night. Denies chest pain or SOB. URI symptoms have improved. Notes today having more significant nausea causing decreased input and episode of vomiting which she had not had since first day of symptoms. She is hesitant to take medication besides her Tamiflu due to nausea.   HPI: HPI  Problems:  Patient Active Problem List   Diagnosis Date Noted   Breast cancer screening, high risk patient 12/10/2019   Genetic testing 11/27/2019   Chronic idiopathic constipation 01/24/2019   Vitamin B12 deficiency 04/27/2017   Back pain 01/27/2017   Family history of malignant neoplasm of breast 07/28/2016   Missed abortion 05/14/2014    Allergies: No Known Allergies Medications:  Current Outpatient Medications:    ondansetron (ZOFRAN-ODT) 4 MG disintegrating tablet, Take 1 tablet (4 mg total) by mouth every 8 (eight) hours as needed for nausea or vomiting., Disp: 20  tablet, Rfl: 0   Cyanocobalamin (VITAMIN B-12 PO), Take 1 each by mouth daily., Disp: , Rfl:    ELDERBERRY PO, Take by mouth., Disp: , Rfl:    fluticasone (FLONASE) 50 MCG/ACT nasal spray, Place 2 sprays into both nostrils daily. (Patient not taking: Reported on 03/02/2021), Disp: 16 g, Rfl: 2   lubiprostone (AMITIZA) 8 MCG capsule, Take 1 capsule (8 mcg total) by mouth daily with breakfast., Disp: 30 capsule, Rfl: 5   oseltamivir (TAMIFLU) 75 MG capsule, Take 1 capsule (75 mg total) by mouth 2 (two) times daily., Disp: 10 capsule, Rfl:  0  Observations/Objective: Patient is well-developed, well-nourished in no acute distress.  Resting comfortably at home.  Head is normocephalic, atraumatic.  No labored breathing. Speech is clear and coherent with logical content.  Patient is alert and oriented at baseline.   Assessment and Plan: 1. Flu-like symptoms - ondansetron (ZOFRAN-ODT) 4 MG disintegrating tablet; Take 1 tablet (4 mg total) by mouth every 8 (eight) hours as needed for nausea or vomiting.  Dispense: 20 tablet; Refill: 0  URI symptoms improving. Still with some intermittent fever, mainly at night and lower grade, and some aches. Nausea today with one episode of non-bloody emesis. Encourage hydration. Rx Zofran ODT. CAn start easing into a BRAT diet once able. Recommend starting Vitamin D and C as discussed. OTC medications reviewed. Strict ER precautions reviewed with patient.   Follow Up Instructions: I discussed the assessment and treatment plan with the patient. The patient was provided an opportunity to ask questions and all were answered. The patient agreed with the plan and demonstrated an understanding of the instructions.  A copy of instructions were sent to the patient via MyChart unless otherwise noted below.    The patient was advised to call back or seek an in-person evaluation if the symptoms worsen or if the condition fails to improve as anticipated.  Time:  I spent 15 minutes with the patient via telehealth technology discussing the above problems/concerns.    Piedad Climes, PA-C

## 2021-07-07 ENCOUNTER — Ambulatory Visit (INDEPENDENT_AMBULATORY_CARE_PROVIDER_SITE_OTHER): Payer: No Typology Code available for payment source | Admitting: Nurse Practitioner

## 2021-07-07 ENCOUNTER — Encounter: Payer: Self-pay | Admitting: Nurse Practitioner

## 2021-07-07 ENCOUNTER — Ambulatory Visit (INDEPENDENT_AMBULATORY_CARE_PROVIDER_SITE_OTHER)
Admission: RE | Admit: 2021-07-07 | Discharge: 2021-07-07 | Disposition: A | Discharge status: Home / Self Care | Payer: No Typology Code available for payment source | Source: Ambulatory Visit | Attending: Nurse Practitioner | Admitting: Nurse Practitioner

## 2021-07-07 ENCOUNTER — Other Ambulatory Visit: Payer: Self-pay

## 2021-07-07 VITALS — BP 104/80 | HR 97 | Temp 97.6°F | Resp 12 | Ht 68.0 in | Wt 175.2 lb

## 2021-07-07 DIAGNOSIS — R0982 Postnasal drip: Secondary | ICD-10-CM | POA: Diagnosis not present

## 2021-07-07 DIAGNOSIS — H938X3 Other specified disorders of ear, bilateral: Secondary | ICD-10-CM | POA: Diagnosis not present

## 2021-07-07 DIAGNOSIS — R051 Acute cough: Secondary | ICD-10-CM | POA: Diagnosis not present

## 2021-07-07 DIAGNOSIS — Z8709 Personal history of other diseases of the respiratory system: Secondary | ICD-10-CM | POA: Diagnosis not present

## 2021-07-07 IMAGING — DX DG CHEST 2V
2 series · 2 of 2 positions shown · non-contrast
Comparison: [DATE]

CLINICAL DATA: Productive cough

EXAM:
CHEST - 2 VIEW

[chest pa]
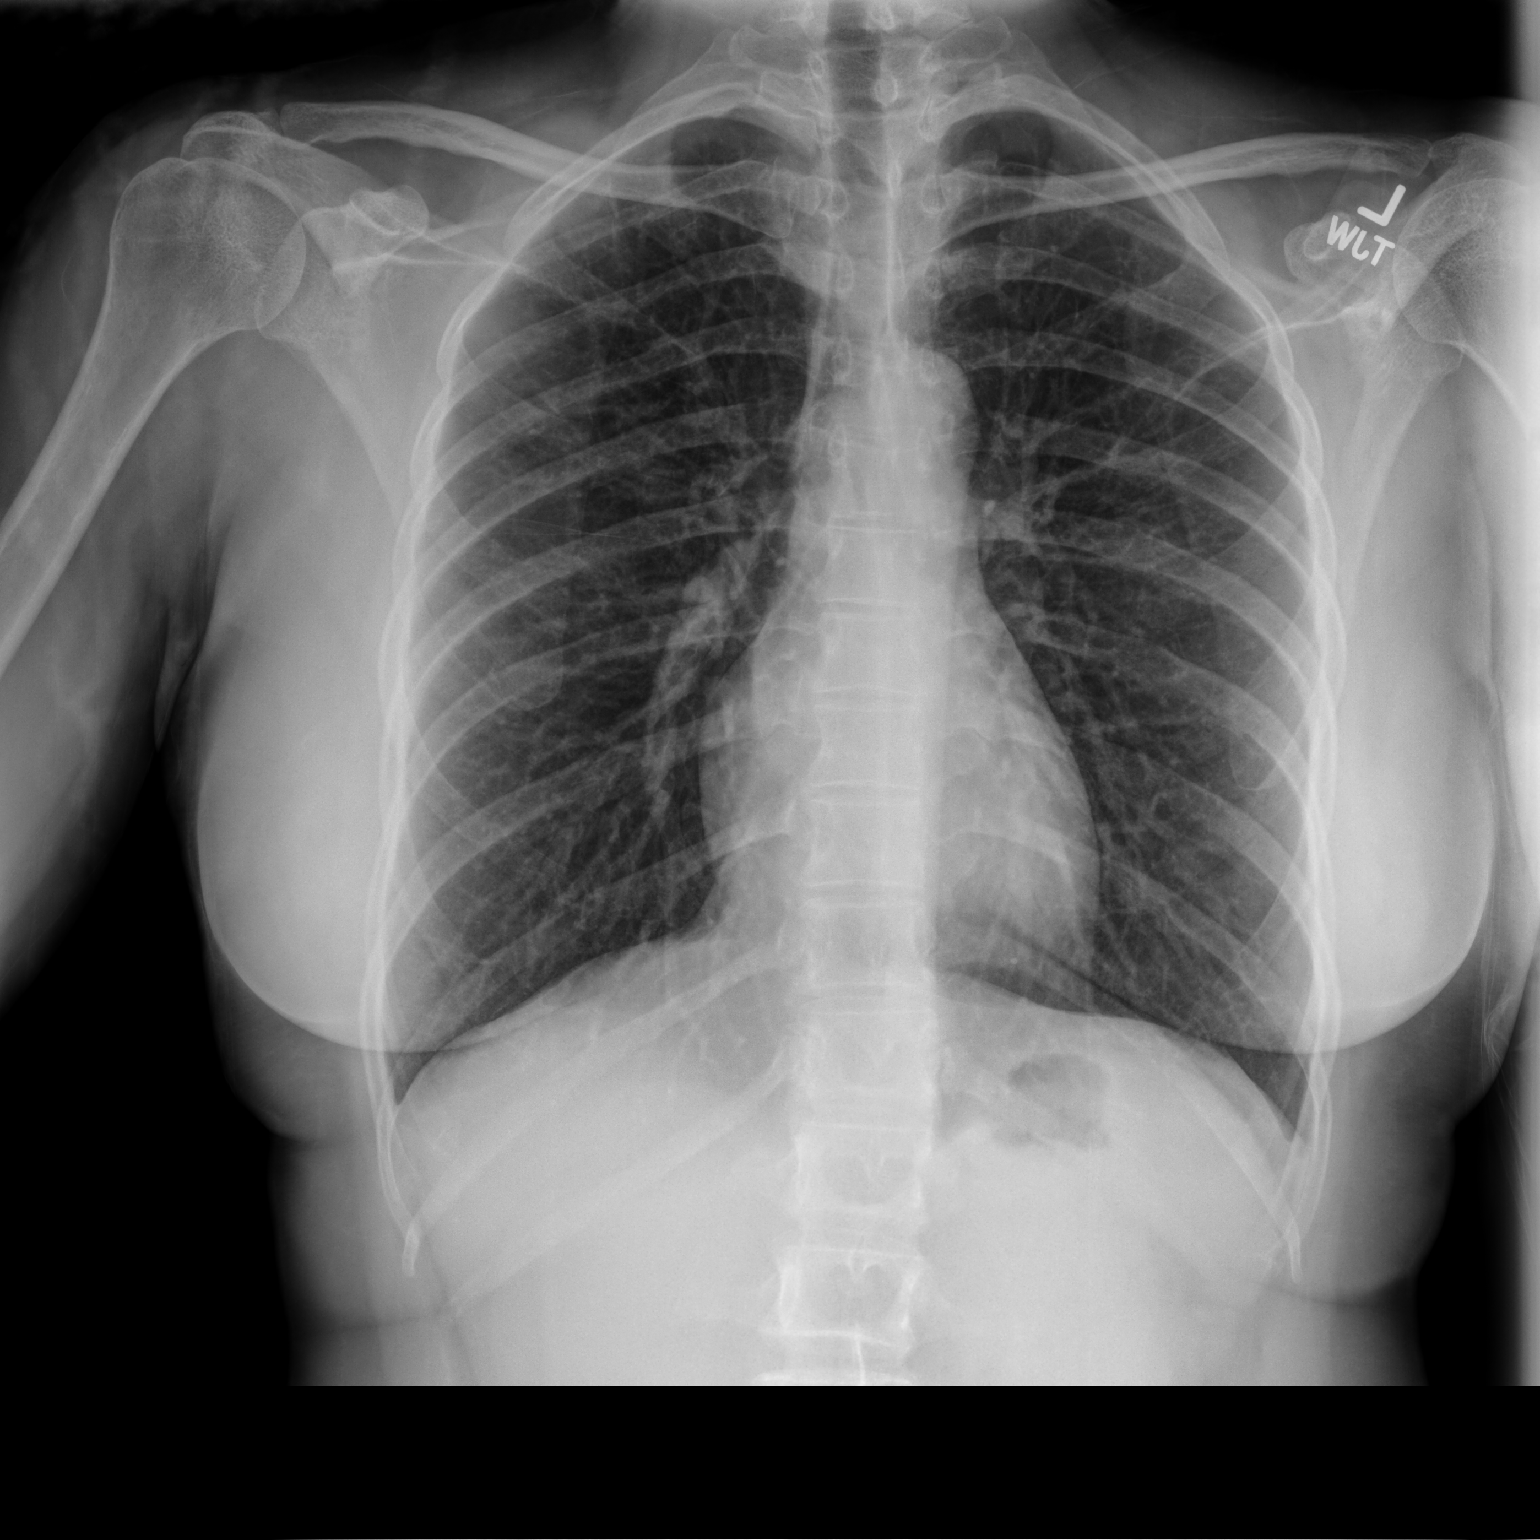

[chest lat]
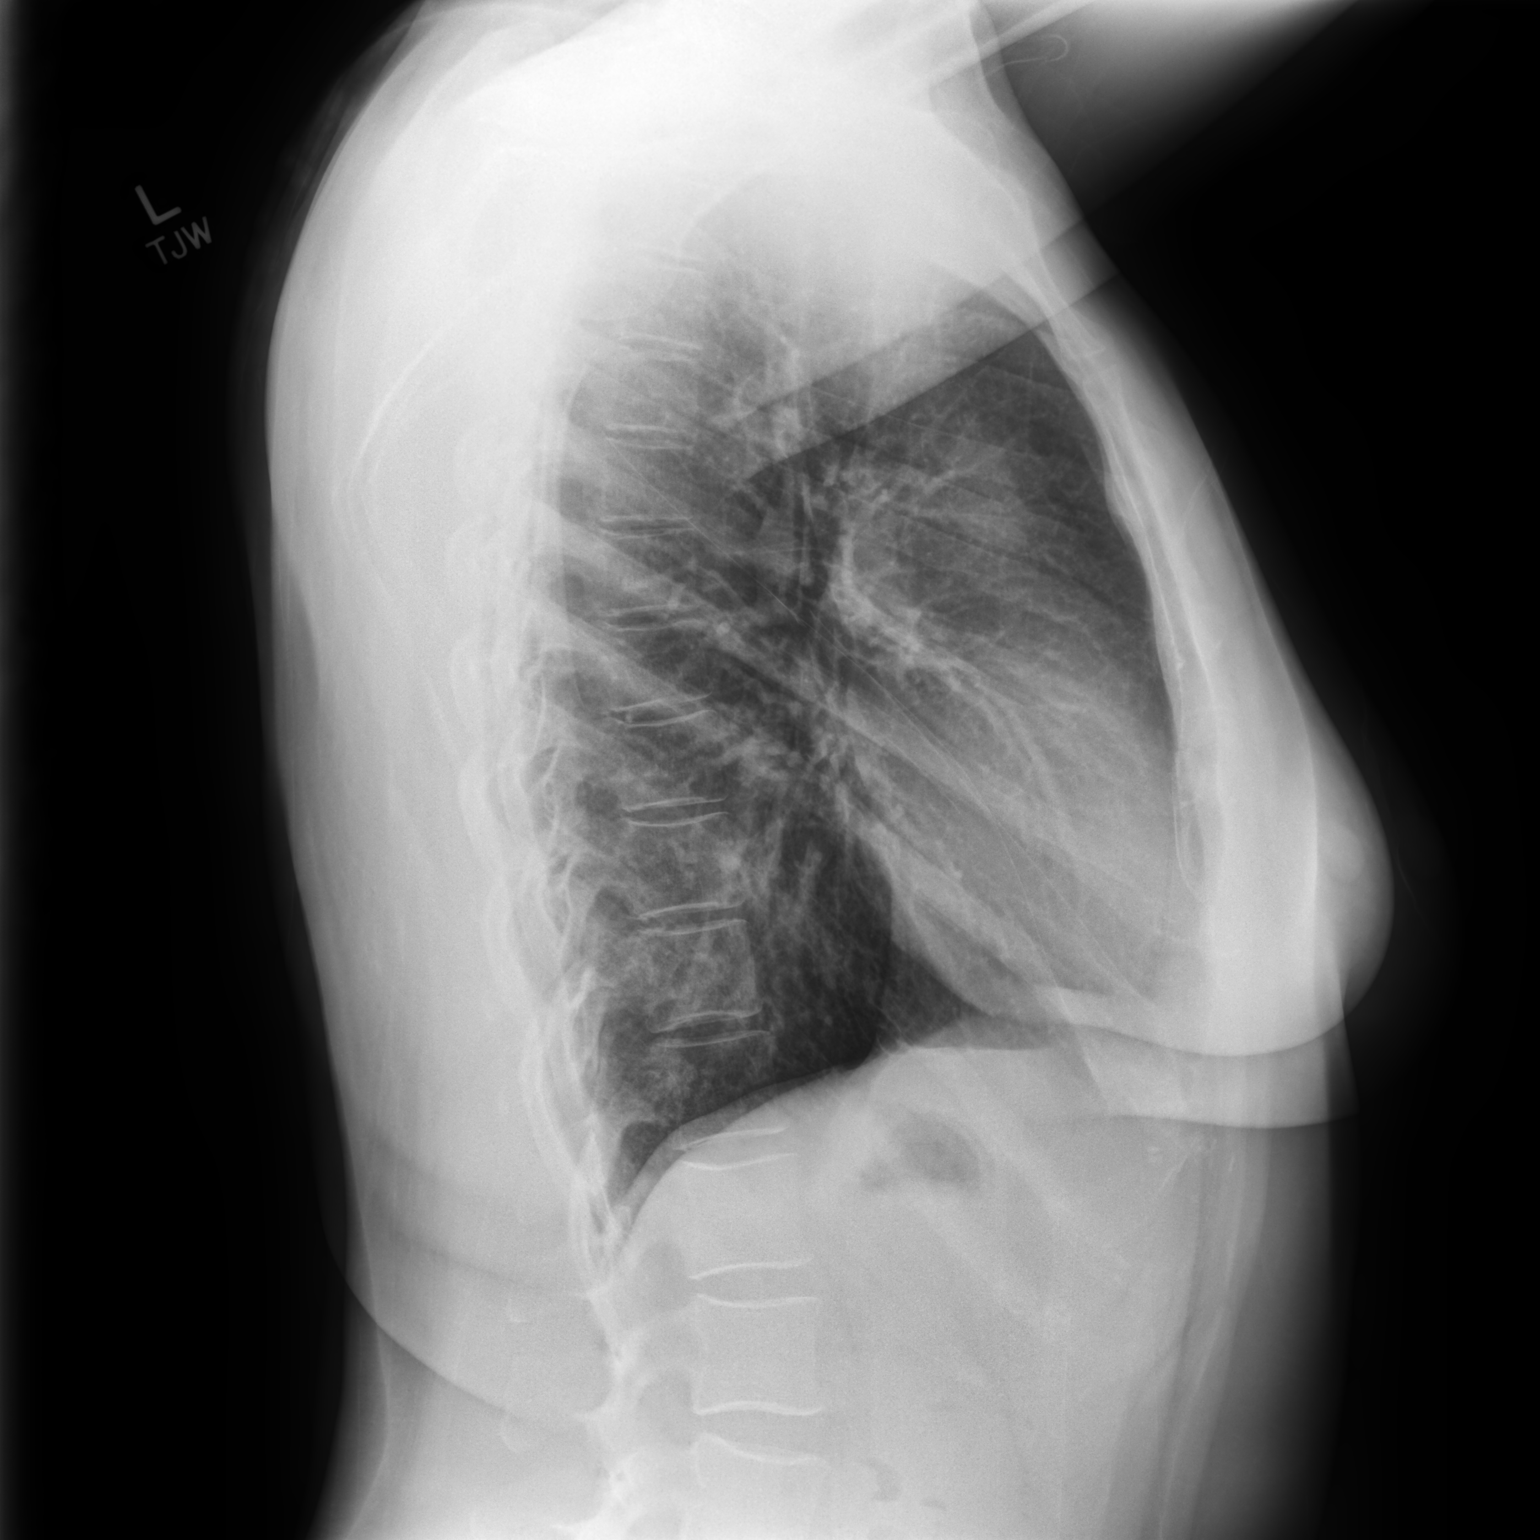

[2 of 2 positions shown; findings below may reference images not displayed]

FINDINGS: Cardiac and mediastinal contours are within normal limits. No focal
pulmonary opacity. No pleural effusion or pneumothorax. No acute
osseous abnormality.
IMPRESSION: No acute cardiopulmonary process.

## 2021-07-07 MED ORDER — LEVOCETIRIZINE DIHYDROCHLORIDE 5 MG PO TABS: 5.0000 mg | ORAL_TABLET | Freq: Every evening | ORAL | 0 refills | Status: DC

## 2021-07-07 MED ORDER — FLUTICASONE PROPIONATE 50 MCG/ACT NA SUSP: 2.0000 | Freq: Every day | NASAL | 0 refills | Status: DC

## 2021-07-07 NOTE — Progress Notes (Signed)
Acute Office Visit  Subjective:    Patient ID: Sarah Parks, female    DOB: 1981/06/19, 41 y.o.   MRN: [id number]  Chief Complaint  Patient presents with   Follow up after flu    Had flu about 2 weeks ago, week before christmas. Felt better but the following symptoms lingering-Coughing up brown/yellow/green phlegm at times, bloody mucus blowing out, some chest soreness.     HPI Patient is in today for flu follow  Flu one week before christmas. States that she is having lingering sympotms  Has been using OTC Robitussine and thera flu. Helped some.      Past Medical History:  Diagnosis Date   Anemia    Cholestasis    SVD (spontaneous vaginal delivery)    x 2    Past Surgical History:  Procedure Laterality Date   BREAST BIOPSY Left    Fibroid tissue 2003   DILATION AND EVACUATION N/A 05/14/2014   Procedure: DILATATION AND EVACUATION;  Surgeon: Cheri Fowler, MD;  Location: [facility];  Service: Gynecology;  Laterality: N/A;   DILATION AND EVACUATION N/A 09/27/2017   Procedure: DILATATION AND EVACUATION;  Surgeon: Cheri Fowler, MD;  Location: [facility];  Service: Gynecology;  Laterality: N/A;   INDUCED ABORTION     LAPAROSCOPIC BILATERAL SALPINGECTOMY Bilateral 09/27/2017   Procedure: LAPAROSCOPIC BILATERAL SALPINGECTOMY;  Surgeon: Cheri Fowler, MD;  Location: [facility];  Service: Gynecology;  Laterality: Bilateral;   WISDOM TOOTH EXTRACTION      Family History  Problem Relation Age of Onset   Hypertension Brother    Dementia Maternal Grandmother    Cancer Maternal Grandfather        unknown   Breast cancer Mother 20       second dx at 82   HIV/AIDS Father    Cancer Maternal Uncle        unknown dx   Breast cancer Other        MGF's sister   Colon cancer Neg Hx     Social History   Socioeconomic History   Marital status: Married    Spouse name: Not on file   Number of children: Not on file   Years of education: Not on file   Highest education level: Not  on file  Occupational History   Not on file  Tobacco Use   Smoking status: Never   Smokeless tobacco: Never  Vaping Use   Vaping Use: Never used  Substance and Sexual Activity   Alcohol use: Yes    Alcohol/week: 3.0 standard drinks    Types: 1 Cans of beer, 2 Shots of liquor per week   Drug use: Not Currently    Types: Marijuana   Sexual activity: Yes    Birth control/protection: None  Other Topics Concern   Not on file  Social History Narrative   Not on file   Social Determinants of Health   Financial Resource Strain: Not on file  Food Insecurity: Not on file  Transportation Needs: Not on file  Physical Activity: Not on file  Stress: Not on file  Social Connections: Not on file  Intimate Partner Violence: Not on file    Outpatient Medications Prior to Visit  Medication Sig Dispense Refill   ELDERBERRY PO Take by mouth.     Cyanocobalamin (VITAMIN B-12 PO) Take 1 each by mouth daily.     fluticasone (FLONASE) 50 MCG/ACT nasal spray Place 2 sprays into both nostrils daily. (Patient not taking: Reported on 03/02/2021)  16 g 2   lubiprostone (AMITIZA) 8 MCG capsule Take 1 capsule (8 mcg total) by mouth daily with breakfast. 30 capsule 5   ondansetron (ZOFRAN-ODT) 4 MG disintegrating tablet Take 1 tablet (4 mg total) by mouth every 8 (eight) hours as needed for nausea or vomiting. 20 tablet 0   oseltamivir (TAMIFLU) 75 MG capsule Take 1 capsule (75 mg total) by mouth 2 (two) times daily. 10 capsule 0   No facility-administered medications prior to visit.    No Known Allergies  Review of Systems  Constitutional:  Positive for fatigue. Negative for chills and fever.  HENT:  Positive for congestion, ear pain (fullness) and postnasal drip. Negative for ear discharge, sinus pressure, sinus pain and sore throat.   Respiratory:  Positive for cough (green and yellow phlegm. Thick) and shortness of breath (DOE and at rest).   Cardiovascular:  Negative for chest pain.   Gastrointestinal:  Negative for abdominal pain, diarrhea, nausea and vomiting.  Musculoskeletal:  Negative for arthralgias and myalgias.  Neurological:  Negative for headaches.     Lmp 1.5 weeks ago. Objective:    Physical Exam Vitals and nursing note reviewed.  Constitutional:      Appearance: Normal appearance.  HENT:     Right Ear: Tympanic membrane, ear canal and external ear normal. There is no impacted cerumen.     Left Ear: Ear canal and external ear normal. There is no impacted cerumen.     Ears:     Comments: Fluid behind left TM    Nose:     Right Sinus: Maxillary sinus tenderness present. No frontal sinus tenderness.     Left Sinus: Maxillary sinus tenderness present. No frontal sinus tenderness.     Mouth/Throat:     Mouth: Mucous membranes are moist.  Cardiovascular:     Rate and Rhythm: Normal rate and regular rhythm.     Heart sounds: Normal heart sounds.  Pulmonary:     Effort: Pulmonary effort is normal.     Breath sounds: Normal breath sounds.  Abdominal:     General: Bowel sounds are normal.  Lymphadenopathy:     Cervical: No cervical adenopathy.  Skin:    General: Skin is warm.  Neurological:     Mental Status: She is alert.    BP 104/80    Pulse 97    Temp 97.6 F (36.4 C)    Resp 12    Ht 5\' 8"  (1.727 m)    Wt 175 lb 4 oz (79.5 kg)    LMP 06/28/2021    SpO2 100%    BMI 26.65 kg/m  Wt Readings from Last 3 Encounters:  07/07/21 175 lb 4 oz (79.5 kg)  03/02/21 182 lb 3.2 oz (82.6 kg)  12/14/20 181 lb 3.2 oz (82.2 kg)    Health Maintenance Due  Topic Date Due   COVID-19 Vaccine (1) Never done   TETANUS/TDAP  07/05/2020    There are no preventive care reminders to display for this patient.   Lab Results  Component Value Date   TSH 0.39 03/02/2021   Lab Results  Component Value Date   WBC 6.1 03/02/2021   HGB 14.0 03/02/2021   HCT 42.2 03/02/2021   MCV 99.7 03/02/2021   PLT 298.0 03/02/2021   Lab Results  Component Value Date   NA  139 03/02/2021   K 4.2 03/02/2021   CO2 27 03/02/2021   GLUCOSE 84 03/02/2021   BUN 5 (L) 03/02/2021   CREATININE  0.92 03/02/2021   BILITOT 0.4 03/02/2021   ALKPHOS 55 03/02/2021   AST 18 03/02/2021   ALT 17 03/02/2021   PROT 7.4 03/02/2021   ALBUMIN 4.3 03/02/2021   CALCIUM 9.6 03/02/2021   ANIONGAP 10 06/12/2015   GFR 78.85 03/02/2021   Lab Results  Component Value Date   CHOL 168 03/02/2021   Lab Results  Component Value Date   HDL 50.70 03/02/2021   Lab Results  Component Value Date   LDLCALC 104 (H) 03/02/2021   Lab Results  Component Value Date   TRIG 64.0 03/02/2021   Lab Results  Component Value Date   CHOLHDL 3 03/02/2021   No results found for: HGBA1C     Assessment & Plan:   Problem List Items Addressed This Visit       Nervous and Auditory   Sensation of fullness in both ears - Primary    Likely related to eustachian tube dysfunction.  Will place patient on antihistamine and topical steroid.      Relevant Medications   fluticasone (FLONASE) 50 MCG/ACT nasal spray   levocetirizine (XYZAL) 5 MG tablet     Other   Acute cough    This viral cough that is still productive.  Patient has no history of lung disease.  Pending chest x-ray      Relevant Orders   DG Chest 2 View (Completed)   History of influenza    Had the lower end Christmas.  She did do E-visit was prescribed Tamiflu along with antiemetic.  Feels better but cough has not went away or improved at all.  Pending chest x-ray      Relevant Orders   DG Chest 2 View (Completed)   PND (post-nasal drip)    Likely has PND from exam and history.  We will start topical steroid Flonase.  Fluticasone precautions reviewed with patient.      Relevant Medications   fluticasone (FLONASE) 50 MCG/ACT nasal spray   levocetirizine (XYZAL) 5 MG tablet     Meds ordered this encounter  Medications   fluticasone (FLONASE) 50 MCG/ACT nasal spray    Sig: Place 2 sprays into both nostrils daily.     Dispense:  16 g    Refill:  0    Order Specific Question:   Supervising Provider    Answer:   Loura Pardon A [[id number]]   levocetirizine (XYZAL) 5 MG tablet    Sig: Take 1 tablet (5 mg total) by mouth every evening.    Dispense:  15 tablet    Refill:  0    Order Specific Question:   Supervising Provider    Answer:   Loura Pardon A [[id number]]   This visit occurred during the SARS-CoV-2 public health emergency.  Safety protocols were in place, including screening questions prior to the visit, additional usage of staff PPE, and extensive cleaning of exam room while observing appropriate contact time as indicated for disinfecting solutions.    Romilda Garret, NP

## 2021-07-07 NOTE — Assessment & Plan Note (Signed)
Likely has PND from exam and history.  We will start topical steroid Flonase.  Fluticasone precautions reviewed with patient.

## 2021-07-07 NOTE — Assessment & Plan Note (Signed)
Likely related to eustachian tube dysfunction.  Will place patient on antihistamine and topical steroid.

## 2021-07-07 NOTE — Assessment & Plan Note (Signed)
This viral cough that is still productive.  Patient has no history of lung disease.  Pending chest x-ray

## 2021-07-07 NOTE — Assessment & Plan Note (Signed)
Had the lower end Christmas.  She did do E-visit was prescribed Tamiflu along with antiemetic.  Feels better but cough has not went away or improved at all.  Pending chest x-ray

## 2021-07-07 NOTE — Patient Instructions (Signed)
Nice to see you today I will be in touch about the chest xray You can try some mucinex for the cough/phelgm. Follow up if not improving

## 2021-10-14 ENCOUNTER — Ambulatory Visit (INDEPENDENT_AMBULATORY_CARE_PROVIDER_SITE_OTHER): Payer: No Typology Code available for payment source

## 2021-10-14 ENCOUNTER — Ambulatory Visit (INDEPENDENT_AMBULATORY_CARE_PROVIDER_SITE_OTHER): Payer: No Typology Code available for payment source | Admitting: Nurse Practitioner

## 2021-10-14 ENCOUNTER — Encounter: Payer: Self-pay | Admitting: Nurse Practitioner

## 2021-10-14 VITALS — BP 124/80 | HR 100 | Temp 98.5°F | Ht 68.0 in | Wt 173.2 lb

## 2021-10-14 DIAGNOSIS — Z1239 Encounter for other screening for malignant neoplasm of breast: Secondary | ICD-10-CM

## 2021-10-14 DIAGNOSIS — R0789 Other chest pain: Secondary | ICD-10-CM

## 2021-10-14 IMAGING — DX DG CHEST 2V
2 series · 2 of 2 positions shown · non-contrast
Comparison: [DATE],

CLINICAL DATA: 40-year-old female with a history of chest wall pain

EXAM:
CHEST - 2 VIEW

[chest pa]
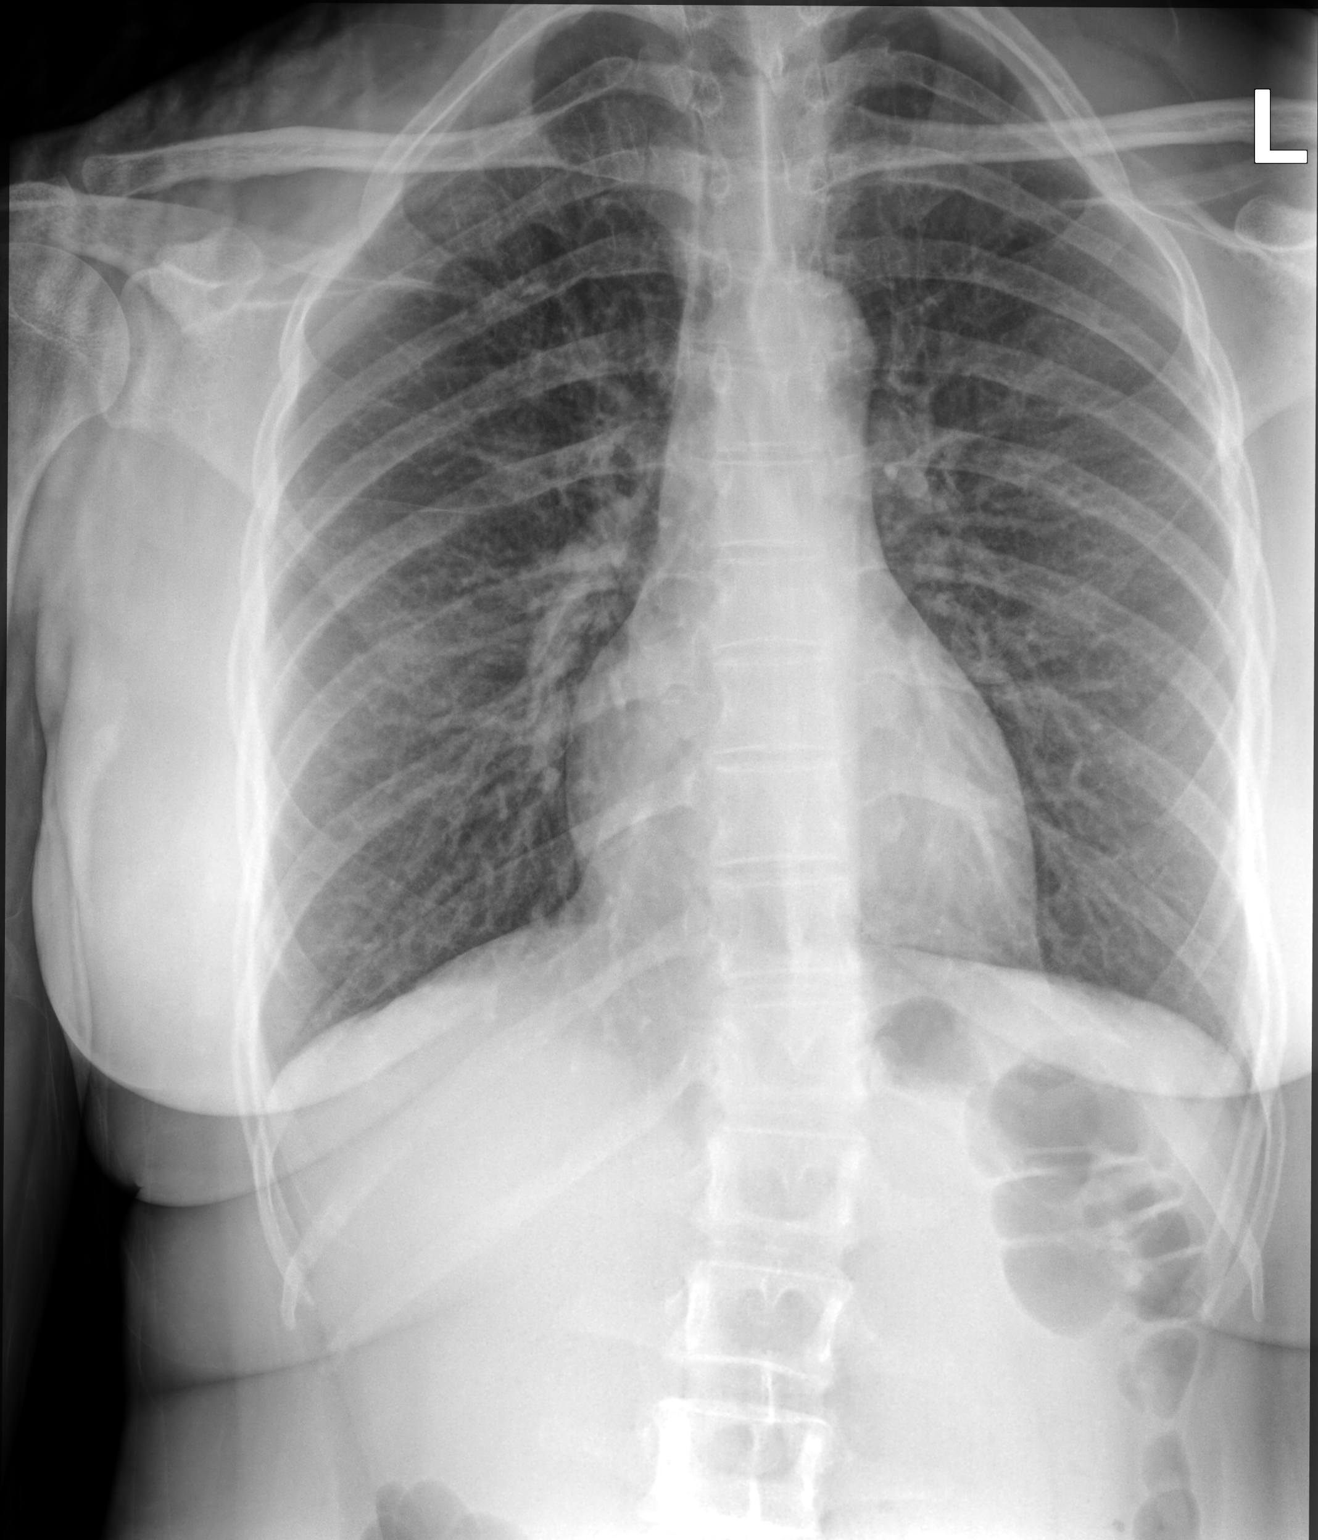

[chest lat]
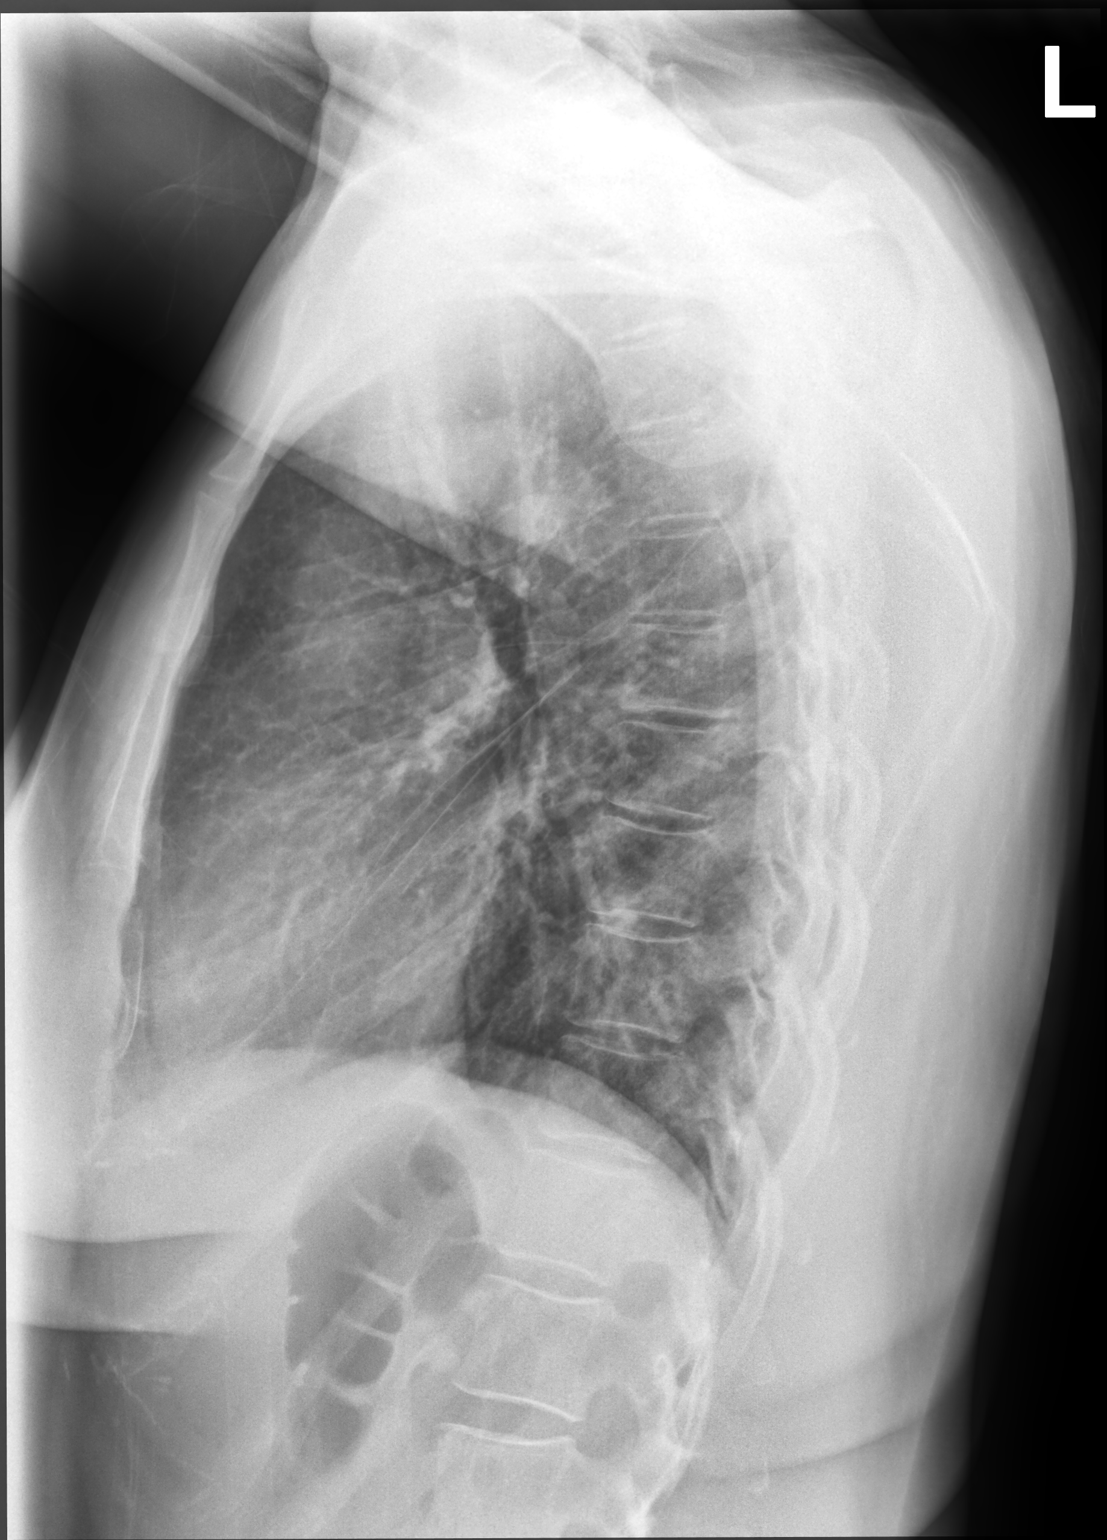

[2 of 2 positions shown; findings below may reference images not displayed]

FINDINGS: Cardiomediastinal silhouette unchanged in size and contour. No
evidence of central vascular congestion. No interlobular septal
thickening. No pneumothorax or pleural effusion. No confluent
airspace disease.

No acute displaced fracture
IMPRESSION: No active cardiopulmonary disease.

## 2021-10-14 NOTE — Patient Instructions (Signed)
It was great to see you! ? ?You can take ibuprofen as needed for pain. You can also try warm compresses. Make sure you are drinking fluids and you can do gentle stretches. We are going to check a chest x-ray.  ? ?I have placed an order for screening mammogram, they should call you to schedule. If you do not hear from them in the next week, please call: ? ?[facility] ?[address] ?[place], [place] ?[phone] ? ?Let's follow-up if your symptoms don't improve or worsen.  ? ?Take care, ? ?Rodman Pickle, NP ? ?

## 2021-10-14 NOTE — Assessment & Plan Note (Signed)
She has a high risk of breast cancer, with her mom having breast cancer twice.  She has had routine mammograms, however has not had one in several years.  Mammogram ordered today. ?

## 2021-10-14 NOTE — Progress Notes (Signed)
? ?Acute Office Visit ? ?Subjective:  ? ? Patient ID: Sarah Parks, female    DOB: December 10, 1980, 41 y.o.   MRN: [id number] ? ?Chief Complaint  ?Patient presents with  ? Acute Visit  ? ? ?HPI ?Patient is in today for sharp pain behind left breast. The pain is random and intermittent. She has been having congestion, post nasal drip recently and coughing. Denies fevers. She has endorsed increased stress recently as well. She denies shortness of breath, wheezing.  She also has high risk of breast cancer, with her mom having it twice.  She has not had a mammogram done recently. ? ?Past Medical History:  ?Diagnosis Date  ? Anemia   ? Cholestasis   ? SVD (spontaneous vaginal delivery)   ? x 2  ? ? ?Past Surgical History:  ?Procedure Laterality Date  ? BREAST BIOPSY Left   ? Fibroid tissue 2003  ? DILATION AND EVACUATION N/A 05/14/2014  ? Procedure: DILATATION AND EVACUATION;  Surgeon: Lavina Hamman, MD;  Location: [facility];  Service: Gynecology;  Laterality: N/A;  ? DILATION AND EVACUATION N/A 09/27/2017  ? Procedure: DILATATION AND EVACUATION;  Surgeon: Lavina Hamman, MD;  Location: [facility];  Service: Gynecology;  Laterality: N/A;  ? INDUCED ABORTION    ? LAPAROSCOPIC BILATERAL SALPINGECTOMY Bilateral 09/27/2017  ? Procedure: LAPAROSCOPIC BILATERAL SALPINGECTOMY;  Surgeon: Lavina Hamman, MD;  Location: [facility];  Service: Gynecology;  Laterality: Bilateral;  ? WISDOM TOOTH EXTRACTION    ? ? ?Family History  ?Problem Relation Age of Onset  ? Hypertension Brother   ? Dementia Maternal Grandmother   ? Cancer Maternal Grandfather   ?     unknown  ? Breast cancer Mother 49  ?     second dx at 8  ? HIV/AIDS Father   ? Cancer Maternal Uncle   ?     unknown dx  ? Breast cancer Other   ?     MGF's sister  ? Colon cancer Neg Hx   ? ? ?Social History  ? ?Socioeconomic History  ? Marital status: Married  ?  Spouse name: Not on file  ? Number of children: Not on file  ? Years of education: Not on file  ? Highest education level: Not  on file  ?Occupational History  ? Not on file  ?Tobacco Use  ? Smoking status: Never  ? Smokeless tobacco: Never  ?Vaping Use  ? Vaping Use: Never used  ?Substance and Sexual Activity  ? Alcohol use: Yes  ?  Alcohol/week: 3.0 standard drinks  ?  Types: 1 Cans of beer, 2 Shots of liquor per week  ? Drug use: Not Currently  ?  Types: Marijuana  ? Sexual activity: Yes  ?  Birth control/protection: None  ?Other Topics Concern  ? Not on file  ?Social History Narrative  ? Not on file  ? ?Social Determinants of Health  ? ?Financial Resource Strain: Not on file  ?Food Insecurity: Not on file  ?Transportation Needs: Not on file  ?Physical Activity: Not on file  ?Stress: Not on file  ?Social Connections: Not on file  ?Intimate Partner Violence: Not on file  ? ? ?Outpatient Medications Prior to Visit  ?Medication Sig Dispense Refill  ? ELDERBERRY PO Take by mouth.    ? fluticasone (FLONASE) 50 MCG/ACT nasal spray Place 2 sprays into both nostrils daily. 16 g 0  ? levocetirizine (XYZAL) 5 MG tablet Take 1 tablet (5 mg total) by mouth every evening. 15 tablet  0  ? ?No facility-administered medications prior to visit.  ? ? ?No Known Allergies ? ?Review of Systems ?See pertinent positives and negatives per HPI. ?   ?Objective:  ?  ?Physical Exam ?Vitals and nursing note reviewed.  ?Constitutional:   ?   General: She is not in acute distress. ?   Appearance: Normal appearance.  ?HENT:  ?   Head: Normocephalic.  ?Eyes:  ?   Conjunctiva/sclera: Conjunctivae normal.  ?Cardiovascular:  ?   Rate and Rhythm: Normal rate and regular rhythm.  ?   Pulses: Normal pulses.  ?   Heart sounds: Normal heart sounds.  ?Pulmonary:  ?   Effort: Pulmonary effort is normal.  ?   Breath sounds: Normal breath sounds.  ?Musculoskeletal:     ?   General: No swelling or tenderness.  ?   Cervical back: Normal range of motion.  ?Skin: ?   General: Skin is warm.  ?Neurological:  ?   General: No focal deficit present.  ?   Mental Status: She is alert and  oriented to person, place, and time.  ?Psychiatric:     ?   Mood and Affect: Mood normal.     ?   Behavior: Behavior normal.     ?   Thought Content: Thought content normal.     ?   Judgment: Judgment normal.  ? ? ?BP 124/80 (BP Location: Left Arm, Patient Position: Sitting, Cuff Size: Normal)   Pulse 100   Temp 98.5 ?F (36.9 ?C) (Temporal)   Ht 5\' 8"  (1.727 m)   Wt 173 lb 3.2 oz (78.6 kg)   LMP 09/24/2021 (Approximate)   SpO2 97%   BMI 26.33 kg/m?  ?Wt Readings from Last 3 Encounters:  ?10/14/21 173 lb 3.2 oz (78.6 kg)  ?07/07/21 175 lb 4 oz (79.5 kg)  ?03/02/21 182 lb 3.2 oz (82.6 kg)  ? ? ?Health Maintenance Due  ?Topic Date Due  ? COVID-19 Vaccine (1) Never done  ? TETANUS/TDAP  07/05/2020  ? ? ?There are no preventive care reminders to display for this patient. ? ? ?Lab Results  ?Component Value Date  ? TSH 0.39 03/02/2021  ? ?Lab Results  ?Component Value Date  ? WBC 6.1 03/02/2021  ? HGB 14.0 03/02/2021  ? HCT 42.2 03/02/2021  ? MCV 99.7 03/02/2021  ? PLT 298.0 03/02/2021  ? ?Lab Results  ?Component Value Date  ? NA 139 03/02/2021  ? K 4.2 03/02/2021  ? CO2 27 03/02/2021  ? GLUCOSE 84 03/02/2021  ? BUN 5 (L) 03/02/2021  ? CREATININE 0.92 03/02/2021  ? BILITOT 0.4 03/02/2021  ? ALKPHOS 55 03/02/2021  ? AST 18 03/02/2021  ? ALT 17 03/02/2021  ? PROT 7.4 03/02/2021  ? ALBUMIN 4.3 03/02/2021  ? CALCIUM 9.6 03/02/2021  ? ANIONGAP 10 06/12/2015  ? GFR 78.85 03/02/2021  ? ?Lab Results  ?Component Value Date  ? CHOL 168 03/02/2021  ? ?Lab Results  ?Component Value Date  ? HDL 50.70 03/02/2021  ? ?Lab Results  ?Component Value Date  ? LDLCALC 104 (H) 03/02/2021  ? ?Lab Results  ?Component Value Date  ? TRIG 64.0 03/02/2021  ? ?Lab Results  ?Component Value Date  ? CHOLHDL 3 03/02/2021  ? ?No results found for: HGBA1C ? ?   ?Assessment & Plan:  ? ?Problem List Items Addressed This Visit   ? ?  ? Other  ? Breast cancer screening, high risk patient  ?  She has a high risk of  breast cancer, with her mom having  breast cancer twice.  She has had routine mammograms, however has not had one in several years.  Mammogram ordered today. ?  ?  ? Relevant Orders  ? MM 3D SCREEN BREAST BILATERAL  ? Chest wall pain - Primary  ?  Intermittent, sharp pains behind left breast for the past 3 days.  She does endorse recently having a cold with a cough and congestion.  No red flags on exam.  Chest wall is not tender to touch.  Recent labs reviewed from 02/2021.  She is also having some increased anxiety and stress recently.  We will check a chest x-ray today with ongoing coughing.  She can take ibuprofen or Tylenol as needed for pain.  Also discussed using heating pad as needed.  Check a mammogram as mentioned above.  Follow-up if symptoms worsen or do not improve. ?  ?  ? Relevant Orders  ? DG Chest 2 View  ? ? ?No orders of the defined types were placed in this encounter. ? ? ?Gerre ScullLauren A Damyia Strider, NP ? ?

## 2021-10-14 NOTE — Assessment & Plan Note (Signed)
Intermittent, sharp pains behind left breast for the past 3 days.  She does endorse recently having a cold with a cough and congestion.  No red flags on exam.  Chest wall is not tender to touch.  Recent labs reviewed from 02/2021.  She is also having some increased anxiety and stress recently.  We will check a chest x-ray today with ongoing coughing.  She can take ibuprofen or Tylenol as needed for pain.  Also discussed using heating pad as needed.  Check a mammogram as mentioned above.  Follow-up if symptoms worsen or do not improve. ?

## 2021-10-14 NOTE — Progress Notes (Signed)
Patient complaining of left under breast rib pain that has been ongoing  for last 3 days and is sharp and just happens randomly ?

## 2021-11-04 ENCOUNTER — Ambulatory Visit
Admission: RE | Admit: 2021-11-04 | Discharge: 2021-11-04 | Disposition: A | Discharge status: Home / Self Care | Payer: No Typology Code available for payment source | Source: Ambulatory Visit | Attending: Nurse Practitioner | Admitting: Nurse Practitioner

## 2021-11-04 DIAGNOSIS — Z1239 Encounter for other screening for malignant neoplasm of breast: Secondary | ICD-10-CM

## 2021-11-04 IMAGING — MG MM DIGITAL SCREENING BILAT W/ TOMO AND CAD
8 series · 9 of 24 positions shown · non-contrast
Comparison: Previous exam(s).

CLINICAL DATA: Screening.

EXAM:
DIGITAL SCREENING BILATERAL MAMMOGRAM WITH TOMOSYNTHESIS AND CAD
TECHNIQUE: Bilateral screening digital craniocaudal and mediolateral oblique
mammograms were obtained. Bilateral screening digital breast
tomosynthesis was performed. The images were evaluated with
computer-aided detection.

[L CC synth-2D]
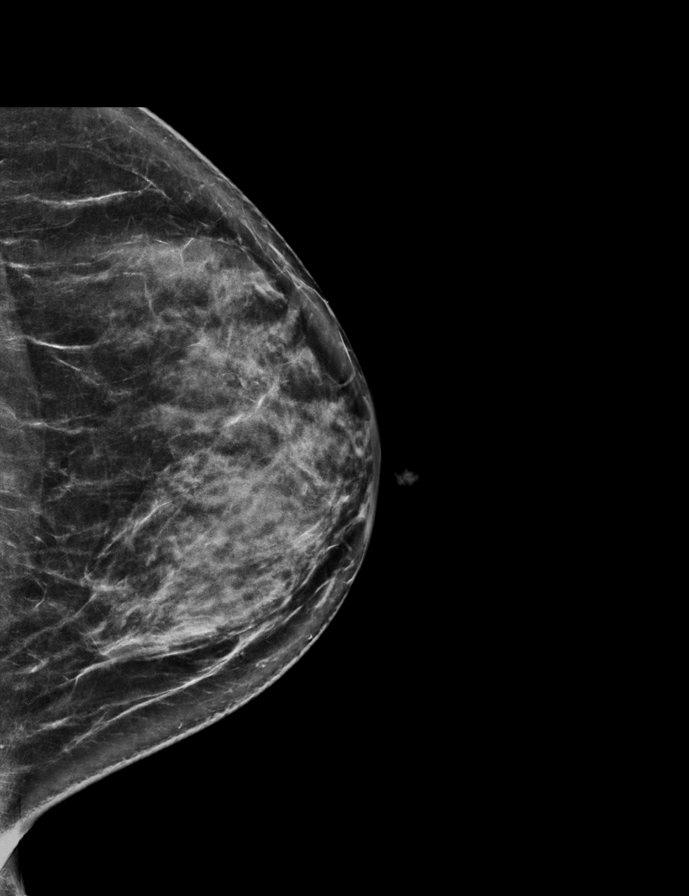

[L MLO synth-2D]
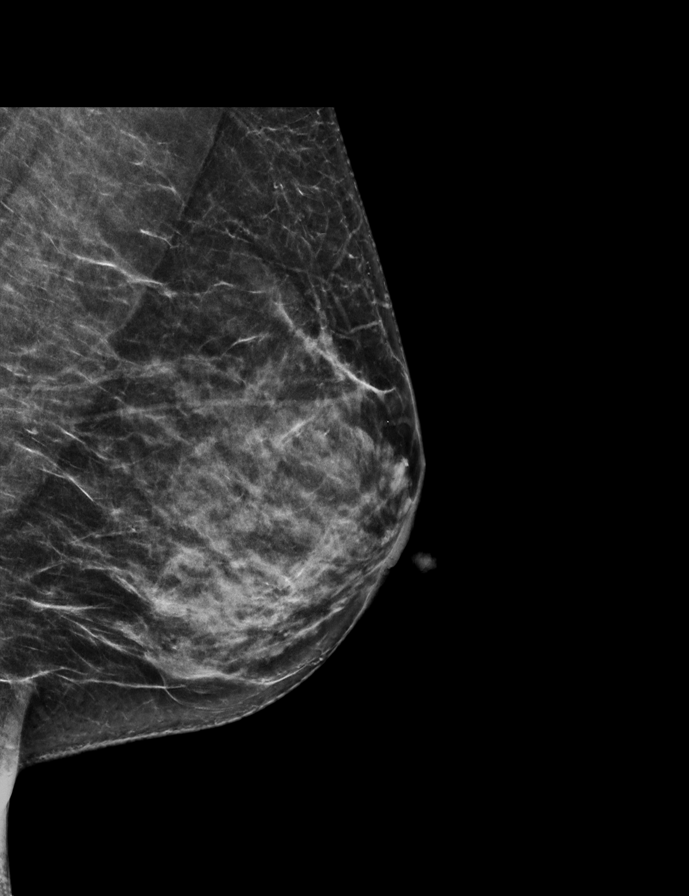

[R MLO synth-2D]
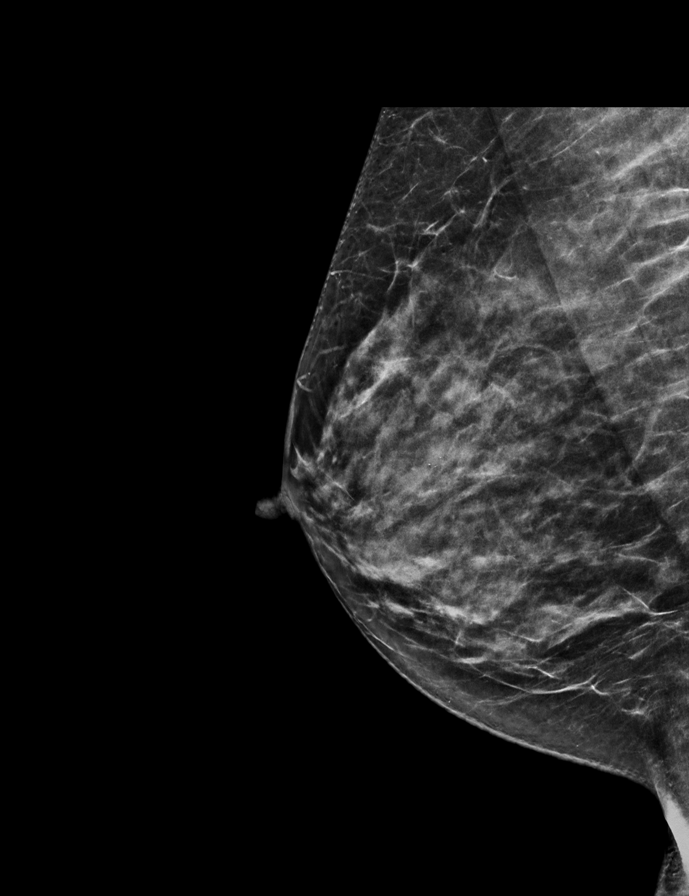

[R CC synth-2D]
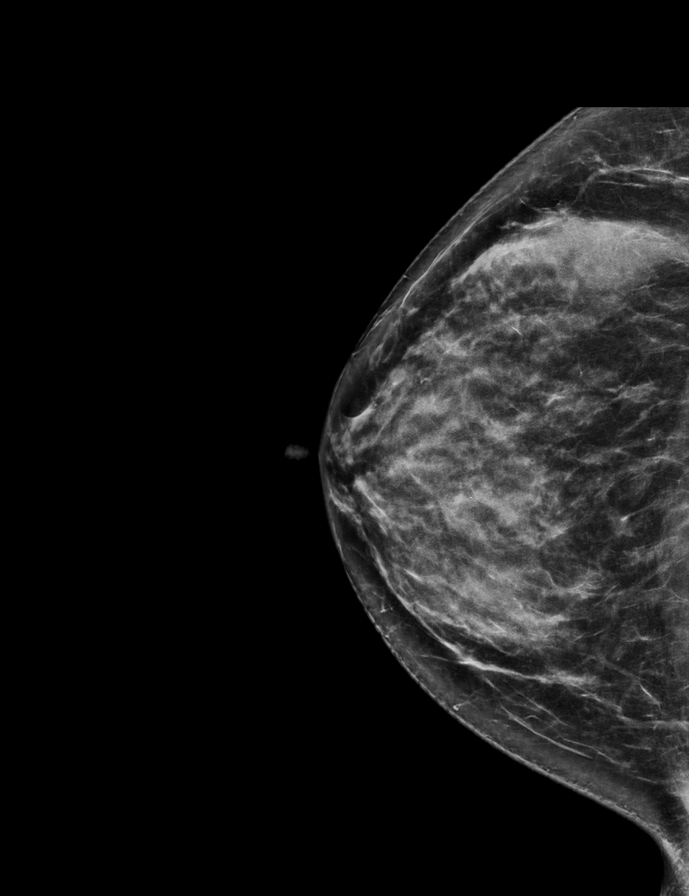

[R CC tomo · 2 of 68 frames shown]
[frame 22/68]
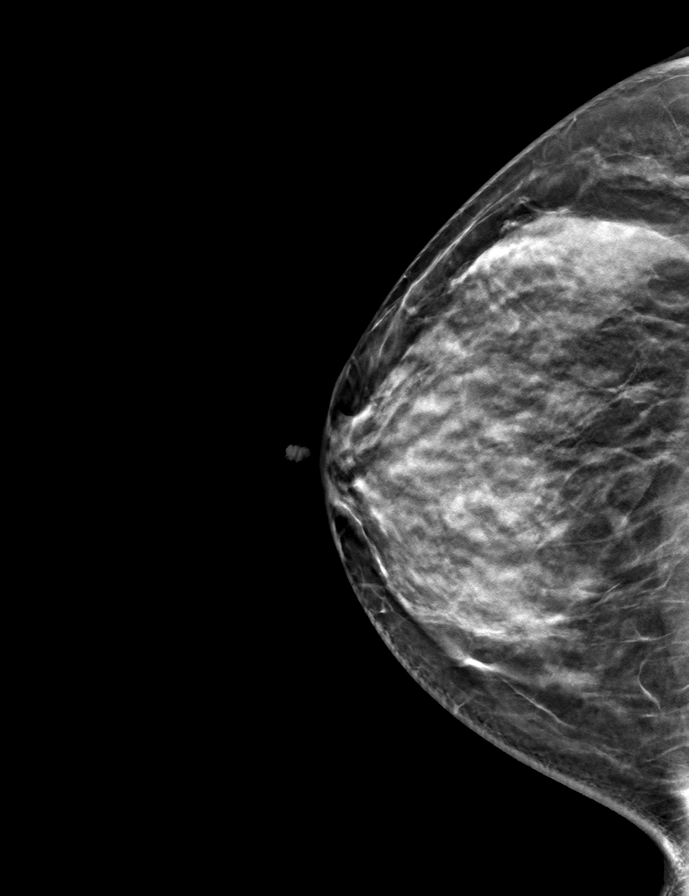
[frame 35/68]
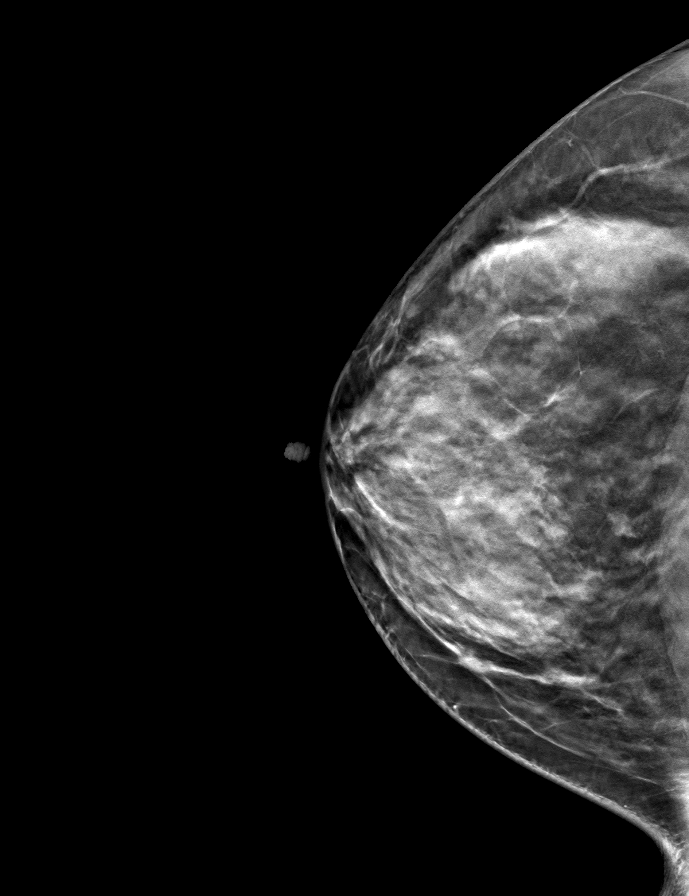

[L MLO tomo · tomo slice 33/65.0]
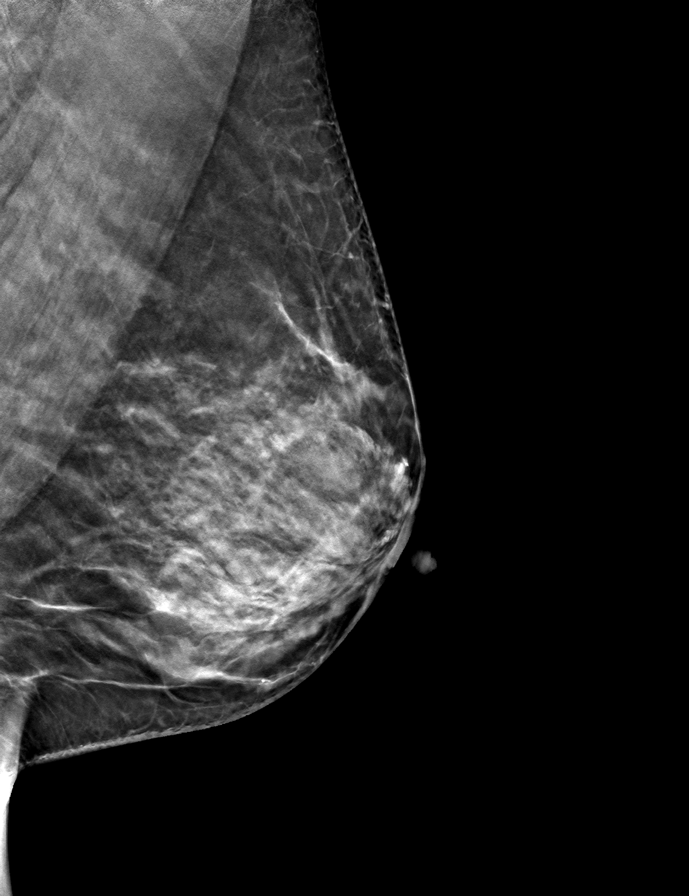

[L CC tomo · tomo slice 35/68.0]
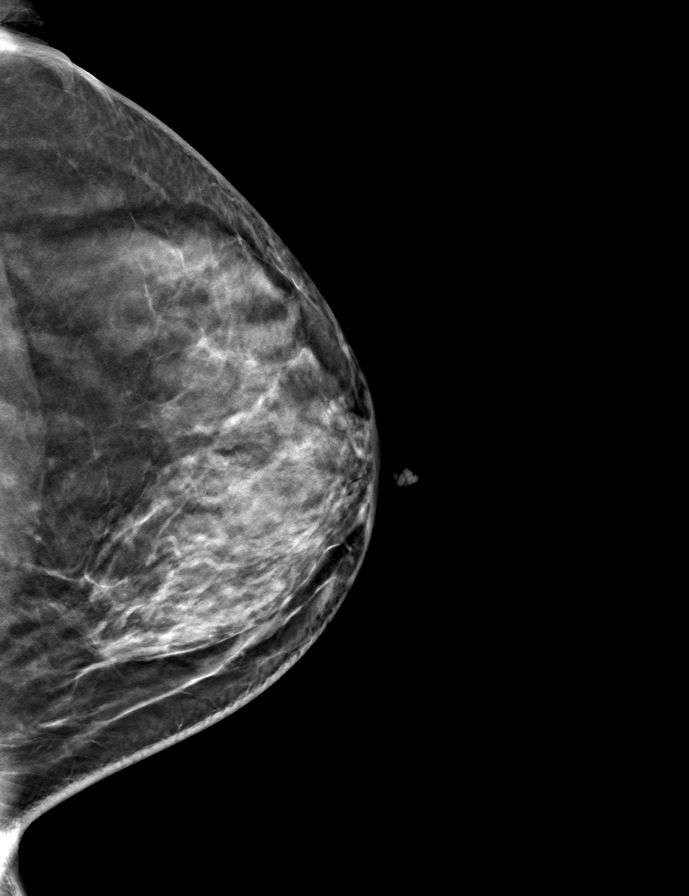

[R MLO tomo · tomo slice 33/64.0]
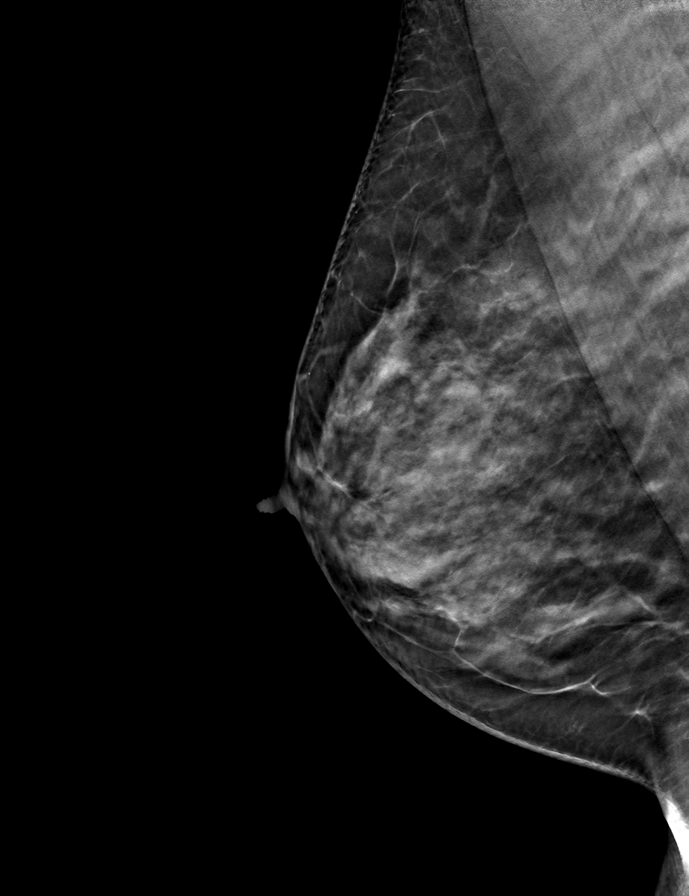

[9 of 24 positions shown; findings below may reference images not displayed]

ACR Breast Density Category c: The breast tissue is heterogeneously
dense, which may obscure small masses.
FINDINGS: In the right breast a possible asymmetry requires further
evaluation.

In the left breast a possible asymmetry in the inner breast with
possible correlate in the lower breast requires further evaluation.
IMPRESSION: Further evaluation is suggested for possible asymmetry in the right
breast.

Further evaluation is suggested for possible focal asymmetry in the
left breast.

RECOMMENDATION:
Diagnostic mammogram and possibly ultrasound of both breasts.
(Code:[7H])

The patient will be contacted regarding the findings, and additional
imaging will be scheduled.

BI-RADS CATEGORY  0: Incomplete. Need additional imaging evaluation
and/or prior mammograms for comparison.

## 2021-11-08 ENCOUNTER — Other Ambulatory Visit: Payer: Self-pay | Admitting: Nurse Practitioner

## 2021-11-08 DIAGNOSIS — R928 Other abnormal and inconclusive findings on diagnostic imaging of breast: Secondary | ICD-10-CM

## 2021-11-12 ENCOUNTER — Ambulatory Visit (INDEPENDENT_AMBULATORY_CARE_PROVIDER_SITE_OTHER): Payer: No Typology Code available for payment source

## 2021-11-12 ENCOUNTER — Ambulatory Visit (INDEPENDENT_AMBULATORY_CARE_PROVIDER_SITE_OTHER): Payer: No Typology Code available for payment source | Admitting: Nurse Practitioner

## 2021-11-12 ENCOUNTER — Ambulatory Visit: Payer: No Typology Code available for payment source

## 2021-11-12 ENCOUNTER — Encounter: Payer: Self-pay | Admitting: Nurse Practitioner

## 2021-11-12 DIAGNOSIS — M25552 Pain in left hip: Secondary | ICD-10-CM | POA: Diagnosis not present

## 2021-11-12 DIAGNOSIS — M25562 Pain in left knee: Secondary | ICD-10-CM
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
## 2021-11-19 ENCOUNTER — Ambulatory Visit
Admission: RE | Admit: 2021-11-19 | Discharge: 2021-11-19 | Disposition: A | Discharge status: Home / Self Care | Payer: No Typology Code available for payment source | Source: Ambulatory Visit | Attending: Nurse Practitioner | Admitting: Nurse Practitioner

## 2021-11-19 ENCOUNTER — Ambulatory Visit: Payer: No Typology Code available for payment source

## 2021-11-19 DIAGNOSIS — R928 Other abnormal and inconclusive findings on diagnostic imaging of breast: Secondary | ICD-10-CM

## 2021-11-19 IMAGING — MG DIGITAL DIAGNOSTIC BILAT W/ TOMO W/ CAD
8 series · 8 of 24 positions shown · non-contrast
Comparison: Previous exam(s).

CLINICAL DATA: Screening recall for bilateral breast asymmetries.
Strong family history of breast cancer, with patient's mother
initially diagnosed with breast cancer at age 37 and second
diagnosis of triple negative breast cancer at age 63.

EXAM:
DIGITAL DIAGNOSTIC BILATERAL MAMMOGRAM WITH TOMOSYNTHESIS AND CAD
TECHNIQUE: Bilateral digital diagnostic mammography and breast tomosynthesis
was performed. The images were evaluated with computer-aided
detection.

[L CC synth-2D]
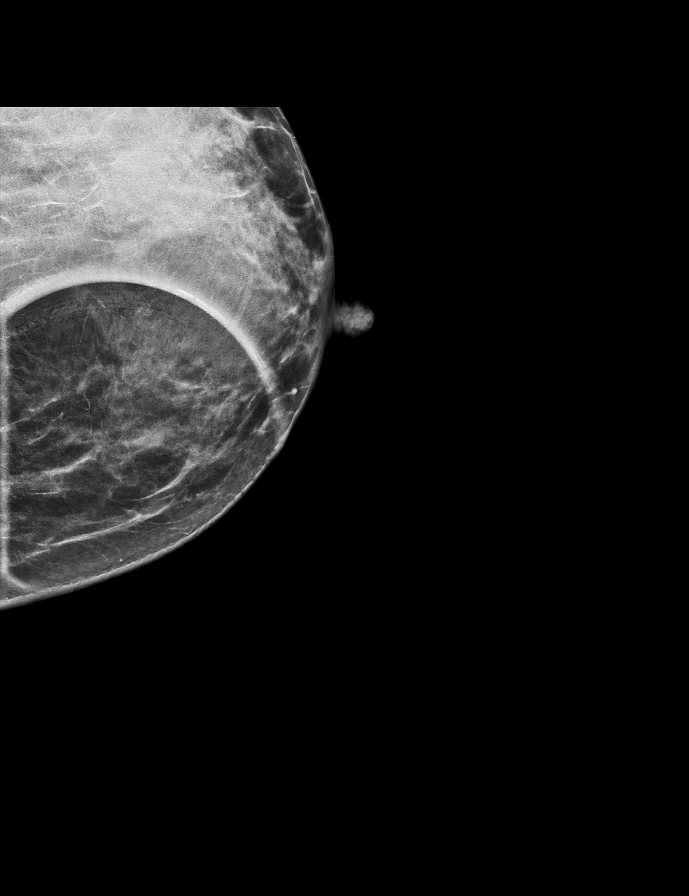

[L MLO synth-2D]
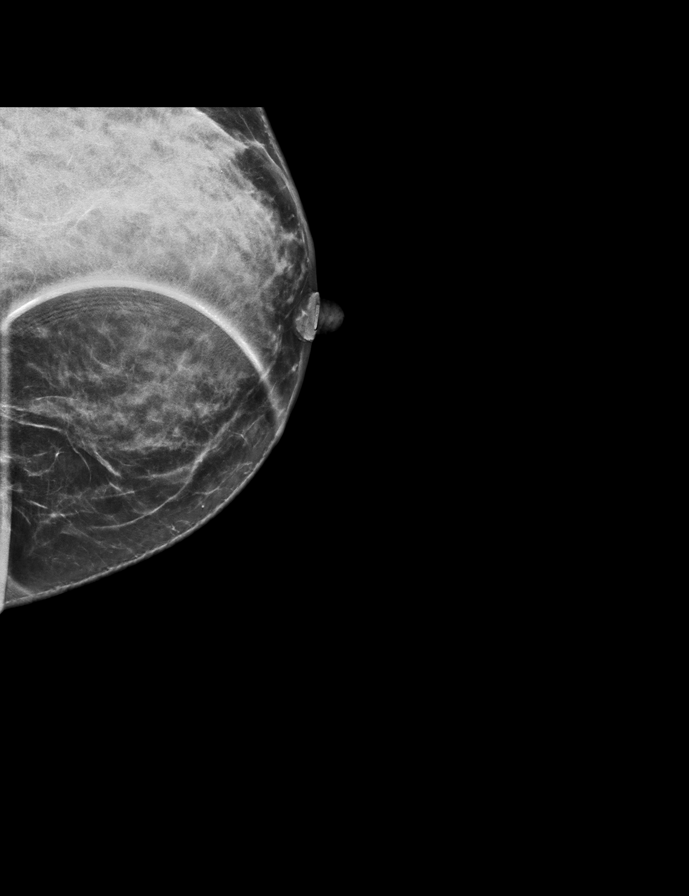

[R CC synth-2D]
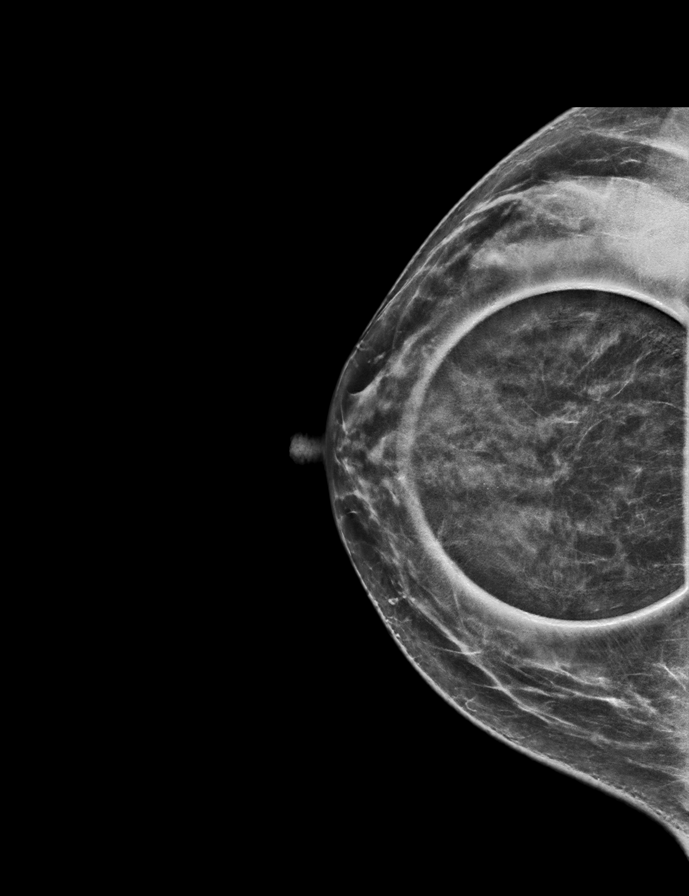

[R ML synth-2D]
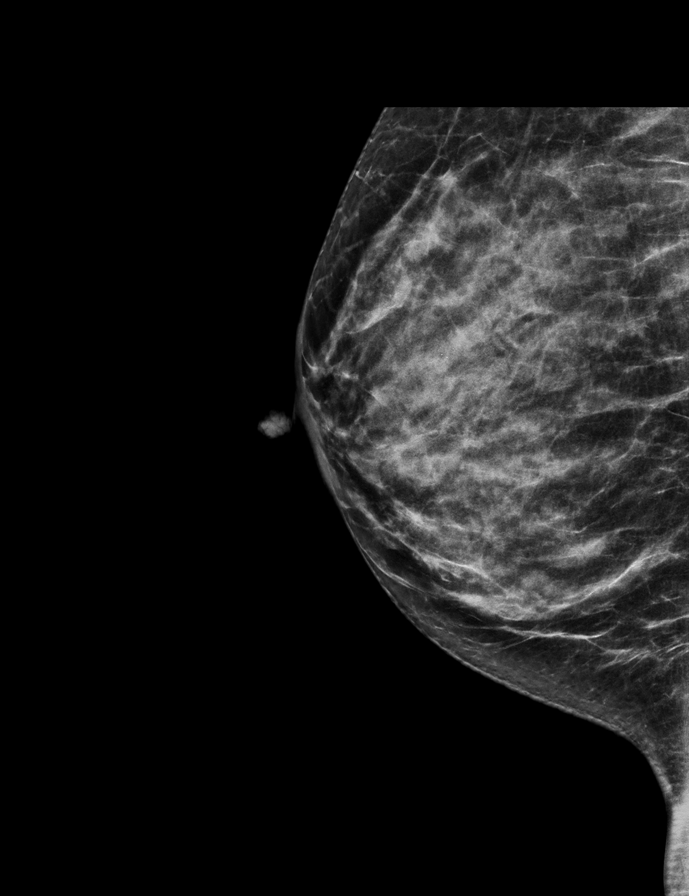

[R CC tomo · tomo slice 32/63.0]
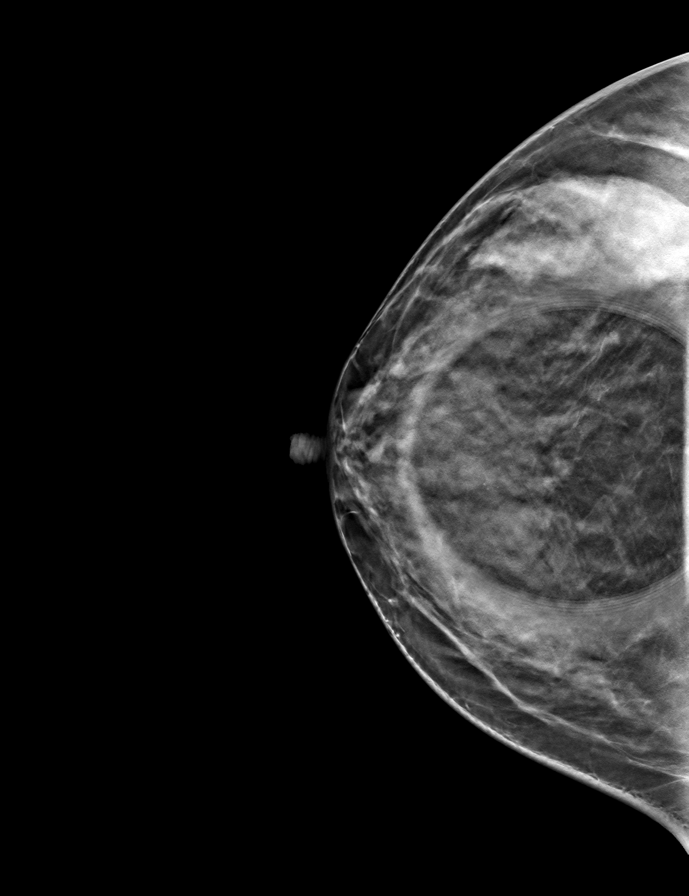

[L MLO tomo · tomo slice 27/52.0]
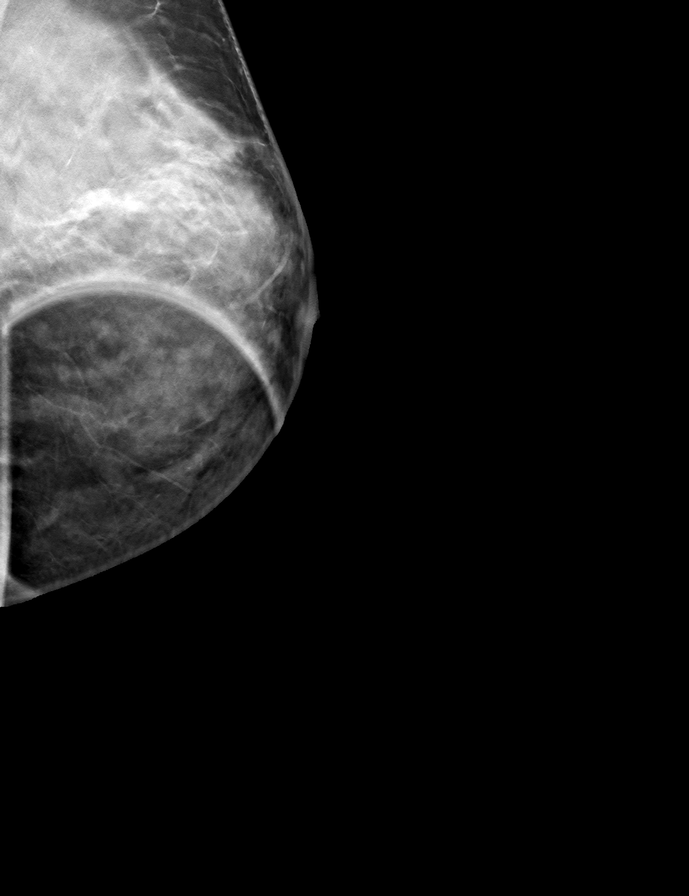

[L CC tomo · tomo slice 27/52.0]
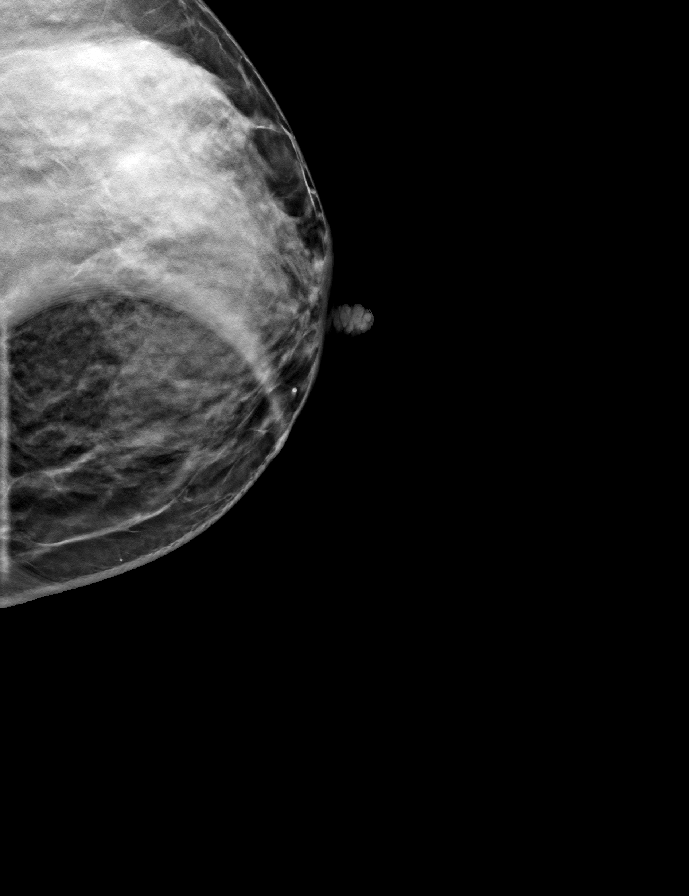

[R ML tomo · tomo slice 30/59.0]
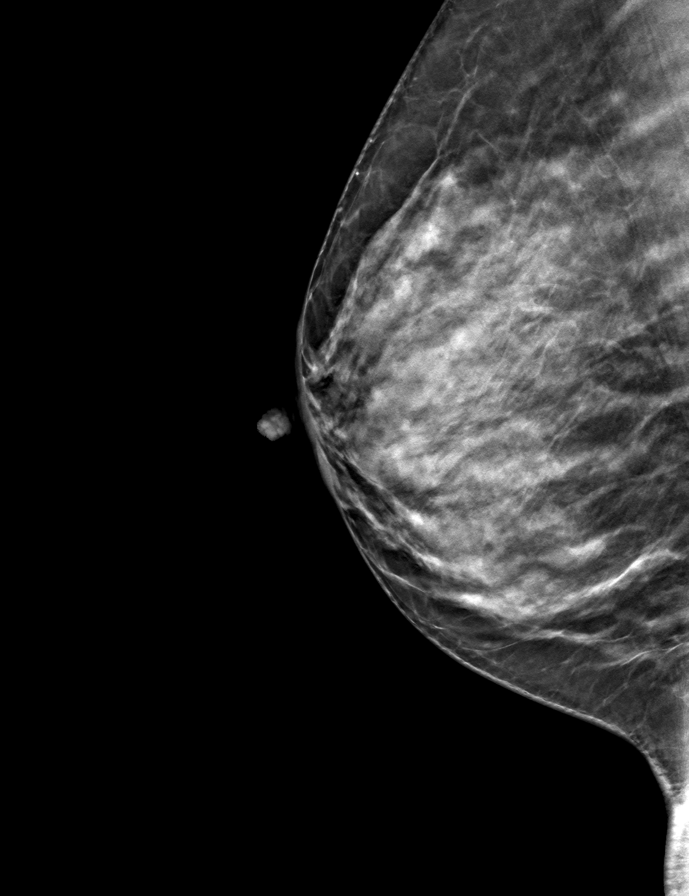

[8 of 24 positions shown; findings below may reference images not displayed]

ACR Breast Density Category c: The breast tissue is heterogeneously
dense, which may obscure small masses.
FINDINGS: Additional tomograms were performed of the bilateral breast. The
initially questioned bilateral breast asymmetries resolve on the
additional imaging with findings compatible with areas of
overlapping fibroglandular tissue. There is no mammographic evidence
of malignancy in either breast.
IMPRESSION: No mammographic evidence of malignancy in either breast.

RECOMMENDATION:
1.  Screening mammogram in one year.(Code:[0O])

2. Patient has a strong family history of breast cancer with mother
initially diagnosed with breast cancer at age 37 and second
diagnosis of triple negative breast cancer at age 64. Given dense
fibroglandular tissue and strong family history high risk screening
breast MRI should be considered. The [employer]
recommends annual MRI and mammography in patients with an estimated
lifetime risk of developing breast cancer greater than 20 - 25%, or
who are known or suspected to be positive for the breast cancer
gene.

I have discussed the findings and recommendations with the patient.
If applicable, a reminder letter will be sent to the patient
regarding the next appointment.

BI-RADS CATEGORY  1: Negative.

## 2021-11-21 ENCOUNTER — Encounter: Payer: Self-pay | Admitting: Nurse Practitioner

## 2021-11-21 DIAGNOSIS — N6320 Unspecified lump in the left breast, unspecified quadrant: Secondary | ICD-10-CM

## 2021-11-21 DIAGNOSIS — Z1239 Encounter for other screening for malignant neoplasm of breast: Secondary | ICD-10-CM

## 2021-12-06 ENCOUNTER — Ambulatory Visit
Admission: RE | Admit: 2021-12-06 | Discharge: 2021-12-06 | Disposition: A | Discharge status: Home / Self Care | Payer: No Typology Code available for payment source | Source: Ambulatory Visit | Attending: Nurse Practitioner | Admitting: Nurse Practitioner

## 2021-12-06 DIAGNOSIS — Z1239 Encounter for other screening for malignant neoplasm of breast: Secondary | ICD-10-CM

## 2021-12-06 IMAGING — MR MR BREAST WO/W CM  BILAT
4 of 5 series · 29 of 48 positions shown · IV contrast (8 ML GADAVIST)
Comparison: None Available.

CLINICAL DATA: Abbreviated Breast MRI for breast cancer screening.

EXAM:
BILATERAL ABBREVIATED BREAST MRI WITH AND WITHOUT CONTRAST
TECHNIQUE: Multiplanar, multisequence MR images of both breasts were obtained
prior to and following the intravenous administration of 8 ml of
Gadavist

[Series 3: t2_tirm_tra ipat (a-p) · axial · 3.0mm · 0.70mm/px · z∈[-24,+138]mm · 5 of 55 slices shown]
[im 1/55]
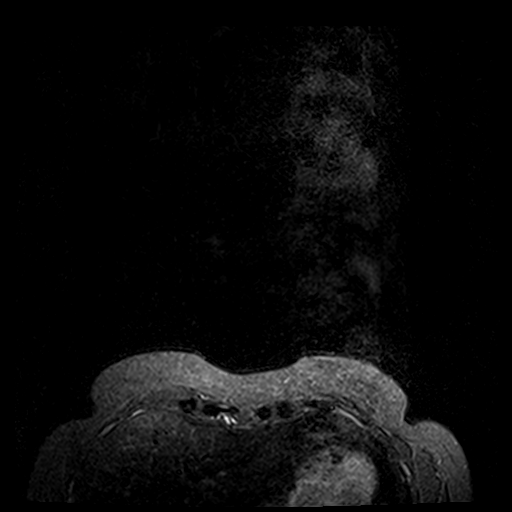
[im 14/55]
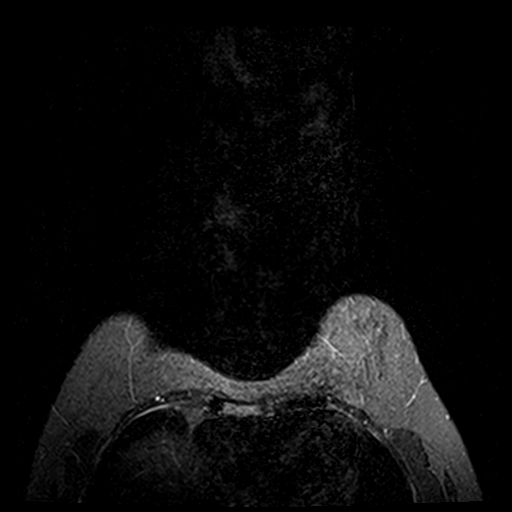
[im 28/55]
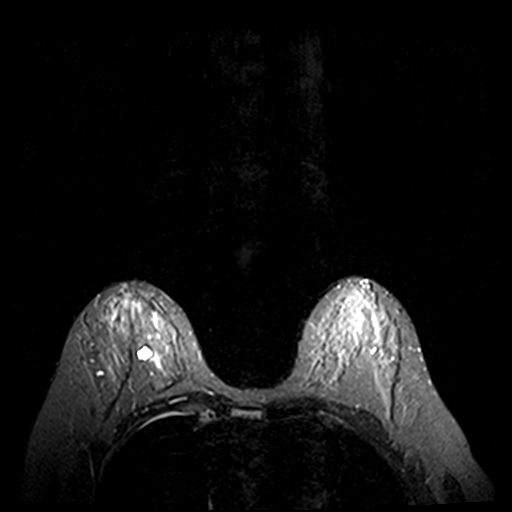
[im 41/55]
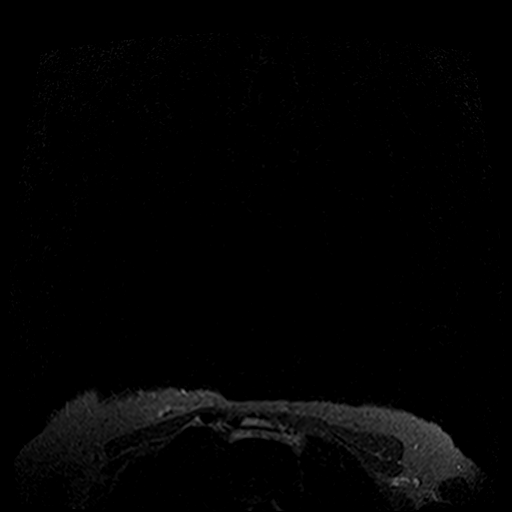
[im 55/55]
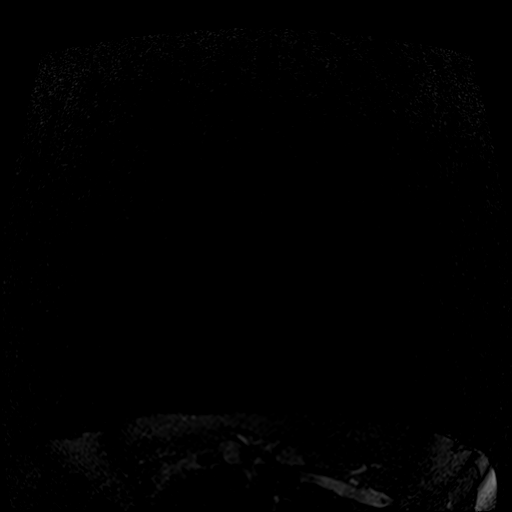

[Series 4: fl3d pre-cm · axial · non-contrast · 1.2mm · 0.94mm/px · z∈[-43,+129]mm · 8 of 144 slices shown]
[im 1/144]
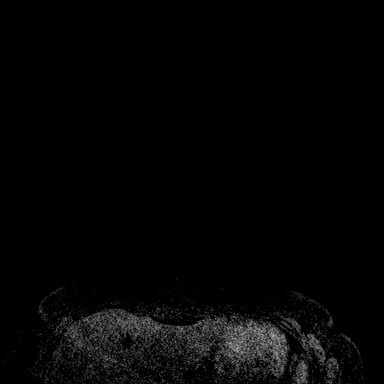
[im 23/144]
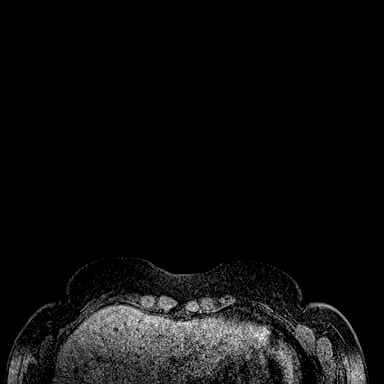
[im 45/144]
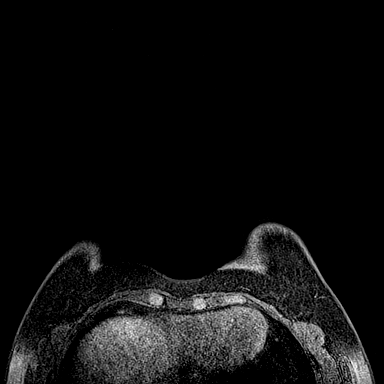
[im 67/144]
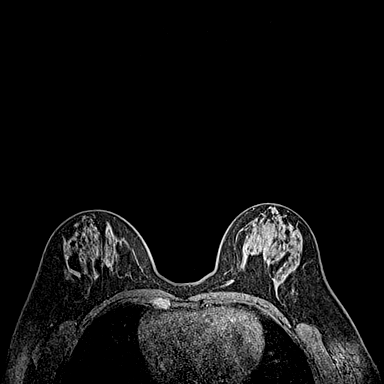
[im 78/144]
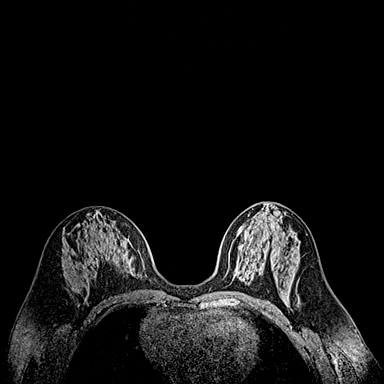
[im 100/144]
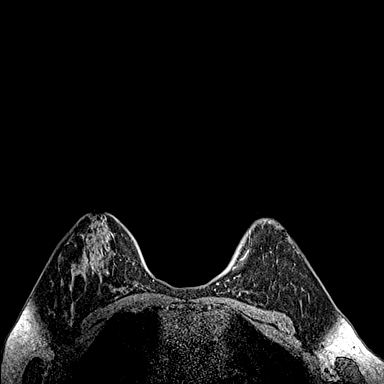
[im 122/144]
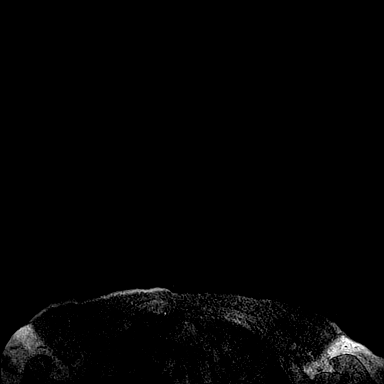
[im 144/144]
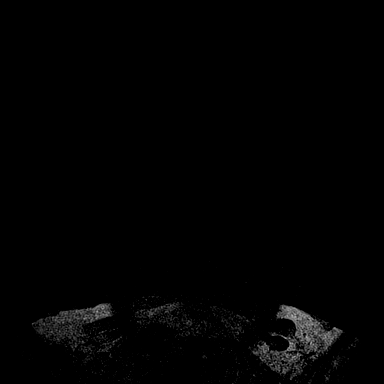

[Series 5: fl3d post-cm 20 · axial · 1.2mm · 0.94mm/px · z∈[-43,+129]mm · 8 of 144 slices shown (1 of 2)]
[im 1/144]
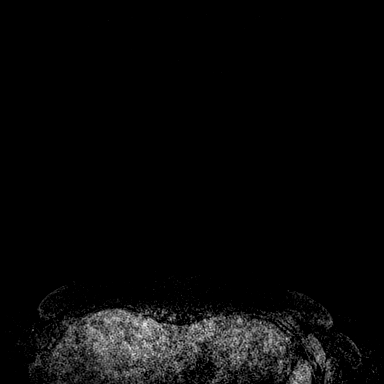
[im 23/144]
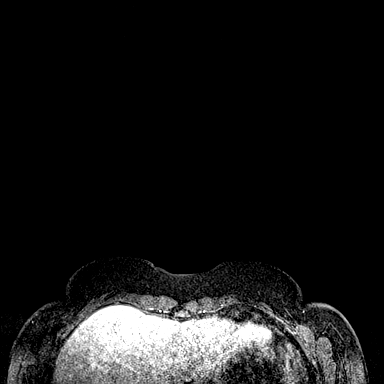
[im 45/144]
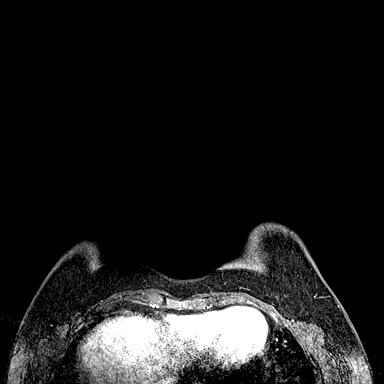
[im 67/144]
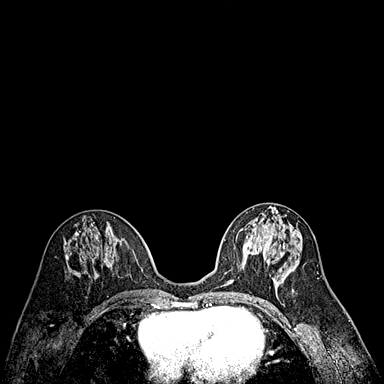
[im 78/144]
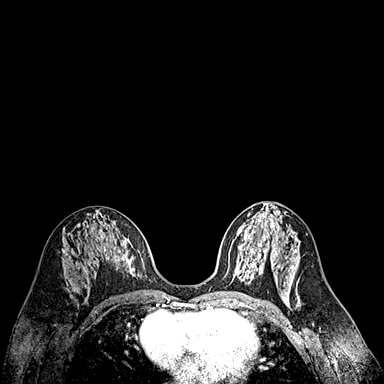
[im 100/144]
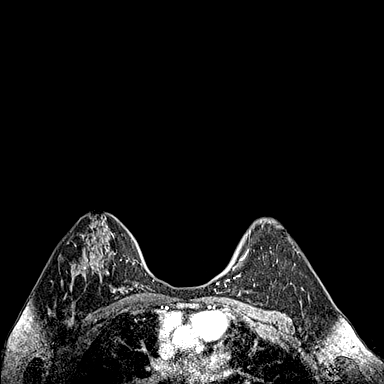
[im 122/144]
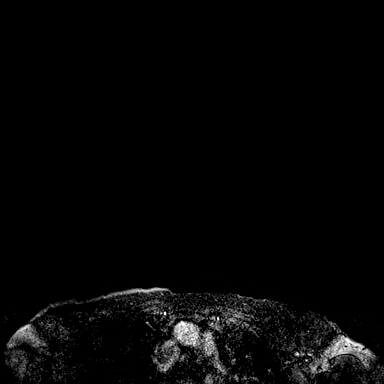
[im 144/144]
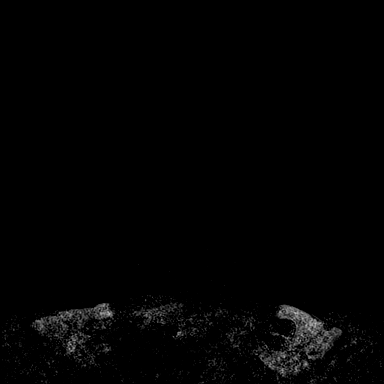

[Series 6: fl3d post-cm 20 · axial · 1.2mm · 0.94mm/px · z∈[-43,+129]mm · 8 of 144 slices shown (2 of 2)]
[im 1/144]
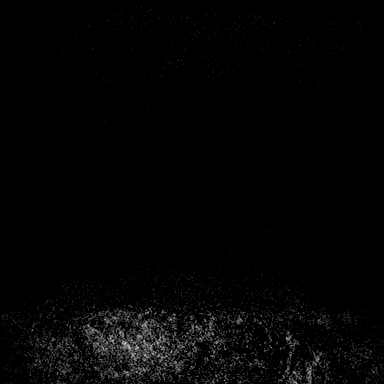
[im 23/144]
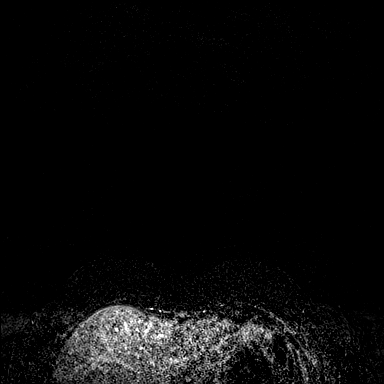
[im 45/144]
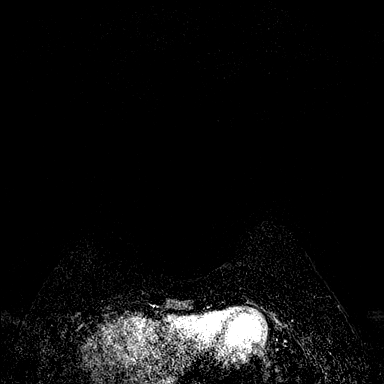
[im 67/144]
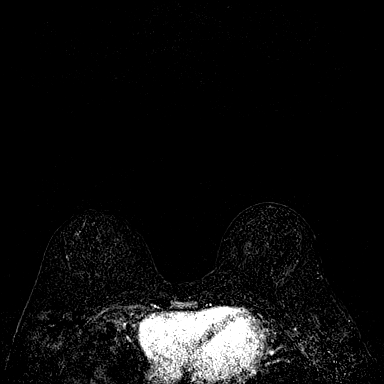
[im 78/144]
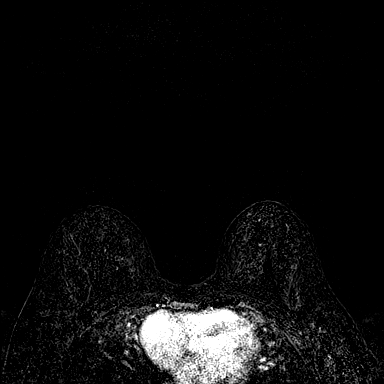
[im 100/144]
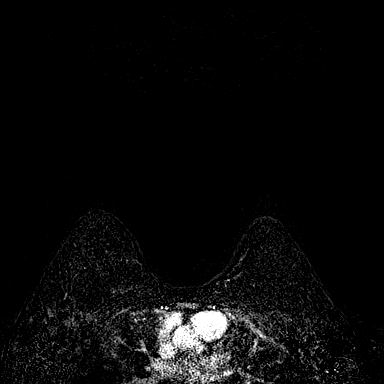
[im 122/144]
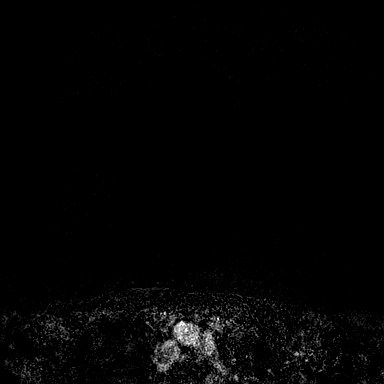
[im 144/144]
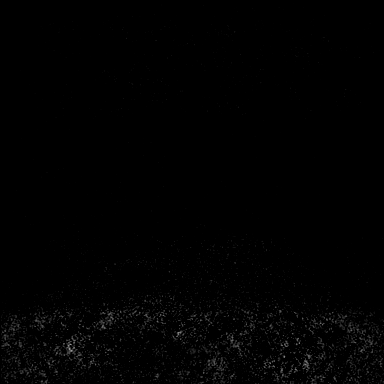

[29 of 48 positions shown; findings below may reference images not displayed]

Three-dimensional MR images were rendered by post-processing of the
original MR data on an independent workstation. The
three-dimensional MR images were interpreted, and findings are
reported in the following complete MRI report for this study. Three
dimensional images were evaluated at the independent DynaCad
workstation
FINDINGS: Breast composition: c. Heterogeneous fibroglandular tissue.

Background parenchymal enhancement: Moderate.

Right breast: No mass or abnormal enhancement.

Left breast: There is a small mass with possible fatty hilum in the
slightly medial posterior left breast measuring 0.7 cm (series 6,
image 57). There is an additional similar small mass in the lower
outer left breast measuring 0.4 cm (series 6, image 90). No
additional enhancing masses or abnormal non mass enhancement

Lymph nodes: No abnormal appearing lymph nodes.

Ancillary findings:  None.
IMPRESSION: 1. There are 2 small masses in the left breast, one in the slightly
medial posterior left breast and the other in the lower outer left
breast measuring 0.7 cm and 0.4 cm. These are indeterminate though
most likely represent intramammary lymph nodes.
2. No MRI evidence of malignancy in the right breast.

RECOMMENDATION:
Given that patient is high risk with indeterminate findings in the
left breast, recommend full protocol diagnostic MRI for further
characterization of the small masses in the left breast.

BI-RADS CATEGORY  0: Incomplete. Need additional imaging evaluation
and/or prior mammograms for comparison.

## 2021-12-06 MED ORDER — GADOBUTROL 1 MMOL/ML IV SOLN
8.0000 mL | Freq: Once | INTRAVENOUS | Status: AC | PRN
  Administered 2021-12-06: 8 mL via INTRAVENOUS

## 2021-12-06 NOTE — Telephone Encounter (Signed)
-----   Message from Gerre Scull, NP sent at 12/06/2021  4:59 PM EDT ----- The MRI showed 2 possible masses in her left breast. Do I need to order the further imaging that they discussed?    ----- Message ----- From: Interface, Rad Results In Sent: 12/06/2021   3:10 PM EDT To: Gerre Scull, NP

## 2021-12-07 ENCOUNTER — Telehealth: Payer: Self-pay | Admitting: Nurse Practitioner

## 2021-12-07 NOTE — Telephone Encounter (Signed)
Called and spoke with Sarah Parks about the results of her screening MRI.  Answered her questions, will place order for MRI bilateral with and without contrast per [facility] imaging

## 2021-12-08 ENCOUNTER — Telehealth: Payer: Self-pay

## 2021-12-08 NOTE — Telephone Encounter (Signed)
Nche pt but she wanted to leave a message for [facility] to give her a call back about the referrals being sent out for her. She thinks its excessive and wants to understand.  Thank you

## 2021-12-10 NOTE — Telephone Encounter (Signed)
Called and ldvm. Sw,cma Advised to call back directly if there are further questions. 

## 2021-12-13 ENCOUNTER — Telehealth (HOSPITAL_COMMUNITY): Payer: Self-pay | Admitting: Dietician

## 2021-12-13 NOTE — Telephone Encounter (Signed)
Return call placed to patient regarding request for nutrition appointment. Explained to patient [facility] nutrition services are reserved for patients with current cancer diagnosis undergoing treatment. Suggested speaking with PCP about referral to [facility] if additional nutrition are warranted at this time. Will mail handouts on Benefits of Plant-based diet. Patient appreciative of call and suggestions.

## 2021-12-15 ENCOUNTER — Other Ambulatory Visit: Payer: Self-pay | Admitting: Nurse Practitioner

## 2021-12-15 ENCOUNTER — Ambulatory Visit
Admission: RE | Admit: 2021-12-15 | Discharge: 2021-12-15 | Disposition: A | Discharge status: Home / Self Care | Payer: No Typology Code available for payment source | Source: Ambulatory Visit | Attending: Nurse Practitioner | Admitting: Nurse Practitioner

## 2021-12-15 DIAGNOSIS — N6320 Unspecified lump in the left breast, unspecified quadrant: Secondary | ICD-10-CM

## 2021-12-15 DIAGNOSIS — Z1239 Encounter for other screening for malignant neoplasm of breast: Secondary | ICD-10-CM

## 2021-12-15 IMAGING — MR MR BREAST BILAT WO/W CM
8 of 12 series · 32 of 48 positions shown · IV contrast (8 ml gadavist)
Comparison: Bilateral abbreviated breast MRI dated [DATE],
bilateral diagnostic mammogram dated [DATE] and bilateral
screening mammogram dated [DATE].

CLINICAL DATA: Two small left breast masses on a recent abbreviated
breast MRI performed for high risk screening. The patient's mother
was diagnosed with breast cancer at age 37 and again with triple
negative breast cancer diagnosed at age 63. She also had a maternal
great aunt with a history of breast cancer. She has heterogeneously
dense breast tissue. A full protocol diagnostic MRI was recommended
to further characterize the 2 small left breast masses.

EXAM:
BILATERAL BREAST MRI WITH AND WITHOUT CONTRAST
TECHNIQUE: Multiplanar, multisequence MR images of both breasts were obtained
prior to and following the intravenous administration of 8 ml of
Gadavist

[Series 2: t2_tirm_tra ipat (a-p) · axial · 3.0mm · 0.70mm/px · 1 of 55 slices shown]
[im 1/55]
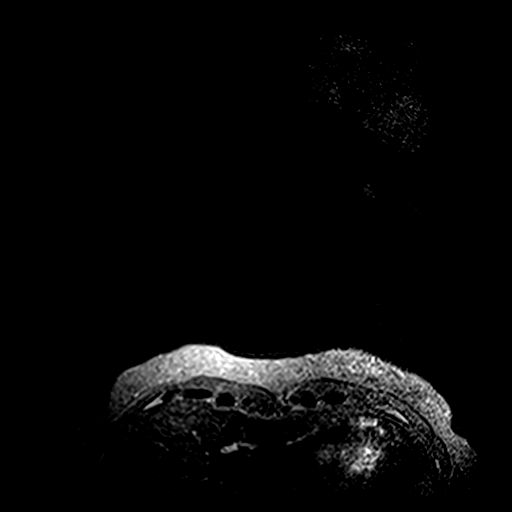

[Series 3: fl3d pre-cm no · axial · non-contrast · 1.2mm · 0.89mm/px · z∈[-94,+78]mm · 5 of 144 slices shown]
[im 1/144]
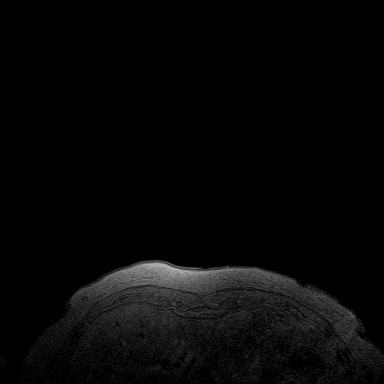
[im 36/144]
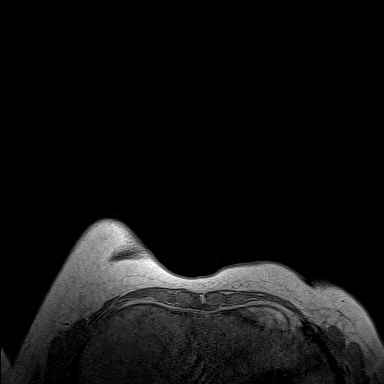
[im 72/144]
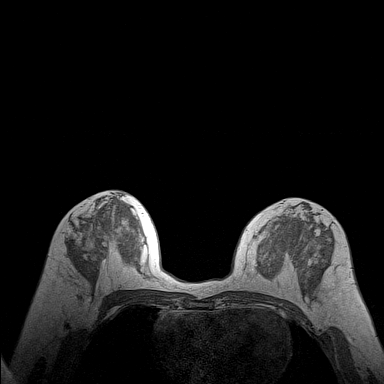
[im 108/144]
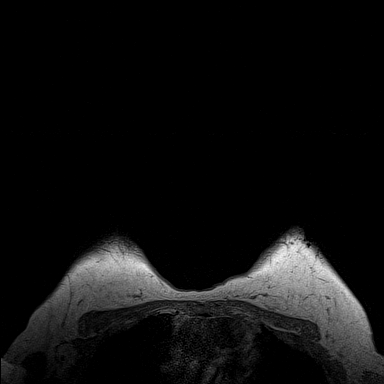
[im 144/144]
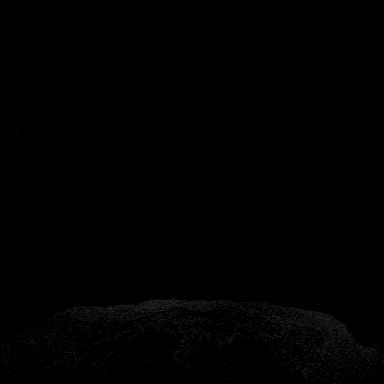

[Series 4: fl3d pre-cm · axial · non-contrast · 1.2mm · 0.89mm/px · z∈[-94,+78]mm · 5 of 144 slices shown]
[im 1/144]
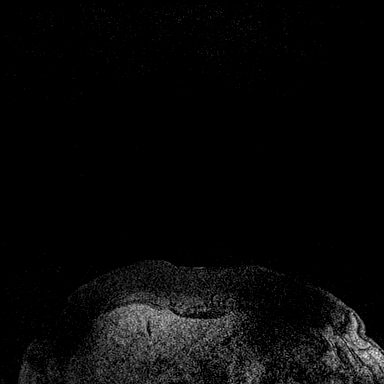
[im 36/144]
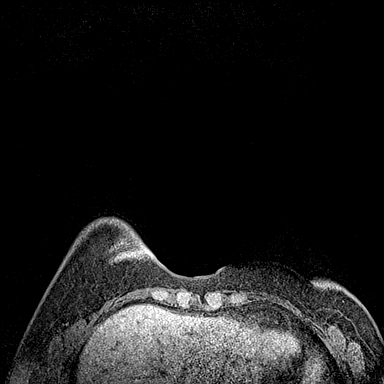
[im 72/144]
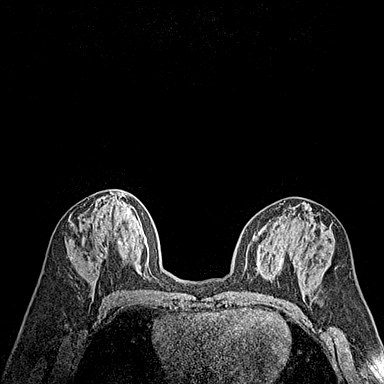
[im 108/144]
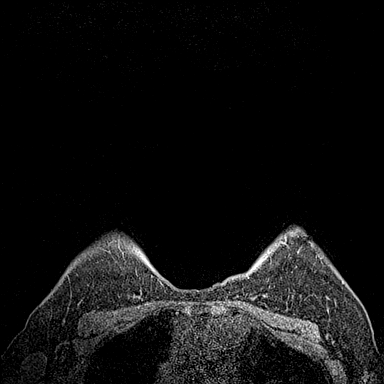
[im 144/144]
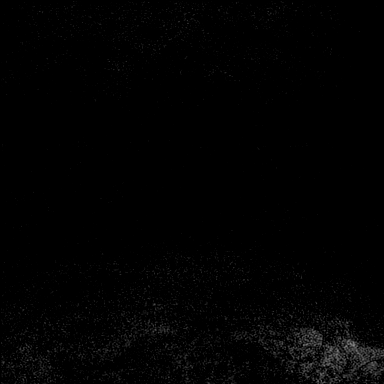

[Series 5: fl3d post-cm 20 · axial · 1.2mm · 0.89mm/px · z∈[-94,+78]mm · 5 of 144 slices shown (1 of 3)]
[im 1/144]
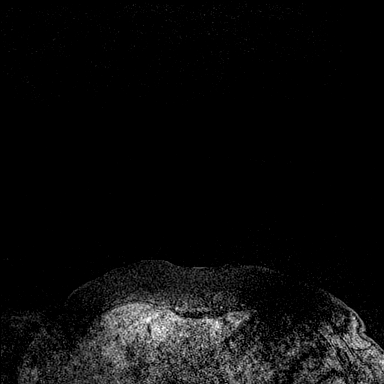
[im 36/144]
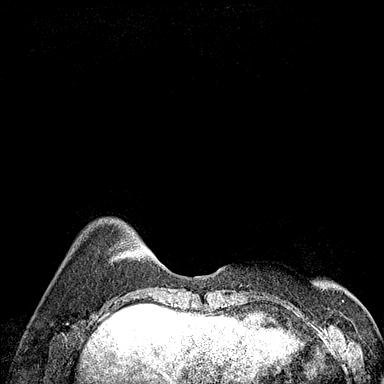
[im 72/144]
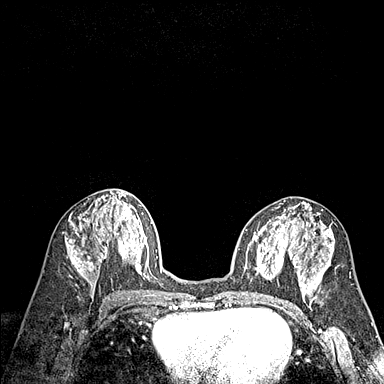
[im 108/144]
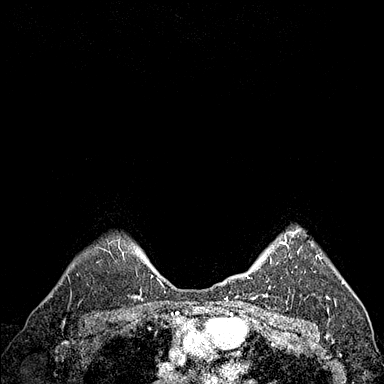
[im 144/144]
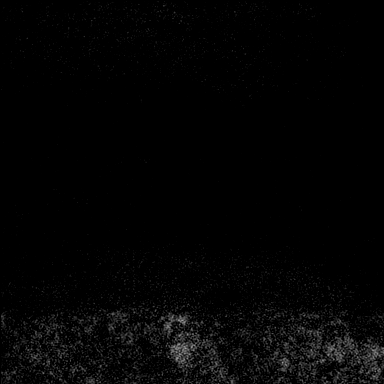

[Series 6: fl3d post-cm 20 · axial · 1.2mm · 0.89mm/px · z∈[-94,+78]mm · 5 of 144 slices shown (2 of 3)]
[im 1/144]
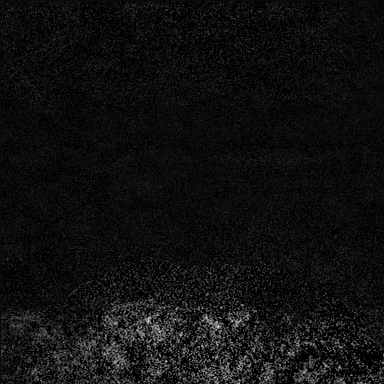
[im 36/144]
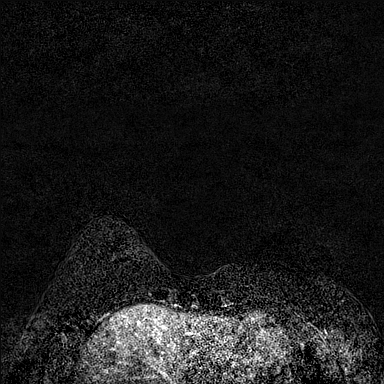
[im 72/144]
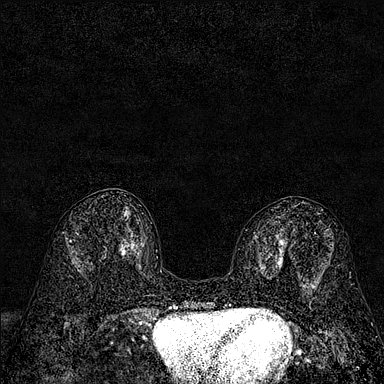
[im 108/144]
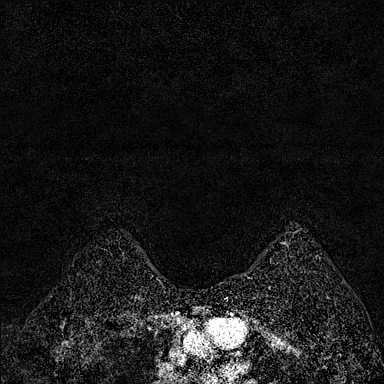
[im 144/144]
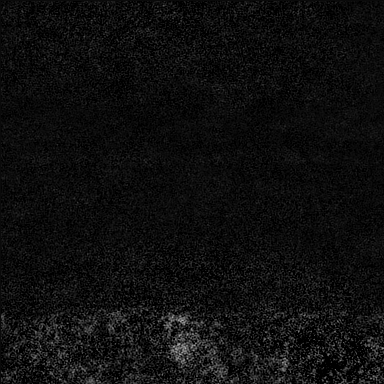

[Series 7: fl3d post-cm 20 · axial · 172.8mm · 0.89mm/px · 1 of 1 slices shown (3 of 3)]
[im 1/1]
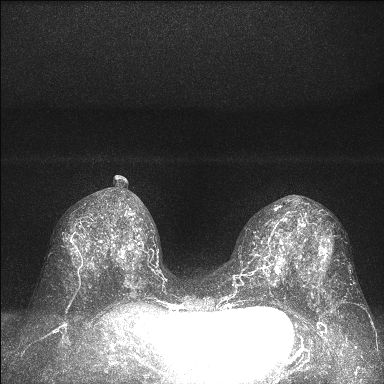

[Series 8: fl3d post-cm 3 · axial · 1.2mm · 0.89mm/px · z∈[-94,+78]mm · 6 of 144 slices shown (1 of 2)]
[im 1/144]
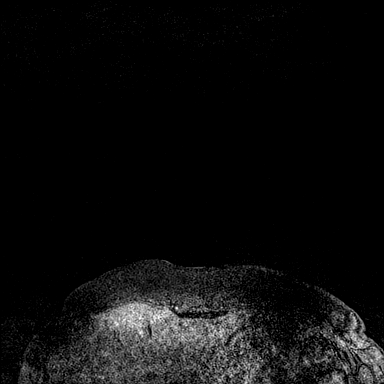
[im 29/144]
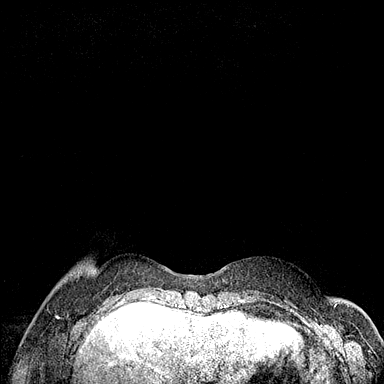
[im 58/144]
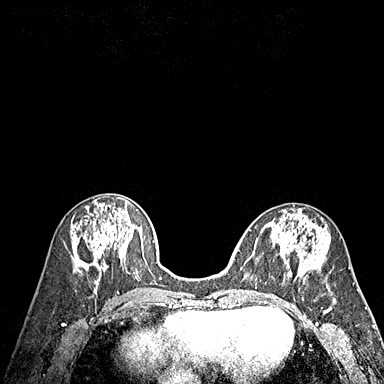
[im 86/144]
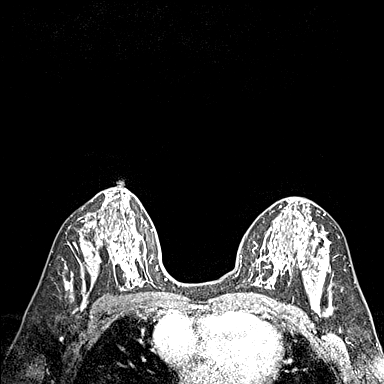
[im 115/144]
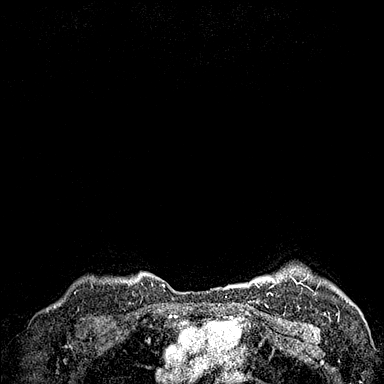
[im 144/144]
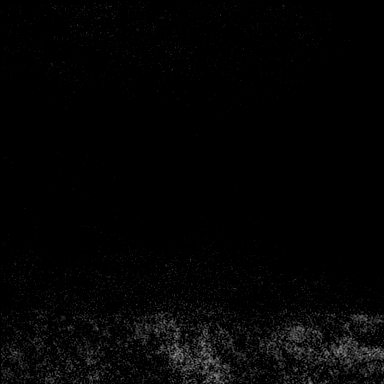

[Series 9: fl3d post-cm 3 · axial · 1.2mm · 0.89mm/px · z∈[-94,+8]mm · 4 of 144 slices shown (2 of 2)]
[im 1/144]
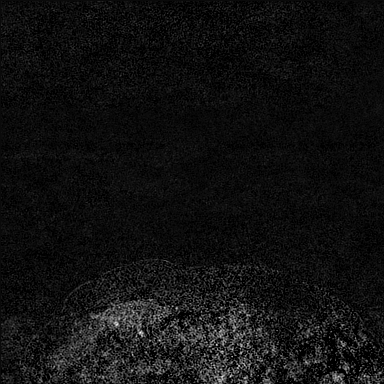
[im 29/144]
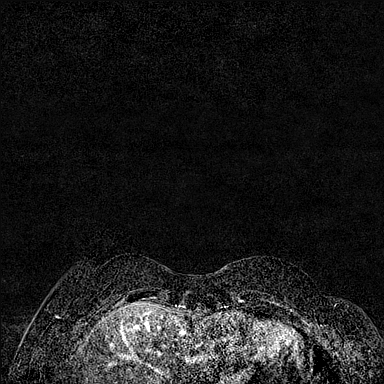
[im 58/144]
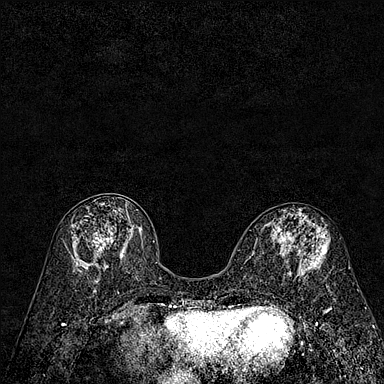
[im 86/144]
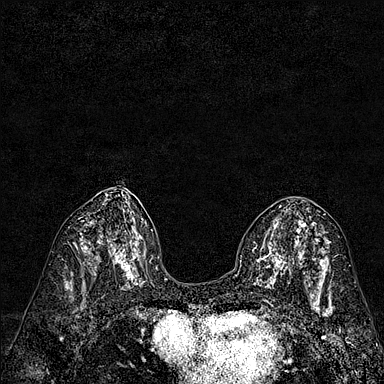

[32 of 48 positions shown; findings below may reference images not displayed]

Three-dimensional MR images were rendered by post-processing of the
original MR data on an independent workstation. The
three-dimensional MR images were interpreted, and findings are
reported in the following complete MRI report for this study. Three
dimensional images were evaluated at the independent interpreting
workstation using the DynaCAD thin client.
FINDINGS: Breast composition: c. Heterogeneous fibroglandular tissue.

Background parenchymal enhancement: Mild

Right breast: Small cysts. No mass or abnormal enhancement
suspicious for malignancy.

Left breast: Small cysts. The recently demonstrated 7 mm mass in the
slightly medial posterior left breast is again demonstrated,
measuring 9 x 4 mm on image number 54/9. This is slightly inferiorly
positioned, does not have a fatty hilum on today's images, does not
have increased signal intensity on the T2/IR images, low signal
intensity on the non-fat sat T1 weighted images and persistent
enhancement kinetics. This is oval and minimally irregular in shape.

The recently demonstrated 4 mm mass in the lower outer left breast
in the middle 3rd continues to measure 4 mm in maximum diameter on
image number 87/6 with plateau kinetics with slight washout. This is
oval and mildly irregular in shape.

Lymph nodes: No abnormal appearing lymph nodes.

Ancillary findings:  None.
IMPRESSION: 9 mm indeterminate mass in the posterior, slightly lower inner
quadrant of left breast and 4 mm indeterminate mass in the lower
outer quadrant of the left breast.

RECOMMENDATION:
MR guided core needle biopsies of the 2 indeterminate left breast
masses.

BI-RADS CATEGORY  4: Suspicious.

## 2021-12-15 MED ORDER — GADOBUTROL 1 MMOL/ML IV SOLN
8.0000 mL | Freq: Once | INTRAVENOUS | Status: AC | PRN
  Administered 2021-12-15: 8 mL via INTRAVENOUS

## 2021-12-15 NOTE — Progress Notes (Signed)
Full MRI recommend biopsy of left breast masses. Order placed today.

## 2021-12-22 ENCOUNTER — Ambulatory Visit
Admission: RE | Admit: 2021-12-22 | Discharge: 2021-12-22 | Disposition: A | Discharge status: Home / Self Care | Payer: No Typology Code available for payment source | Source: Ambulatory Visit | Attending: Nurse Practitioner | Admitting: Nurse Practitioner

## 2021-12-22 DIAGNOSIS — N6320 Unspecified lump in the left breast, unspecified quadrant: Secondary | ICD-10-CM

## 2021-12-22 IMAGING — MG MM BREAST LOCALIZATION CLIP
4 series · 4 of 12 positions shown · non-contrast
Comparison: Previous exam(s).

CLINICAL DATA: Status post MR guided core biopsy of 2 masses in the
LEFT breast.

EXAM:
3D DIAGNOSTIC LEFT MAMMOGRAM POST MRI BIOPSY x2

[L ML synth-2D]
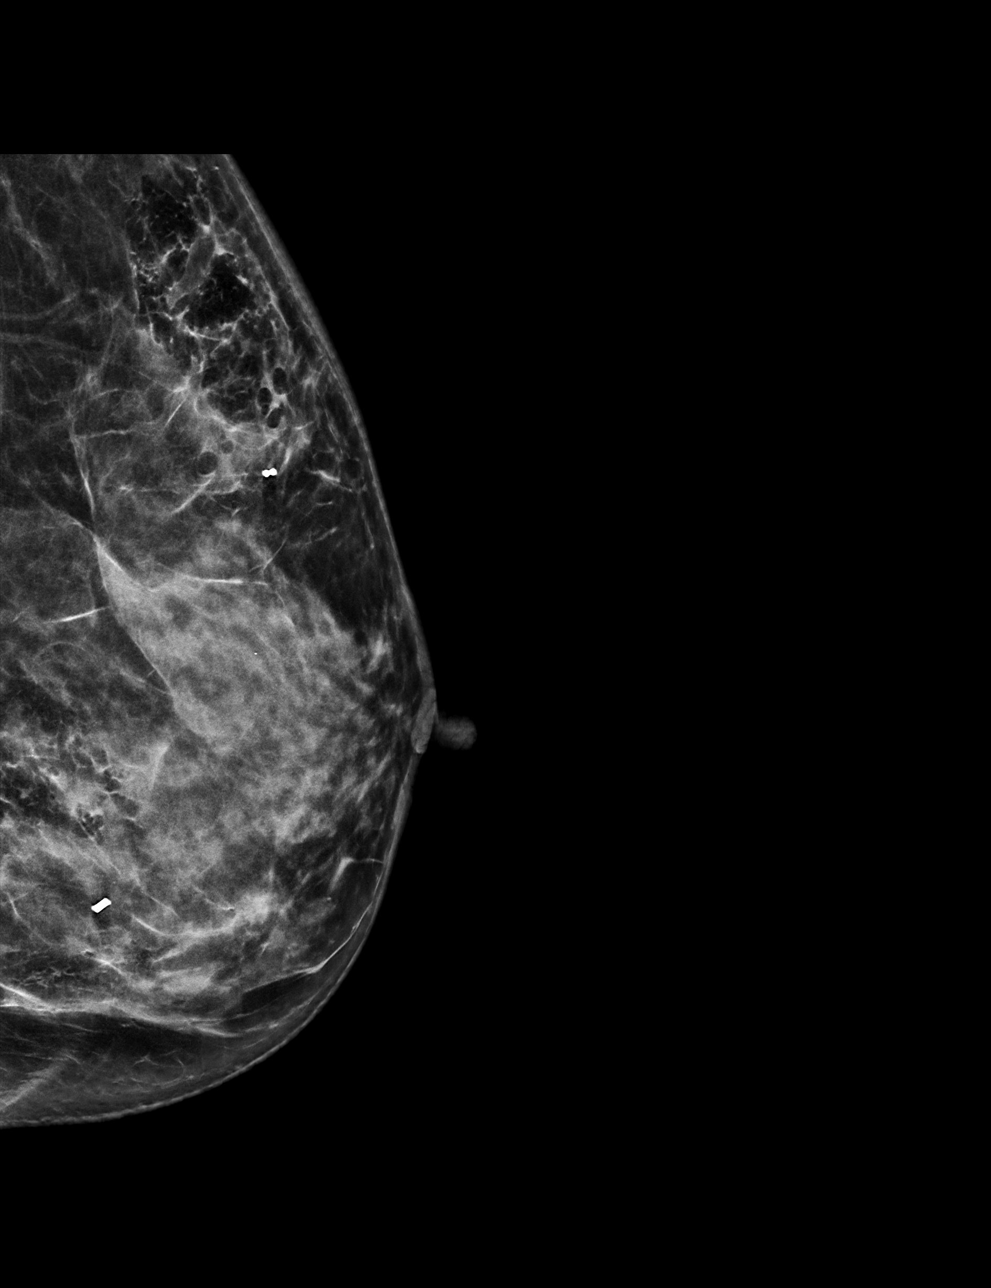

[L CC synth-2D]
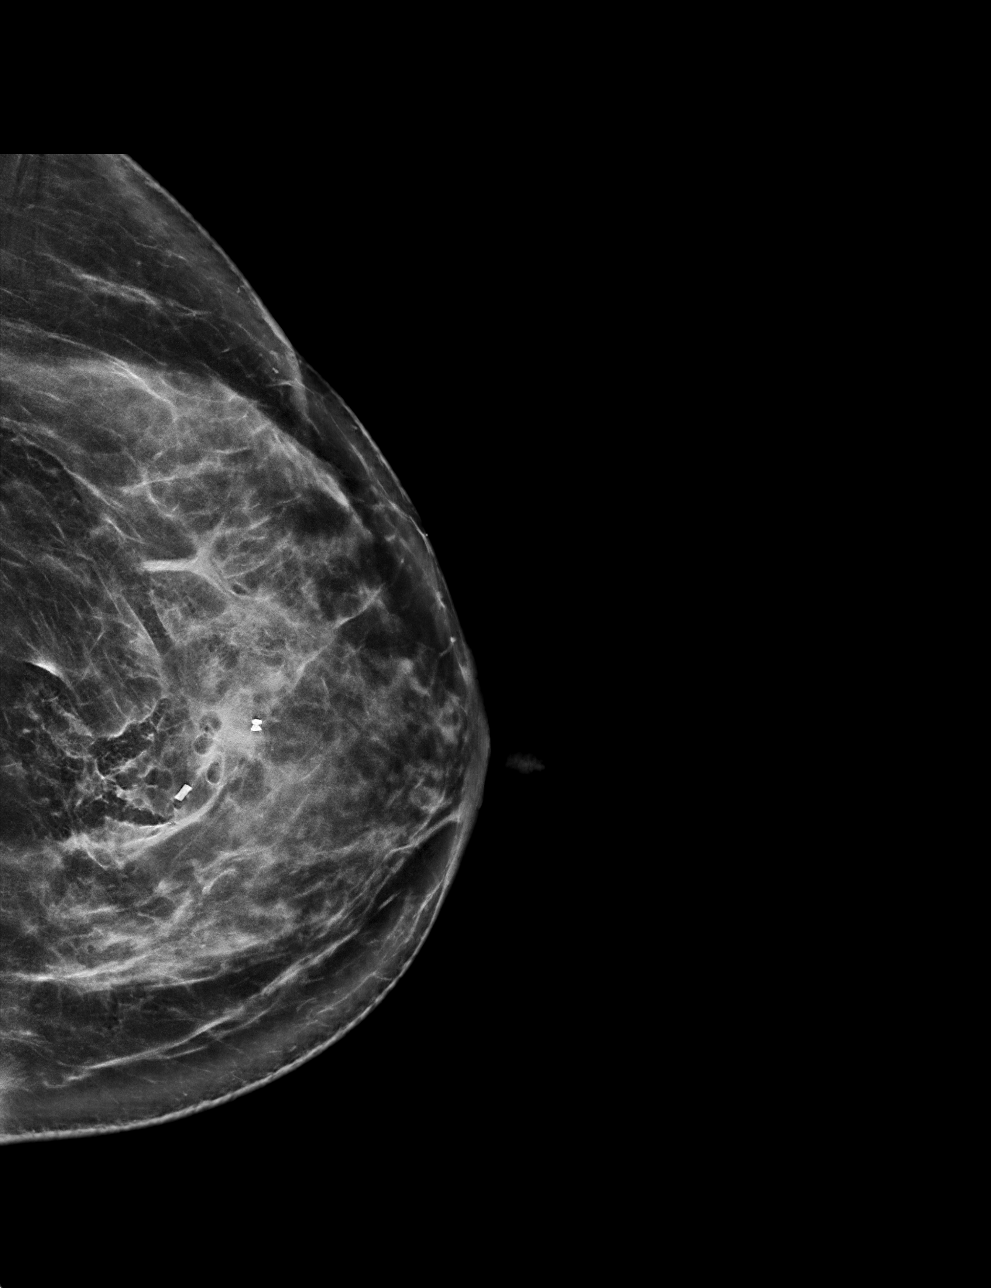

[L CC tomo · tomo slice 37/74.0]
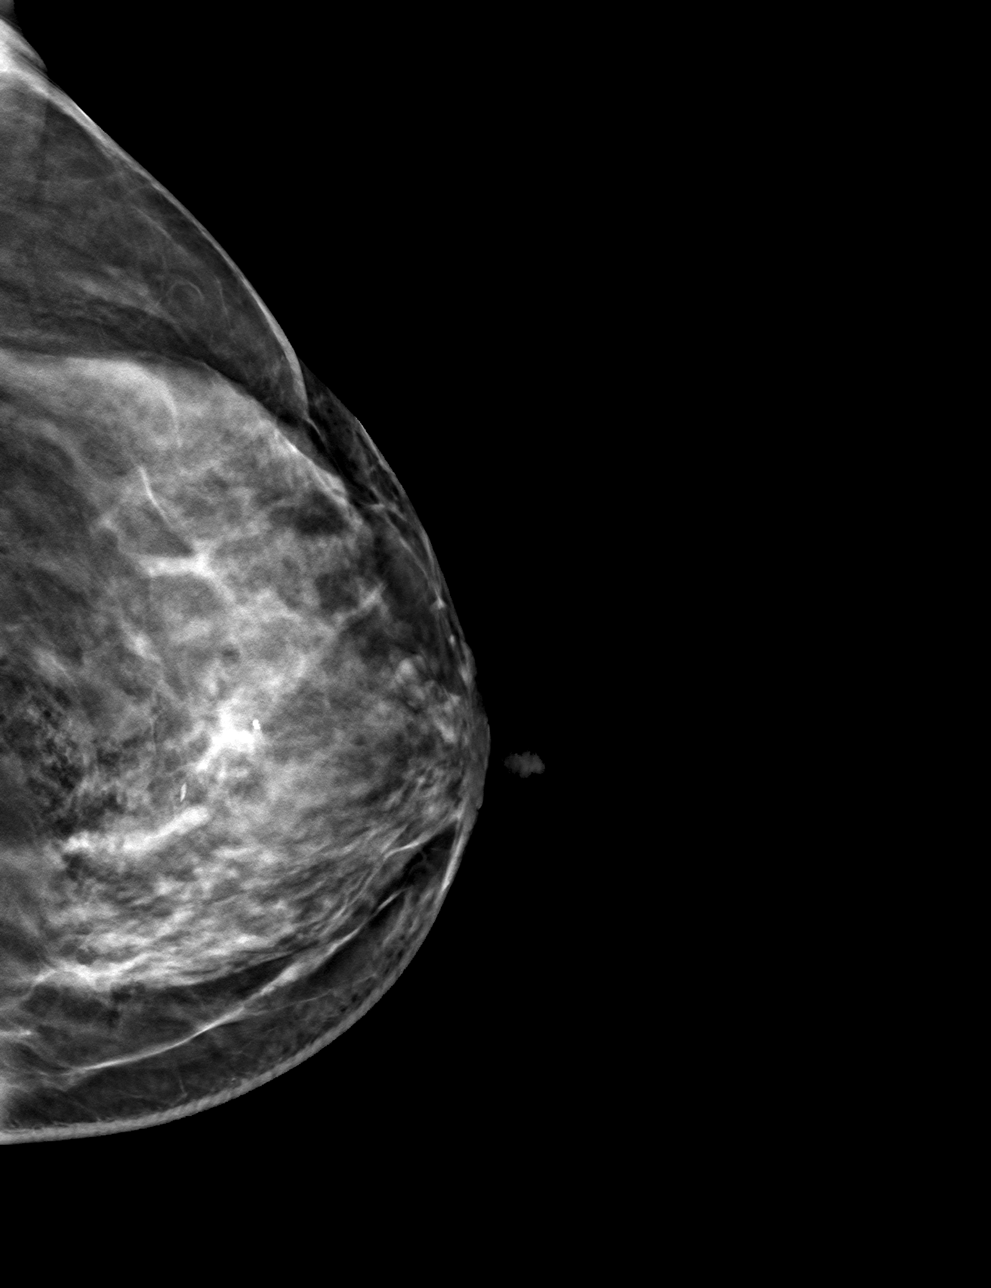

[L ML tomo · tomo slice 30/59.0]
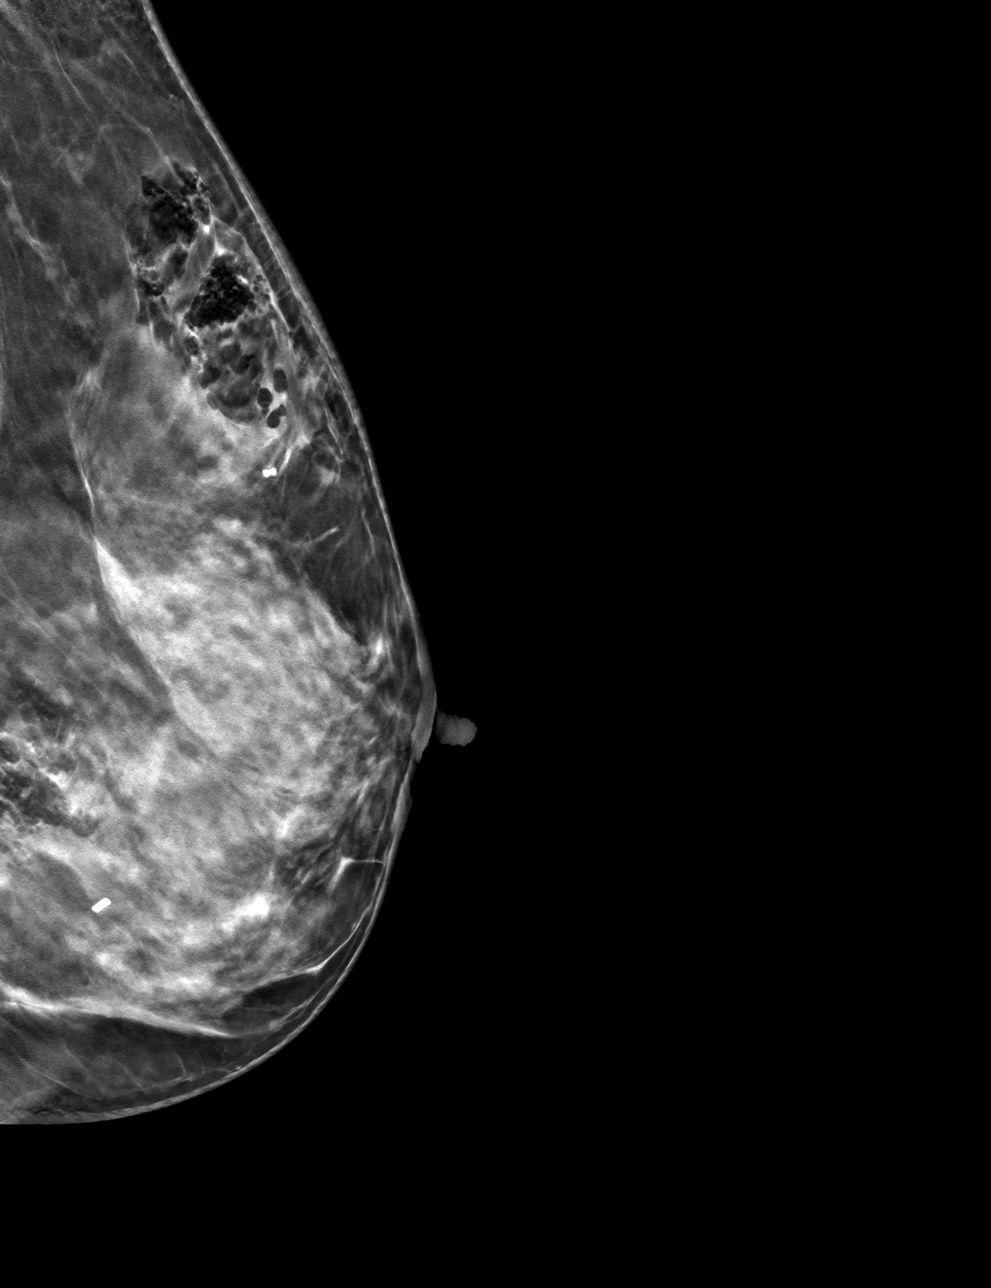

[4 of 12 positions shown; findings below may reference images not displayed]

FINDINGS: 3D Mammographic images were obtained following MRI guided biopsy of
enhancing mass in the MEDIAL portion of the LEFT breast and
placement of a barbell clip.

Following biopsy of enhancing mass in the LOWER OUTER QUADRANT of
the LEFT breast, a cylinder clip was placed and is identified in the
expected location.

The 2 clips are 8 centimeters apart on the true LATERAL projection.
IMPRESSION: Tissue marker clips are in the expected locations after biopsy.

Final Assessment: Post Procedure Mammograms for Marker Placement

## 2021-12-22 IMAGING — MR MR BREAST BX W LOC DEV EA ADD LESION IMAGE BX SPEC MR GUIDE*L*
7 of 10 series · 31 of 48 positions shown · IV contrast (7 ml gadavist)
Comparison: Prior studies
COMPARISON: Prior studies

Addendum:
CLINICAL DATA: Patient presents for MR guided core biopsy of 2
sites in the LEFT breast.

EXAM:
MRI GUIDED CORE NEEDLE BIOPSY OF THE LEFT BREAST x2
TECHNIQUE: Multiplanar, multisequence MR imaging of the LEFT breast was
performed both before and after administration of intravenous
contrast.
CONTRAST:  7 ml Gadavist

[Series 2: fiducial unilateral · sagittal · 2.0mm · 1.33mm/px · 3 of 52 slices shown]
[im 1/52]
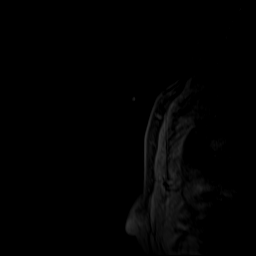
[im 26/52]
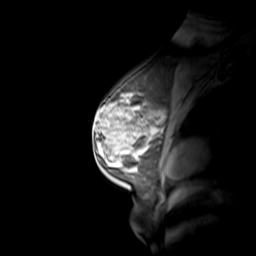
[im 52/52]
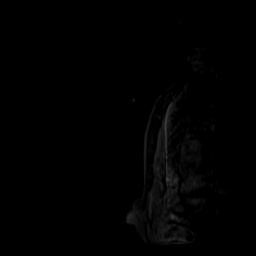

[Series 3: dynamic pre · axial · non-contrast · 1.3mm · 0.73mm/px · z∈[-59,+127]mm · 5 of 144 slices shown]
[im 1/144]
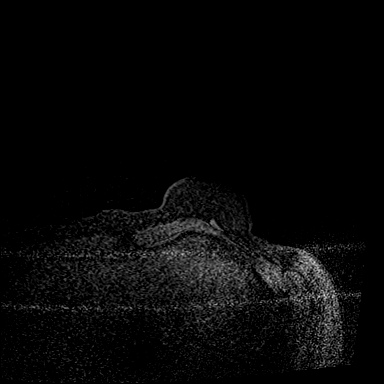
[im 36/144]
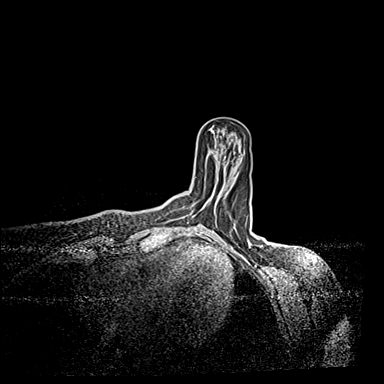
[im 72/144]
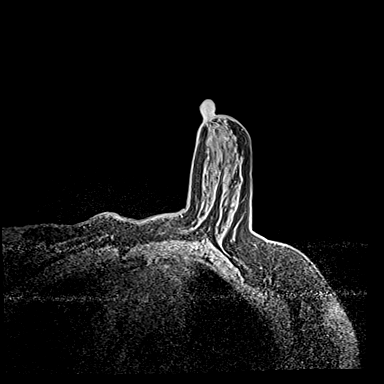
[im 108/144]
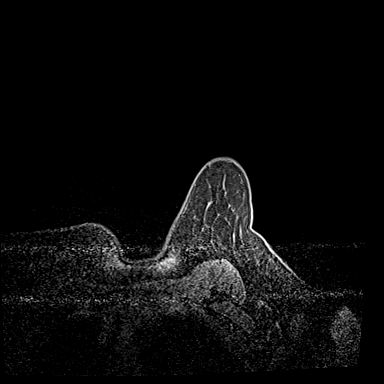
[im 144/144]
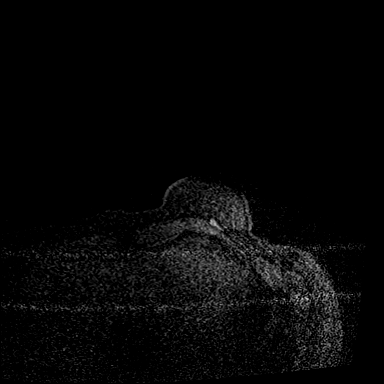

[Series 4: dynamic post 20 · axial · 1.3mm · 0.73mm/px · z∈[-59,+127]mm · 5 of 144 slices shown (1 of 2)]
[im 1/144]
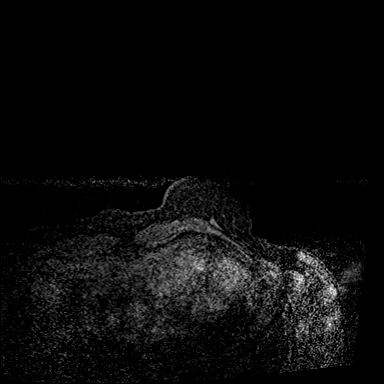
[im 36/144]
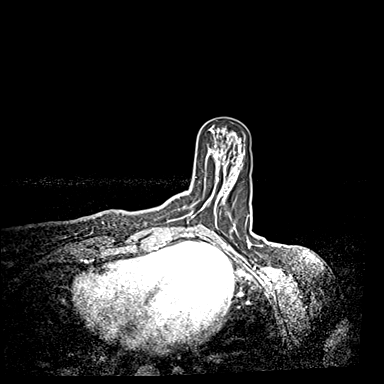
[im 72/144]
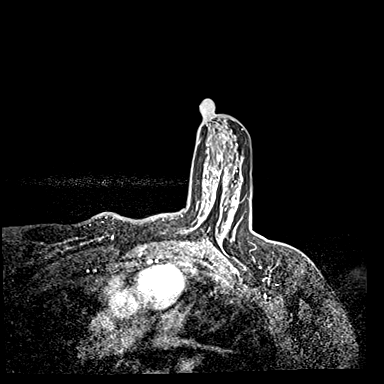
[im 108/144]
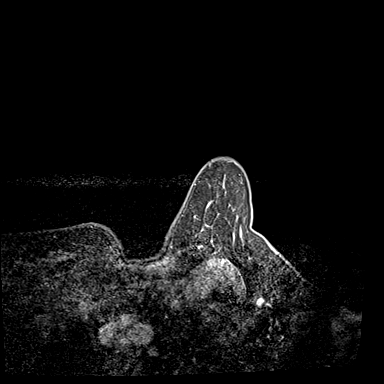
[im 144/144]
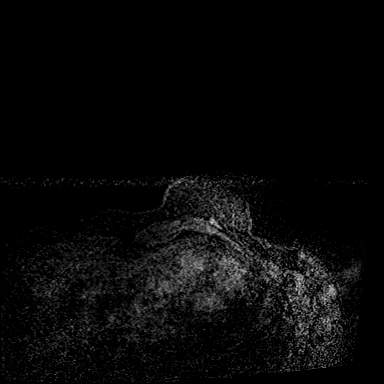

[Series 5: dynamic post 20 · axial · 1.3mm · 0.73mm/px · z∈[-59,+127]mm · 5 of 144 slices shown (2 of 2)]
[im 1/144]
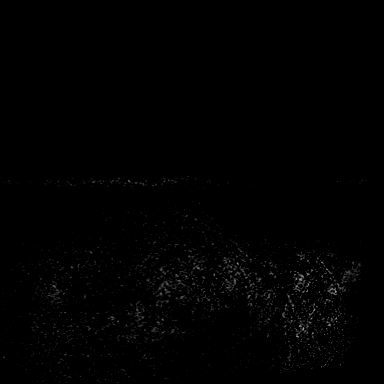
[im 36/144]
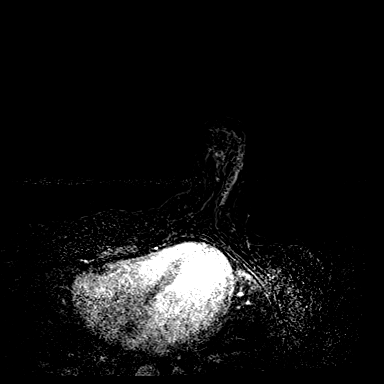
[im 72/144]
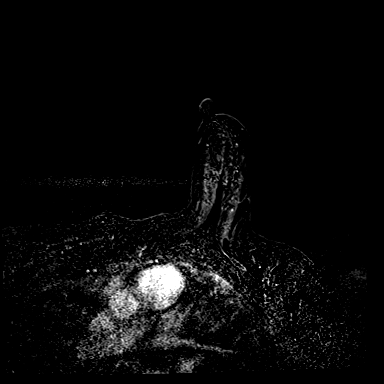
[im 108/144]
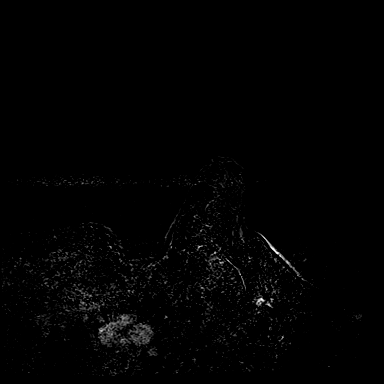
[im 144/144]
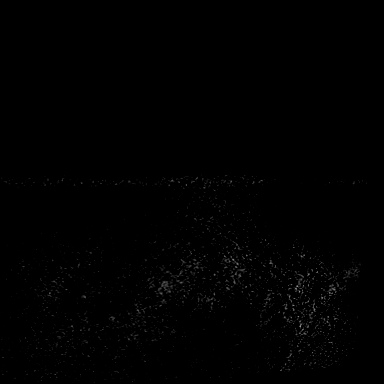

[Series 6: dynamic post 3 · axial · 1.3mm · 0.73mm/px · z∈[-59,+127]mm · 5 of 144 slices shown (1 of 2)]
[im 1/144]
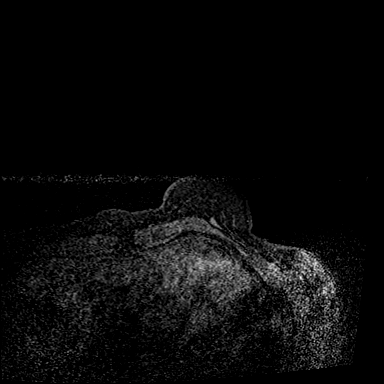
[im 36/144]
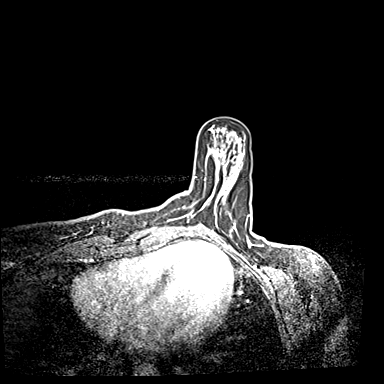
[im 72/144]
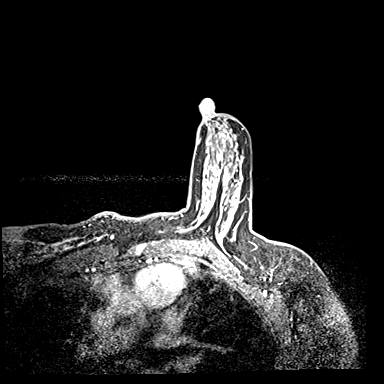
[im 108/144]
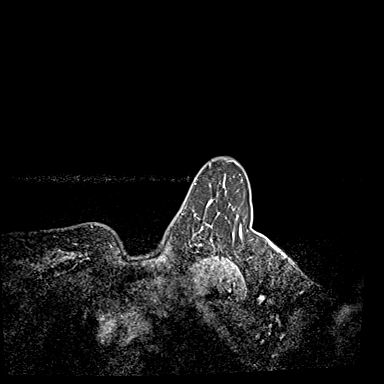
[im 144/144]
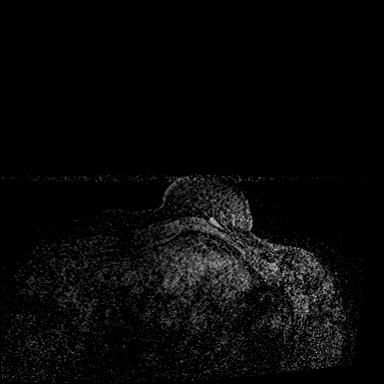

[Series 7: dynamic post 3 · axial · 1.3mm · 0.73mm/px · z∈[-59,+127]mm · 5 of 144 slices shown (2 of 2)]
[im 1/144]
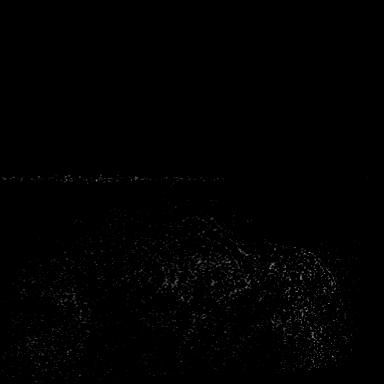
[im 36/144]
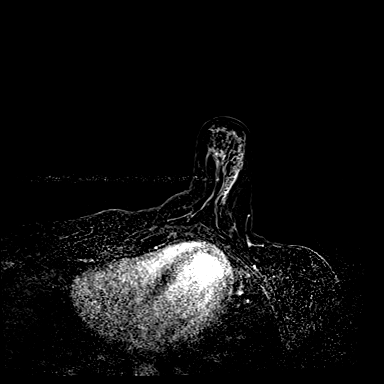
[im 72/144]
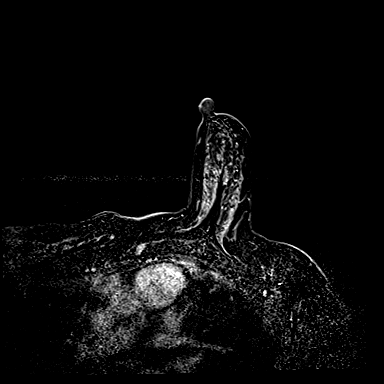
[im 108/144]
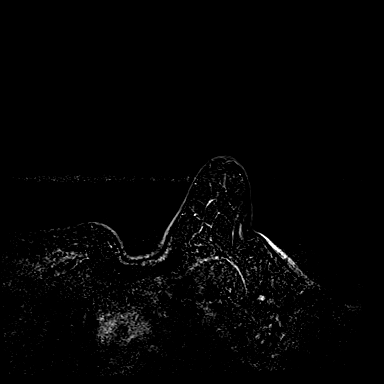
[im 144/144]
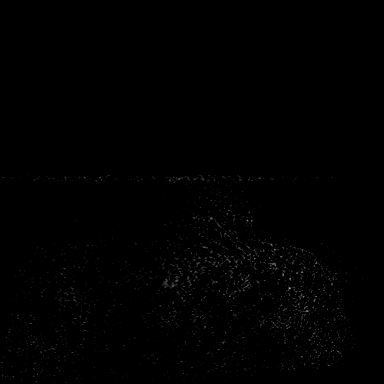

[Series 8: needle confirmation · axial · 1.3mm · 0.73mm/px · z∈[-59,+34]mm · 3 of 144 slices shown]
[im 1/144]
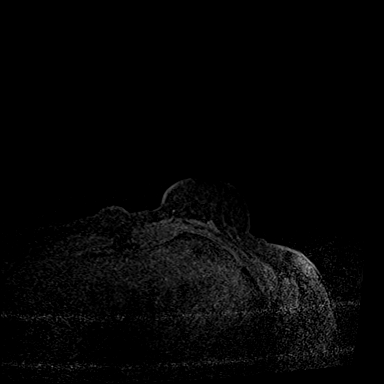
[im 36/144]
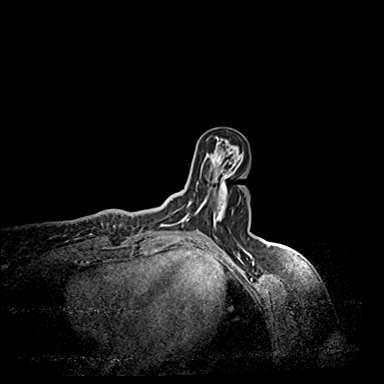
[im 72/144]
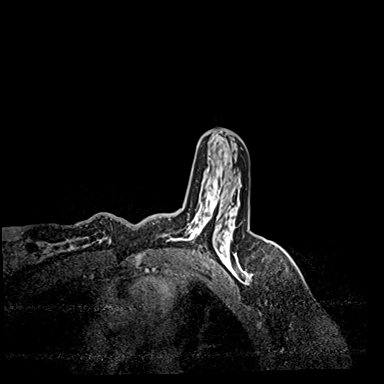

[31 of 48 positions shown; findings below may reference images not displayed]

FINDINGS: I met with the patient, and we discussed the procedure of MRI guided
biopsy, including risks, benefits, and alternatives. Specifically,
we discussed the risks of infection, bleeding, tissue injury, clip
migration, and inadequate sampling. Informed, written consent was
given. The usual time out protocol was performed immediately prior
to the procedure.

Site 1: Lesion quadrant: MEDIAL LEFT breast, barbell clip

Using sterile technique and lidocaine and lidocaine with epinephrine
as local anesthetic, under MRI guidance, a 9 gauge vacuum assisted
device was used to perform core needle biopsy of enhancing mass in
the MEDIAL portion of the LEFT breast using a LATERAL to MEDIAL
approach.

At the conclusion of the procedure, barbell tissue marker clip was
deployed into the biopsy cavity. Possible hematoma noted in the
UPPER INNER QUADRANT of the LEFT breast. A pressure dressing was
applied.

Site 2: Lesion quadrant: LOWER OUTER QUADRANT LEFT breast, cylinder
clip

Using sterile technique and lidocaine and lidocaine with epinephrine
as local anesthetic, under MRI guidance, a 9 gauge vacuum assisted
device was used to perform core needle biopsy of enhancing mass in
the LOWER OUTER QUADRANT of the LEFT breast using a LATERAL
approach.

At the conclusion of the procedure, cylinder tissue marker clip was
deployed into the biopsy cavity.

Follow-up LEFT 2-view mammogram was performed and dictated
separately.
IMPRESSION: MRI guided biopsy of 2 masses in the LEFT breast. Possible
developing hematoma in the UPPER INNER QUADRANT of the LEFT breast.
Pressure dressing was applied.

ADDENDUM:
Pathology revealed BENIGN BREAST TISSUE WITH FOCAL FIBROCYSTIC
CHANGES of the LEFT breast, medial, (barbell clip). This was found
to be concordant by Dr. PALBERG.

Pathology revealed BENIGN BREAST TISSUE WITH FIBROCYSTIC CHANGES
INCLUDING APOCRINE METAPLASIA of the LEFT breast, lower outer,
(cylinder clip). This was found to be concordant by Dr. PALBERG
PALBERG.

Pathology results were discussed with the patient by telephone. The
patient reported doing well after the biopsies with tenderness,
bleeding and bruising at the sites. Post biopsy instructions and
care were reviewed and questions were answered. The patient was
encouraged to call The [REDACTED] for any
additional concerns. My direct phone number was provided.

The patient was instructed to return for a bilateral breast MRI in 6
months, per protocol.

Pathology results reported by PALBERG, RN on [DATE].

*** End of Addendum ***
FINDINGS: I met with the patient, and we discussed the procedure of MRI guided
biopsy, including risks, benefits, and alternatives. Specifically,
we discussed the risks of infection, bleeding, tissue injury, clip
migration, and inadequate sampling. Informed, written consent was
given. The usual time out protocol was performed immediately prior
to the procedure.

Site 1: Lesion quadrant: MEDIAL LEFT breast, barbell clip

Using sterile technique and lidocaine and lidocaine with epinephrine
as local anesthetic, under MRI guidance, a 9 gauge vacuum assisted
device was used to perform core needle biopsy of enhancing mass in
the MEDIAL portion of the LEFT breast using a LATERAL to MEDIAL
approach.

At the conclusion of the procedure, barbell tissue marker clip was
deployed into the biopsy cavity. Possible hematoma noted in the
UPPER INNER QUADRANT of the LEFT breast. A pressure dressing was
applied.

Site 2: Lesion quadrant: LOWER OUTER QUADRANT LEFT breast, cylinder
clip

Using sterile technique and lidocaine and lidocaine with epinephrine
as local anesthetic, under MRI guidance, a 9 gauge vacuum assisted
device was used to perform core needle biopsy of enhancing mass in
the LOWER OUTER QUADRANT of the LEFT breast using a LATERAL
approach.

At the conclusion of the procedure, cylinder tissue marker clip was
deployed into the biopsy cavity.

Follow-up LEFT 2-view mammogram was performed and dictated
separately.
IMPRESSION: MRI guided biopsy of 2 masses in the LEFT breast. Possible
developing hematoma in the UPPER INNER QUADRANT of the LEFT breast.
Pressure dressing was applied.

## 2021-12-22 MED ORDER — GADOBUTROL 1 MMOL/ML IV SOLN
7.0000 mL | Freq: Once | INTRAVENOUS | Status: AC | PRN
  Administered 2021-12-22: 7 mL via INTRAVENOUS

## 2022-01-03 ENCOUNTER — Other Ambulatory Visit: Payer: No Typology Code available for payment source

## 2022-05-12 ENCOUNTER — Other Ambulatory Visit: Payer: Self-pay | Admitting: Nurse Practitioner

## 2022-05-12 DIAGNOSIS — N6320 Unspecified lump in the left breast, unspecified quadrant: Secondary | ICD-10-CM

## 2022-05-12 DIAGNOSIS — Z1239 Encounter for other screening for malignant neoplasm of breast: Secondary | ICD-10-CM

## 2022-05-19 ENCOUNTER — Encounter: Payer: Self-pay | Admitting: Nurse Practitioner

## 2022-06-21 ENCOUNTER — Telehealth: Payer: Self-pay | Admitting: Nurse Practitioner

## 2022-06-21 NOTE — Telephone Encounter (Signed)
Edmon Crape from [facility] would like for you to give her a call @ [phone]

## 2022-06-22 ENCOUNTER — Encounter: Payer: Self-pay | Admitting: Nurse Practitioner

## 2022-06-23 ENCOUNTER — Other Ambulatory Visit: Payer: No Typology Code available for payment source

## 2022-06-23 NOTE — Telephone Encounter (Signed)
Called [facility]. Researched pt's  chart looks like an order was placed for imaging but was placed incorrectly. I have sent over clinicals and awaiting approval. Once approved I will contact Dyke Maes for rescheduling

## 2022-07-25 ENCOUNTER — Encounter: Payer: Self-pay | Admitting: Family Medicine

## 2022-07-25 ENCOUNTER — Ambulatory Visit (INDEPENDENT_AMBULATORY_CARE_PROVIDER_SITE_OTHER): Payer: No Typology Code available for payment source | Admitting: Family Medicine

## 2022-07-25 VITALS — BP 104/76 | HR 92 | Temp 98.0°F | Ht 68.0 in | Wt 178.0 lb

## 2022-07-25 DIAGNOSIS — J069 Acute upper respiratory infection, unspecified: Secondary | ICD-10-CM | POA: Diagnosis not present

## 2022-07-25 NOTE — Progress Notes (Signed)
[facility] [address] [place] [address] Dept: [phone] Dept Fax: [phone]  Office Visit  Subjective:    Patient ID: Sarah Parks, female    DOB: 1981/01/02, 42 y.o..   MRN: [id number]  Chief Complaint  Patient presents with   Acute Visit    Coughing, no energy, appetite loss for 5 days    History of Present Illness:  Patient is in today complaining of a 5-day history of cough, lack of energy, and poor appetite. She has had some minor rhinorrhea. She denies any nausea/vomiting, though yesterday her stomach was upset. she thinks this was more related to the OTC meds she was taking. She has taken Theraflu and Alka Seltzer Cold & Flu. She also has used a home remedy of honey/red onion.garlic for her cough.  Her daughter had also been sick briefly.  Past Medical History: Patient Active Problem List   Diagnosis Date Noted   Chest wall pain 10/14/2021   Acute cough 07/07/2021   Sensation of fullness in both ears 07/07/2021   History of influenza 07/07/2021   PND (post-nasal drip) 07/07/2021   Breast cancer screening, high risk patient 12/10/2019   Genetic testing 11/27/2019   Chronic idiopathic constipation 01/24/2019   Vitamin B12 deficiency 04/27/2017   Back pain 01/27/2017   Family history of malignant neoplasm of breast 07/28/2016   Missed abortion 05/14/2014   Past Surgical History:  Procedure Laterality Date   BREAST BIOPSY Left    Fibroid tissue 2003   DILATION AND EVACUATION N/A 05/14/2014   Procedure: DILATATION AND EVACUATION;  Surgeon: Cheri Fowler, MD;  Location: [facility];  Service: Gynecology;  Laterality: N/A;   DILATION AND EVACUATION N/A 09/27/2017   Procedure: DILATATION AND EVACUATION;  Surgeon: Cheri Fowler, MD;  Location: [facility];  Service: Gynecology;  Laterality: N/A;   INDUCED ABORTION     LAPAROSCOPIC BILATERAL SALPINGECTOMY Bilateral 09/27/2017   Procedure: LAPAROSCOPIC  BILATERAL SALPINGECTOMY;  Surgeon: Cheri Fowler, MD;  Location: [facility];  Service: Gynecology;  Laterality: Bilateral;   WISDOM TOOTH EXTRACTION     Family History  Problem Relation Age of Onset   Hypertension Brother    Dementia Maternal Grandmother    Cancer Maternal Grandfather        unknown   Breast cancer Mother 73       second dx at 38   HIV/AIDS Father    Cancer Maternal Uncle        unknown dx   Breast cancer Other        MGF's sister   Colon cancer Neg Hx    Outpatient Medications Prior to Visit  Medication Sig Dispense Refill   ELDERBERRY PO Take by mouth.     Multiple Vitamin (MULTIVITAMIN) tablet Take 1 tablet by mouth daily.     No facility-administered medications prior to visit.   No Known Allergies    Objective:   Today's Vitals   07/25/22 1104  BP: 104/76  Pulse: 92  Temp: 98 F (36.7 C)  TempSrc: Tympanic  SpO2: 99%  Weight: 178 lb (80.7 kg)  Height: 5\' 8"  (1.727 m)   Body mass index is 27.06 kg/m.   General: Well developed, well nourished. No acute distress. HEENT: Normocephalic, non-traumatic.Conjunctiva clear.  External ears normal. EAC and TMs normal   bilaterally. Nose clear without congestion or rhinorrhea. Mucous membranes moist. Oropharynx clear. Good   dentition. Neck: Supple. No lymphadenopathy. No thyromegaly. Lungs: Clear to auscultation bilaterally. No  wheezing, rales or rhonchi. Psych: Alert and oriented. Normal mood and affect.  Health Maintenance Due  Topic Date Due   DTaP/Tdap/Td (2 - Td or Tdap) 07/05/2020   PAP SMEAR-Modifier  03/02/2022   Lab Results POCT Covid: Neg. POCT Influenza A& B: Neg.    Assessment & Plan:   1. Viral URI with cough Discussed home care for viral illness, including rest, pushing fluids, and OTC medications as needed for symptom relief. Recommend hot tea with honey for sore throat symptoms. Follow-up if needed for worsening or persistent symptoms.   Return if symptoms worsen or fail to  improve.   Haydee Salter, MD

## 2022-08-30 ENCOUNTER — Ambulatory Visit
Admission: RE | Admit: 2022-08-30 | Discharge: 2022-08-30 | Disposition: A | Discharge status: Home / Self Care | Payer: No Typology Code available for payment source | Source: Ambulatory Visit | Attending: Nurse Practitioner | Admitting: Nurse Practitioner

## 2022-08-30 DIAGNOSIS — Z1239 Encounter for other screening for malignant neoplasm of breast: Secondary | ICD-10-CM

## 2022-08-30 DIAGNOSIS — N6320 Unspecified lump in the left breast, unspecified quadrant: Secondary | ICD-10-CM

## 2022-08-30 MED ORDER — GADOPICLENOL 0.5 MMOL/ML IV SOLN
7.5000 mL | Freq: Once | INTRAVENOUS | Status: AC | PRN
  Administered 2022-08-30: 7.5 mL via INTRAVENOUS

## 2022-08-31 ENCOUNTER — Telehealth: Payer: Self-pay | Admitting: Nurse Practitioner

## 2022-08-31 ENCOUNTER — Encounter: Payer: Self-pay | Admitting: Nurse Practitioner

## 2022-08-31 ENCOUNTER — Other Ambulatory Visit: Payer: Self-pay | Admitting: Nurse Practitioner

## 2022-08-31 DIAGNOSIS — R928 Other abnormal and inconclusive findings on diagnostic imaging of breast: Secondary | ICD-10-CM

## 2022-08-31 NOTE — Telephone Encounter (Signed)
See note

## 2022-08-31 NOTE — Telephone Encounter (Signed)
Pt does not want to schedule a physical until she figures out what is going on with her biopsies. She is wanting to know if she should have the second one done. Please advise

## 2022-08-31 NOTE — Telephone Encounter (Signed)
Caller Name: Chrishonda Call back phone #: [phone]  Reason for Call: Pt got her MRI results and ids concerned about the results. She would like a call to get your advice.

## 2022-09-02 NOTE — Progress Notes (Signed)
Abnormal: proceed with biopsy as recommended

## 2022-09-07 ENCOUNTER — Ambulatory Visit
Admission: RE | Admit: 2022-09-07 | Discharge: 2022-09-07 | Disposition: A | Discharge status: Home / Self Care | Payer: No Typology Code available for payment source | Source: Ambulatory Visit | Attending: Nurse Practitioner | Admitting: Nurse Practitioner

## 2022-09-07 DIAGNOSIS — R928 Other abnormal and inconclusive findings on diagnostic imaging of breast: Secondary | ICD-10-CM

## 2022-09-07 MED ORDER — GADOPICLENOL 0.5 MMOL/ML IV SOLN
7.0000 mL | Freq: Once | INTRAVENOUS | Status: AC | PRN
  Administered 2022-09-07: 7 mL via INTRAVENOUS

## 2022-09-13 ENCOUNTER — Telehealth: Payer: No Typology Code available for payment source | Admitting: Physician Assistant

## 2022-09-13 DIAGNOSIS — J019 Acute sinusitis, unspecified: Secondary | ICD-10-CM

## 2022-09-13 DIAGNOSIS — B9689 Other specified bacterial agents as the cause of diseases classified elsewhere: Secondary | ICD-10-CM | POA: Diagnosis not present

## 2022-09-13 MED ORDER — AMOXICILLIN-POT CLAVULANATE 875-125 MG PO TABS: 1.0000 | ORAL_TABLET | Freq: Two times a day (BID) | ORAL | 0 refills | Status: DC

## 2022-09-13 MED ORDER — FLONASE SENSIMIST 27.5 MCG/SPRAY NA SUSP: 2.0000 | Freq: Every day | NASAL | 0 refills | Status: AC

## 2022-09-13 NOTE — Progress Notes (Signed)
Virtual Visit Consent   Sarah Parks, you are scheduled for a virtual visit with a [facility] provider today. Just as with appointments in the office, your consent must be obtained to participate. Your consent will be active for this visit and any virtual visit you may have with one of our providers in the next 365 days. If you have a MyChart account, a copy of this consent can be sent to you electronically.  As this is a virtual visit, video technology does not allow for your provider to perform a traditional examination. This may limit your provider's ability to fully assess your condition. If your provider identifies any concerns that need to be evaluated in person or the need to arrange testing (such as labs, EKG, etc.), we will make arrangements to do so. Although advances in technology are sophisticated, we cannot ensure that it will always work on either your end or our end. If the connection with a video visit is poor, the visit may have to be switched to a telephone visit. With either a video or telephone visit, we are not always able to ensure that we have a secure connection.  By engaging in this virtual visit, you consent to the provision of healthcare and authorize for your insurance to be billed (if applicable) for the services provided during this visit. Depending on your insurance coverage, you may receive a charge related to this service.  I need to obtain your verbal consent now. Are you willing to proceed with your visit today? KENZY HYSMITH has provided verbal consent on 09/13/2022 for a virtual visit (video or telephone). Mar Daring, PA-C  Date: 09/13/2022 1:57 PM  Virtual Visit via Video Note   I, Mar Daring, connected with  Sarah Parks  ([id number], [birth year]) on 09/13/22 at  1:45 PM EDT by a video-enabled telemedicine application and verified that I am speaking with the correct person using two identifiers.  Location: Patient: Virtual Visit  Location Patient: Home Provider: Virtual Visit Location Provider: Home Office   I discussed the limitations of evaluation and management by telemedicine and the availability of in person appointments. The patient expressed understanding and agreed to proceed.    History of Present Illness: Sarah Parks is a 42 y.o. who identifies as a female who was assigned female at birth, and is being seen today for possible sinus infection.  HPI: Sinusitis This is a new problem. The current episode started 1 to 4 weeks ago. The problem has been gradually worsening since onset. There has been no fever. Associated symptoms include congestion, coughing, ear pain (with blowing nose), headaches, sinus pressure, sneezing and a sore throat. Pertinent negatives include no chills. Past treatments include acetaminophen and saline nose sprays (honey, nyquil). The treatment provided no relief.     Problems:  Patient Active Problem List   Diagnosis Date Noted   Chest wall pain 10/14/2021   Acute cough 07/07/2021   Sensation of fullness in both ears 07/07/2021   History of influenza 07/07/2021   PND (post-nasal drip) 07/07/2021   Breast cancer screening, high risk patient 12/10/2019   Genetic testing 11/27/2019   Chronic idiopathic constipation 01/24/2019   Vitamin B12 deficiency 04/27/2017   Back pain 01/27/2017   Family history of malignant neoplasm of breast 07/28/2016   Missed abortion 05/14/2014    Allergies: No Known Allergies Medications:  Current Outpatient Medications:    amoxicillin-clavulanate (AUGMENTIN) 875-125 MG tablet, Take 1 tablet by mouth 2 (two) times  daily., Disp: 14 tablet, Rfl: 0   fluticasone (FLONASE SENSIMIST) 27.5 MCG/SPRAY nasal spray, Place 2 sprays into the nose daily., Disp: 10 g, Rfl: 0   ELDERBERRY PO, Take by mouth., Disp: , Rfl:    Multiple Vitamin (MULTIVITAMIN) tablet, Take 1 tablet by mouth daily., Disp: , Rfl:    Observations/Objective: Patient is  well-developed, well-nourished in no acute distress.  Resting comfortably at home.  Head is normocephalic, atraumatic.  No labored breathing.  Speech is clear and coherent with logical content.  Patient is alert and oriented at baseline.    Assessment and Plan: 1. Acute bacterial sinusitis - amoxicillin-clavulanate (AUGMENTIN) 875-125 MG tablet; Take 1 tablet by mouth 2 (two) times daily.  Dispense: 14 tablet; Refill: 0 - fluticasone (FLONASE SENSIMIST) 27.5 MCG/SPRAY nasal spray; Place 2 sprays into the nose daily.  Dispense: 10 g; Refill: 0  - Worsening symptoms that have not responded to OTC medications.  - Will give Augmentin and Flonase - Continue allergy medications.  - Steam and humidifier can help - Stay well hydrated and get plenty of rest.  - Seek in person evaluation if no symptom improvement or if symptoms worsen   Follow Up Instructions: I discussed the assessment and treatment plan with the patient. The patient was provided an opportunity to ask questions and all were answered. The patient agreed with the plan and demonstrated an understanding of the instructions.  A copy of instructions were sent to the patient via MyChart unless otherwise noted below.    The patient was advised to call back or seek an in-person evaluation if the symptoms worsen or if the condition fails to improve as anticipated.  Time:  I spent 11 minutes with the patient via telehealth technology discussing the above problems/concerns.    Mar Daring, PA-C

## 2022-09-13 NOTE — Patient Instructions (Signed)
Jenelle Mages, thank you for joining Mar Daring, PA-C for today's virtual visit.  While this provider is not your primary care provider (PCP), if your PCP is located in our provider database this encounter information will be shared with them immediately following your visit.   [facility] account gives you access to today's visit and all your visits, tests, and labs performed at [facility] " click here if you don't have a [facility] account or go to [email or web address]  Consent: (Patient) JAKEIRA MCMURREY provided verbal consent for this virtual visit at the beginning of the encounter.  Current Medications:  Current Outpatient Medications:    amoxicillin-clavulanate (AUGMENTIN) 875-125 MG tablet, Take 1 tablet by mouth 2 (two) times daily., Disp: 14 tablet, Rfl: 0   fluticasone (FLONASE SENSIMIST) 27.5 MCG/SPRAY nasal spray, Place 2 sprays into the nose daily., Disp: 10 g, Rfl: 0   ELDERBERRY PO, Take by mouth., Disp: , Rfl:    Multiple Vitamin (MULTIVITAMIN) tablet, Take 1 tablet by mouth daily., Disp: , Rfl:    Medications ordered in this encounter:  Meds ordered this encounter  Medications   amoxicillin-clavulanate (AUGMENTIN) 875-125 MG tablet    Sig: Take 1 tablet by mouth 2 (two) times daily.    Dispense:  14 tablet    Refill:  0    Order Specific Question:   Supervising Provider    Answer:   Chase Picket [id number]   fluticasone (FLONASE SENSIMIST) 27.5 MCG/SPRAY nasal spray    Sig: Place 2 sprays into the nose daily.    Dispense:  10 g    Refill:  0    Order Specific Question:   Supervising Provider    Answer:   Chase Picket [id number]     *If you need refills on other medications prior to your next appointment, please contact your pharmacy*  Follow-Up: Call back or seek an in-person evaluation if the symptoms worsen or if the condition fails to improve as anticipated.  [place] [phone]  Other Instructions Sinus Infection, Adult A sinus infection, also called sinusitis, is inflammation of your sinuses. Sinuses are hollow spaces in the bones around your face. Your sinuses are located: Around your eyes. In the middle of your forehead. Behind your nose. In your cheekbones. Mucus normally drains out of your sinuses. When your nasal tissues become inflamed or swollen, mucus can become trapped or blocked. This allows bacteria, viruses, and fungi to grow, which leads to infection. Most infections of the sinuses are caused by a virus. A sinus infection can develop quickly. It can last for up to 4 weeks (acute) or for more than 12 weeks (chronic). A sinus infection often develops after a cold. What are the causes? This condition is caused by anything that creates swelling in the sinuses or stops mucus from draining. This includes: Allergies. Asthma. Infection from bacteria or viruses. Deformities or blockages in your nose or sinuses. Abnormal growths in the nose (nasal polyps). Pollutants, such as chemicals or irritants in the air. Infection from fungi. This is rare. What increases the risk? You are more likely to develop this condition if you: Have a weak body defense system (immune system). Do a lot of swimming or diving. Overuse nasal sprays. Smoke. What are the signs or symptoms? The main symptoms of this condition are pain and a feeling of pressure around the affected sinuses. Other symptoms include: Stuffy nose or congestion that makes it difficult to  breathe through your nose. Thick yellow or greenish drainage from your nose. Tenderness, swelling, and warmth over the affected sinuses. A cough that may get worse at night. Decreased sense of smell and taste. Extra mucus that collects in the throat or the back of the nose (postnasal drip) causing a sore throat or bad breath. Tiredness (fatigue). Fever. How is this diagnosed? This condition is diagnosed based  on: Your symptoms. Your medical history. A physical exam. Tests to find out if your condition is acute or chronic. This may include: Checking your nose for nasal polyps. Viewing your sinuses using a device that has a light (endoscope). Testing for allergies or bacteria. Imaging tests, such as an MRI or CT scan. In rare cases, a bone biopsy may be done to rule out more serious types of fungal sinus disease. How is this treated? Treatment for a sinus infection depends on the cause and whether your condition is chronic or acute. If caused by a virus, your symptoms should go away on their own within 10 days. You may be given medicines to relieve symptoms. They include: Medicines that shrink swollen nasal passages (decongestants). A spray that eases inflammation of the nostrils (topical intranasal corticosteroids). Rinses that help get rid of thick mucus in your nose (nasal saline washes). Medicines that treat allergies (antihistamines). Over-the-counter pain relievers. If caused by bacteria, your health care provider may recommend waiting to see if your symptoms improve. Most bacterial infections will get better without antibiotic medicine. You may be given antibiotics if you have: A severe infection. A weak immune system. If caused by narrow nasal passages or nasal polyps, surgery may be needed. Follow these instructions at home: Medicines Take, use, or apply over-the-counter and prescription medicines only as told by your health care provider. These may include nasal sprays. If you were prescribed an antibiotic medicine, take it as told by your health care provider. Do not stop taking the antibiotic even if you start to feel better. Hydrate and humidify  Drink enough fluid to keep your urine pale yellow. Staying hydrated will help to thin your mucus. Use a cool mist humidifier to keep the humidity level in your home above 50%. Inhale steam for 10-15 minutes, 3-4 times a day, or as told by  your health care provider. You can do this in the bathroom while a hot shower is running. Limit your exposure to cool or dry air. Rest Rest as much as possible. Sleep with your head raised (elevated). Make sure you get enough sleep each night. General instructions  Apply a warm, moist washcloth to your face 3-4 times a day or as told by your health care provider. This will help with discomfort. Use nasal saline washes as often as told by your health care provider. Wash your hands often with soap and water to reduce your exposure to germs. If soap and water are not available, use hand sanitizer. Do not smoke. Avoid being around people who are smoking (secondhand smoke). Keep all follow-up visits. This is important. Contact a health care provider if: You have a fever. Your symptoms get worse. Your symptoms do not improve within 10 days. Get help right away if: You have a severe headache. You have persistent vomiting. You have severe pain or swelling around your face or eyes. You have vision problems. You develop confusion. Your neck is stiff. You have trouble breathing. These symptoms may be an emergency. Get help right away. Call 911. Do not wait to see if the symptoms  will go away. Do not drive yourself to the hospital. Summary A sinus infection is soreness and inflammation of your sinuses. Sinuses are hollow spaces in the bones around your face. This condition is caused by nasal tissues that become inflamed or swollen. The swelling traps or blocks the flow of mucus. This allows bacteria, viruses, and fungi to grow, which leads to infection. If you were prescribed an antibiotic medicine, take it as told by your health care provider. Do not stop taking the antibiotic even if you start to feel better. Keep all follow-up visits. This is important. This information is not intended to replace advice given to you by your health care provider. Make sure you discuss any questions you have  with your health care provider. Document Revised: 05/25/2021 Document Reviewed: 05/25/2021 Elsevier Patient Education  [place].    If you have been instructed to have an in-person evaluation today at a local Urgent Care facility, please use the link below. It will take you to a list of all of our available [facility], including address, phone number and hours of operation. Please do not delay care.  [facility]  If you or a family member do not have a primary care provider, use the link below to schedule a visit and establish care. When you choose a [facility] primary care physician or advanced practice provider, you gain a long-term partner in health. Find a Primary Care Provider  Learn more about [facility]'s in-office and virtual care options: [facility]

## 2023-10-09 ENCOUNTER — Other Ambulatory Visit: Payer: Self-pay | Admitting: Nurse Practitioner

## 2023-10-09 DIAGNOSIS — Z0000 Encounter for general adult medical examination without abnormal findings: Secondary | ICD-10-CM

## 2023-11-06 ENCOUNTER — Ambulatory Visit
Admission: RE | Admit: 2023-11-06 | Discharge: 2023-11-06 | Disposition: A | Discharge status: Home / Self Care | Source: Ambulatory Visit | Attending: Nurse Practitioner | Admitting: Nurse Practitioner

## 2023-11-06 DIAGNOSIS — Z0000 Encounter for general adult medical examination without abnormal findings: Secondary | ICD-10-CM

## 2024-07-31 ENCOUNTER — Encounter: Payer: Self-pay | Admitting: Internal Medicine

## 2024-07-31 ENCOUNTER — Ambulatory Visit: Payer: Self-pay

## 2024-07-31 ENCOUNTER — Telehealth (INDEPENDENT_AMBULATORY_CARE_PROVIDER_SITE_OTHER): Admitting: Internal Medicine

## 2024-07-31 VITALS — Ht 69.0 in | Wt 175.0 lb

## 2024-07-31 DIAGNOSIS — J111 Influenza due to unidentified influenza virus with other respiratory manifestations: Secondary | ICD-10-CM | POA: Diagnosis not present

## 2024-07-31 DIAGNOSIS — J22 Unspecified acute lower respiratory infection: Secondary | ICD-10-CM

## 2024-07-31 MED ORDER — AZITHROMYCIN 250 MG PO TABS: ORAL_TABLET | ORAL | 0 refills | Status: AC

## 2024-07-31 NOTE — Telephone Encounter (Signed)
 FYI Only or Action Required?: FYI only for provider: appointment scheduled on 1/28.  Patient was last seen in primary care on 09/13/2022 by Vivienne Delon HERO, PA-C.  Called Nurse Triage reporting Cough.  Symptoms began a week ago.  Interventions attempted: OTC medications: ibuprofen .  Symptoms are: gradually improving.  Triage Disposition: See HCP Within 4 Hours (Or PCP Triage)  Patient/caregiver understands and will follow disposition?: Yes     1 week ago onset of all the flu like symptoms and fever. Daughter positive for the flu. Believes she may have the flu as well. Symptoms have improved but still with fatigue and cough with brown mucus. No blood. No CP. Has some mild SOB even when not coughing. No fever. Scheduled virtual appt with different provider at home office d/t no PCP availability within timeframe. Advised UC or ED for worsening symptoms.      Message from Ahlexyia S sent at 07/31/2024 11:58 AM EST  Reason for Triage: Pt called in stating that is experiencing fatigue, low energy, and worsening coughing. Pt stated she was experiencing body aches, high fever and sore throat last week. Pt mentioned that when she coughs up mucus it is now brown. Denied any other symptoms at this time. Warm transferred to nurse triage.   Reason for Disposition  [1] MILD difficulty breathing (e.g., minimal/no SOB at rest, SOB with walking, pulse < 100) AND [2] still present when not coughing  Answer Assessment - Initial Assessment Questions 1. ONSET: When did the cough begin?      1 week ago  2. SEVERITY: How bad is the cough today?      Moderate  3. SPUTUM: Describe the color of your sputum (e.g., none, dry cough; clear, white, yellow, green)     Brown  4. HEMOPTYSIS: Are you coughing up any blood? If Yes, ask: How much? (e.g., flecks, streaks, tablespoons, etc.)     No  5. DIFFICULTY BREATHING: Are you having difficulty breathing? If Yes, ask: How bad is it?  (e.g., mild, moderate, severe)      Mild even when not coughing  6. FEVER: Do you have a fever? If Yes, ask: What is your temperature, how was it measured, and when did it start?     None currently  7. CARDIAC HISTORY: Do you have any history of heart disease? (e.g., heart attack, congestive heart failure)      No  8. LUNG HISTORY: Do you have any history of lung disease?  (e.g., pulmonary embolus, asthma, emphysema)     No  9. PE RISK FACTORS: Do you have a history of blood clots? (or: recent major surgery, recent prolonged travel, bedridden)     Denies  10. OTHER SYMPTOMS: Do you have any other symptoms? (e.g., runny nose, wheezing, chest pain)       Fatigue  11. PREGNANCY: Is there any chance you are pregnant? When was your last menstrual period?       No.   12. TRAVEL: Have you traveled out of the country in the last month? (e.g., travel history, exposures)       No  Protocols used: Cough - Acute Productive-A-AH

## 2024-07-31 NOTE — Progress Notes (Signed)
 " [facility] [address] [place] [address] Dept: [phone] Dept Fax: [phone]  Virtual Video Visit  I connected with Sarah Parks Search on 07/31/24 at  3:00 PM EST by a video enabled telemedicine application and verified that I am speaking with the correct person using two identifiers.   Location patient: Home Location provider: Clinic Total time: 8 minutes Persons participating in the virtual visit: Patient; Avelina Finder CMA; Rosina Senters, FNP-C  I discussed the limitations of evaluation and management by telemedicine and the availability of in-person appointments. The patient expressed understanding and agreed to proceed.  Chief Complaint  Patient presents with   Extremity Weakness    1/16 daughter tested positive, Cough mucus brown    SUBJECTIVE:  HPI:   History of Present Illness   Sarah Parks is a 44 year old female who presents with persistent fatigue and respiratory symptoms following a flu infection.  Her symptoms began last Friday after her daughter was diagnosed with the flu earlier in the week. She experienced severe fatigue, body aches, and difficulty staying hydrated, with minimal food intake. She was bedridden from Friday to Sunday, with some improvement noted by Sunday.  On Monday, she attempted to resume normal activities but felt faint while walking around [place]. She returned to work on Tuesday but continues to experience significant fatigue and low energy levels.  She describes her current symptoms as including brown mucus production and chest congestion. She experiences difficulty taking deep breaths but denies any chest pain or general difficulty breathing, except when attempting deep breaths.No wheezing.   No fever since Saturday and no history of asthma. For symptom relief, she has been taking ibuprofen  for body aches and using liquid IV hydration packs. She has not taken any other  medications for her symptoms.       The following portions of the patient's history were reviewed and updated as appropriate: medical history, surgical history, medications, allergies, social history, and family history.    Past Medical History:  Diagnosis Date   Anemia    Cholestasis (HCC)    SVD (spontaneous vaginal delivery)    x 2   Past Surgical History:  Procedure Laterality Date   BREAST BIOPSY Left    Fibroid tissue 2003   DILATION AND EVACUATION N/A 05/14/2014   Procedure: DILATATION AND EVACUATION;  Surgeon: Krystal Deaner, MD;  Location: [facility];  Service: Gynecology;  Laterality: N/A;   DILATION AND EVACUATION N/A 09/27/2017   Procedure: DILATATION AND EVACUATION;  Surgeon: Deaner Krystal, MD;  Location: [facility];  Service: Gynecology;  Laterality: N/A;   INDUCED ABORTION     LAPAROSCOPIC BILATERAL SALPINGECTOMY Bilateral 09/27/2017   Procedure: LAPAROSCOPIC BILATERAL SALPINGECTOMY;  Surgeon: Deaner Krystal, MD;  Location: [facility];  Service: Gynecology;  Laterality: Bilateral;   WISDOM TOOTH EXTRACTION      Current Medications[1] Allergies[2]  Social History   Socioeconomic History   Marital status: Married    Spouse name: Not on file   Number of children: Not on file   Years of education: Not on file   Highest education level: Not on file  Occupational History   Not on file  Tobacco Use   Smoking status: Never   Smokeless tobacco: Never  Vaping Use   Vaping status: Never Used  Substance and Sexual Activity   Alcohol use: Yes    Alcohol/week: 3.0 standard drinks of alcohol    Types: 1 Cans of beer, 2 Shots of  liquor per week   Drug use: Not Currently    Types: Marijuana   Sexual activity: Yes    Birth control/protection: None  Other Topics Concern   Not on file  Social History Narrative   ** Merged History Encounter **       Social Drivers of Health   Tobacco Use: Low Risk (07/31/2024)   Patient History    Smoking Tobacco Use: Never    Smokeless  Tobacco Use: Never    Passive Exposure: Not on file  Financial Resource Strain: Not on file  Food Insecurity: Not on file  Transportation Needs: Not on file  Physical Activity: Not on file  Stress: Not on file  Social Connections: Not on file  Intimate Partner Violence: Not on file  Depression (PHQ2-9): High Risk (07/31/2024)   Depression (PHQ2-9)    PHQ-2 Score: 13  Alcohol Screen: Not on file  Housing: Not on file  Utilities: Not on file  Health Literacy: Not on file    Family History  Problem Relation Age of Onset   Hypertension Brother    Dementia Maternal Grandmother    Cancer Maternal Grandfather        unknown   Breast cancer Mother 3       second dx at 63   HIV/AIDS Father    Cancer Maternal Uncle        unknown dx   Breast cancer Other        MGF's sister   Colon cancer Neg Hx      ROS: A complete ROS was performed with pertinent positives/negatives noted in the HPI. The remainder of the ROS are negative.    OBJECTIVE:  VITALS per patient if applicable: Today's Vitals   07/31/24 1430  Weight: 175 lb (79.4 kg)  Height: 5' 9 (1.753 m)   Body mass index is 25.84 kg/m.   GENERAL: Alert and oriented. Appears well and in no acute distress. HEENT: Atraumatic. Conjunctiva clear. No obvious abnormalities on inspection of external nose and ears. NECK: Normal movements of the head and neck. LUNGS: On inspection, no signs of respiratory distress. Breathing rate appears normal. No obvious gross SOB, gasping or wheezing, and no conversational dyspnea.(+) productive cough CV: No obvious cyanosis. MS: Moves all visible extremities without noticeable abnormality. PSYCH/NEURO: Pleasant and cooperative. No obvious depression or anxiety. Speech and thought processing grossly intact.  ASSESSMENT AND PLAN: Assessment and Plan    Influenza with complication of acute lower respiratory infection  Symptoms suggest flu with risk of bacterial infection.  - Prescribed  azithromycin  (Z-Pak)  - Advised hydration with liquid IV packs and solutions. - Recommended Mucinex DM for cough and mucus. - Suggested Tylenol  or ibuprofen  for body aches. - Instructed rest and symptom monitoring, seek evaluation if worsening.         I discussed the assessment and treatment plan with the patient. The patient was provided an opportunity to ask questions and all were answered. The patient agreed with the plan and demonstrated an understanding of the instructions.   The patient was advised to call back or seek an in-person evaluation if the symptoms worsen or if the condition fails to improve as anticipated.  Return if symptoms worsen or fail to improve.  Rosina Senters, FNP      [1]  Current Outpatient Medications:    azithromycin  (ZITHROMAX ) 250 MG tablet, Take 2 tablets on day 1, then 1 tablet daily on days 2 through 5, Disp: 6 tablet, Rfl: 0  fluticasone  (FLONASE  SENSIMIST) 27.5 MCG/SPRAY nasal spray, Place 2 sprays into the nose daily., Disp: 10 g, Rfl: 0   Multiple Vitamin (MULTIVITAMIN) tablet, Take 1 tablet by mouth daily., Disp: , Rfl:  [2] No Known Allergies  "
